# Patient Record
Sex: Female | Born: 1940 | Race: Black or African American | Hispanic: No | Smoking: Former smoker
Health system: Southern US, Community
[De-identification: ages and names within clinical notes are randomized; demographics above are authoritative.]

## PROBLEM LIST (undated history)

## (undated) ENCOUNTER — Ambulatory Visit (HOSPITAL_COMMUNITY): Admission: EM

## (undated) DIAGNOSIS — F419 Anxiety disorder, unspecified: Secondary | ICD-10-CM

## (undated) DIAGNOSIS — M1990 Unspecified osteoarthritis, unspecified site: Secondary | ICD-10-CM

## (undated) DIAGNOSIS — K219 Gastro-esophageal reflux disease without esophagitis: Secondary | ICD-10-CM

## (undated) DIAGNOSIS — T7840XA Allergy, unspecified, initial encounter: Secondary | ICD-10-CM

## (undated) DIAGNOSIS — R12 Heartburn: Secondary | ICD-10-CM

## (undated) DIAGNOSIS — K449 Diaphragmatic hernia without obstruction or gangrene: Secondary | ICD-10-CM

## (undated) DIAGNOSIS — K635 Polyp of colon: Secondary | ICD-10-CM

## (undated) DIAGNOSIS — I10 Essential (primary) hypertension: Secondary | ICD-10-CM

## (undated) DIAGNOSIS — R140 Abdominal distension (gaseous): Secondary | ICD-10-CM

## (undated) DIAGNOSIS — E119 Type 2 diabetes mellitus without complications: Secondary | ICD-10-CM

## (undated) DIAGNOSIS — H269 Unspecified cataract: Secondary | ICD-10-CM

## (undated) DIAGNOSIS — E559 Vitamin D deficiency, unspecified: Secondary | ICD-10-CM

## (undated) DIAGNOSIS — K5730 Diverticulosis of large intestine without perforation or abscess without bleeding: Secondary | ICD-10-CM

## (undated) HISTORY — DX: Diaphragmatic hernia without obstruction or gangrene: K44.9

## (undated) HISTORY — PX: CATARACT EXTRACTION: SUR2

## (undated) HISTORY — DX: Allergy, unspecified, initial encounter: T78.40XA

## (undated) HISTORY — DX: Heartburn: R12

## (undated) HISTORY — DX: Unspecified osteoarthritis, unspecified site: M19.90

## (undated) HISTORY — PX: COLONOSCOPY: SHX174

## (undated) HISTORY — DX: Abdominal distension (gaseous): R14.0

## (undated) HISTORY — DX: Anxiety disorder, unspecified: F41.9

## (undated) HISTORY — PX: DILATION AND CURETTAGE OF UTERUS: SHX78

## (undated) HISTORY — DX: Vitamin D deficiency, unspecified: E55.9

## (undated) HISTORY — DX: Unspecified cataract: H26.9

## (undated) HISTORY — PX: POLYPECTOMY: SHX149

## (undated) HISTORY — DX: Polyp of colon: K63.5

## (undated) HISTORY — DX: Diverticulosis of large intestine without perforation or abscess without bleeding: K57.30

## (undated) HISTORY — DX: Gastro-esophageal reflux disease without esophagitis: K21.9

## (undated) HISTORY — DX: Type 2 diabetes mellitus without complications: E11.9

## (undated) HISTORY — DX: Essential (primary) hypertension: I10

---

## 1963-05-07 HISTORY — PX: OOPHORECTOMY: SHX86

## 1969-05-06 HISTORY — PX: OOPHORECTOMY: SHX86

## 1999-04-25 ENCOUNTER — Encounter: Admission: RE | Admit: 1999-04-25 | Discharge: 1999-04-25 | Payer: Self-pay | Admitting: Family Medicine

## 1999-04-25 ENCOUNTER — Encounter: Payer: Self-pay | Admitting: Family Medicine

## 2000-05-29 ENCOUNTER — Encounter: Admission: RE | Admit: 2000-05-29 | Discharge: 2000-05-29 | Payer: Self-pay | Admitting: Internal Medicine

## 2000-05-29 ENCOUNTER — Encounter: Payer: Self-pay | Admitting: Internal Medicine

## 2000-08-07 ENCOUNTER — Inpatient Hospital Stay: Admission: AD | Admit: 2000-08-07 | Discharge: 2000-08-07 | Payer: Self-pay | Admitting: Obstetrics & Gynecology

## 2001-03-16 ENCOUNTER — Other Ambulatory Visit: Admission: RE | Admit: 2001-03-16 | Discharge: 2001-03-16 | Payer: Self-pay | Admitting: *Deleted

## 2001-08-17 ENCOUNTER — Encounter: Payer: Self-pay | Admitting: Family Medicine

## 2001-08-17 ENCOUNTER — Encounter: Admission: RE | Admit: 2001-08-17 | Discharge: 2001-08-17 | Payer: Self-pay | Admitting: Family Medicine

## 2002-12-08 ENCOUNTER — Ambulatory Visit (HOSPITAL_COMMUNITY): Admission: RE | Admit: 2002-12-08 | Discharge: 2002-12-08 | Payer: Self-pay | Admitting: Family Medicine

## 2002-12-08 ENCOUNTER — Encounter: Payer: Self-pay | Admitting: Family Medicine

## 2003-12-15 ENCOUNTER — Ambulatory Visit (HOSPITAL_COMMUNITY): Admission: RE | Admit: 2003-12-15 | Discharge: 2003-12-15 | Payer: Self-pay | Admitting: Internal Medicine

## 2005-03-06 ENCOUNTER — Ambulatory Visit: Payer: Self-pay | Admitting: Family Medicine

## 2005-03-20 ENCOUNTER — Ambulatory Visit: Payer: Self-pay | Admitting: Family Medicine

## 2005-04-24 ENCOUNTER — Ambulatory Visit (HOSPITAL_COMMUNITY): Admission: RE | Admit: 2005-04-24 | Discharge: 2005-04-24 | Payer: Self-pay | Admitting: Family Medicine

## 2005-05-30 ENCOUNTER — Ambulatory Visit: Payer: Self-pay | Admitting: Family Medicine

## 2005-05-30 ENCOUNTER — Other Ambulatory Visit: Admission: RE | Admit: 2005-05-30 | Discharge: 2005-05-30 | Payer: Self-pay | Admitting: Family Medicine

## 2005-05-30 ENCOUNTER — Encounter: Payer: Self-pay | Admitting: Family Medicine

## 2005-08-08 ENCOUNTER — Ambulatory Visit: Payer: Self-pay | Admitting: Gastroenterology

## 2005-08-21 ENCOUNTER — Ambulatory Visit: Payer: Self-pay | Admitting: Gastroenterology

## 2005-08-21 ENCOUNTER — Encounter (INDEPENDENT_AMBULATORY_CARE_PROVIDER_SITE_OTHER): Payer: Self-pay | Admitting: Specialist

## 2005-11-21 ENCOUNTER — Emergency Department (HOSPITAL_COMMUNITY): Admission: EM | Admit: 2005-11-21 | Discharge: 2005-11-21 | Payer: Self-pay | Admitting: Emergency Medicine

## 2006-04-24 ENCOUNTER — Ambulatory Visit: Payer: Self-pay | Admitting: Family Medicine

## 2006-06-05 ENCOUNTER — Ambulatory Visit: Payer: Self-pay | Admitting: Family Medicine

## 2006-06-05 LAB — CONVERTED CEMR LAB
CO2: 27 meq/L (ref 19–32)
Chloride: 107 meq/L (ref 96–112)
Cholesterol: 190 mg/dL (ref 0–200)
Creatinine, Ser: 0.6 mg/dL (ref 0.4–1.2)
GFR calc Af Amer: 130 mL/min
GFR calc non Af Amer: 107 mL/min
LDL Cholesterol: 131 mg/dL — ABNORMAL HIGH (ref 0–99)
Sodium: 143 meq/L (ref 135–145)
Total CHOL/HDL Ratio: 4.8

## 2006-06-10 ENCOUNTER — Ambulatory Visit: Payer: Self-pay | Admitting: Family Medicine

## 2006-08-07 ENCOUNTER — Encounter: Payer: Self-pay | Admitting: Family Medicine

## 2006-08-07 ENCOUNTER — Ambulatory Visit: Payer: Self-pay | Admitting: Family Medicine

## 2006-08-07 ENCOUNTER — Other Ambulatory Visit: Admission: RE | Admit: 2006-08-07 | Discharge: 2006-08-07 | Payer: Self-pay | Admitting: Family Medicine

## 2006-08-07 LAB — CONVERTED CEMR LAB: Pap Smear: NORMAL

## 2006-08-13 ENCOUNTER — Ambulatory Visit (HOSPITAL_COMMUNITY): Admission: RE | Admit: 2006-08-13 | Discharge: 2006-08-13 | Payer: Self-pay | Admitting: Family Medicine

## 2006-10-03 ENCOUNTER — Telehealth (INDEPENDENT_AMBULATORY_CARE_PROVIDER_SITE_OTHER): Payer: Self-pay | Admitting: *Deleted

## 2006-10-07 ENCOUNTER — Encounter: Payer: Self-pay | Admitting: Family Medicine

## 2006-10-07 DIAGNOSIS — K5730 Diverticulosis of large intestine without perforation or abscess without bleeding: Secondary | ICD-10-CM | POA: Insufficient documentation

## 2006-10-07 DIAGNOSIS — Z8639 Personal history of other endocrine, nutritional and metabolic disease: Secondary | ICD-10-CM | POA: Insufficient documentation

## 2006-10-07 DIAGNOSIS — I10 Essential (primary) hypertension: Secondary | ICD-10-CM

## 2006-10-07 DIAGNOSIS — E669 Obesity, unspecified: Secondary | ICD-10-CM

## 2006-10-07 DIAGNOSIS — K5900 Constipation, unspecified: Secondary | ICD-10-CM | POA: Insufficient documentation

## 2006-10-07 DIAGNOSIS — E7800 Pure hypercholesterolemia, unspecified: Secondary | ICD-10-CM | POA: Insufficient documentation

## 2006-10-08 ENCOUNTER — Ambulatory Visit: Payer: Self-pay | Admitting: Family Medicine

## 2006-11-14 ENCOUNTER — Ambulatory Visit: Payer: Self-pay | Admitting: Family Medicine

## 2006-11-14 LAB — CONVERTED CEMR LAB
GFR calc Af Amer: 129 mL/min
GFR calc non Af Amer: 107 mL/min
Potassium: 4.4 meq/L (ref 3.5–5.1)
Sodium: 138 meq/L (ref 135–145)

## 2006-11-18 ENCOUNTER — Ambulatory Visit: Payer: Self-pay | Admitting: Family Medicine

## 2006-11-18 DIAGNOSIS — R252 Cramp and spasm: Secondary | ICD-10-CM

## 2006-12-02 ENCOUNTER — Encounter (INDEPENDENT_AMBULATORY_CARE_PROVIDER_SITE_OTHER): Payer: Self-pay | Admitting: *Deleted

## 2007-06-17 ENCOUNTER — Emergency Department (HOSPITAL_COMMUNITY): Admission: EM | Admit: 2007-06-17 | Discharge: 2007-06-17 | Payer: Self-pay | Admitting: Emergency Medicine

## 2007-06-17 ENCOUNTER — Encounter: Payer: Self-pay | Admitting: Family Medicine

## 2007-06-29 ENCOUNTER — Ambulatory Visit: Payer: Self-pay | Admitting: Family Medicine

## 2007-07-08 ENCOUNTER — Telehealth: Payer: Self-pay | Admitting: Family Medicine

## 2007-07-21 ENCOUNTER — Encounter: Payer: Self-pay | Admitting: Family Medicine

## 2007-07-21 ENCOUNTER — Telehealth: Payer: Self-pay | Admitting: Family Medicine

## 2007-07-22 ENCOUNTER — Ambulatory Visit: Payer: Self-pay | Admitting: Family Medicine

## 2007-07-24 LAB — CONVERTED CEMR LAB
CO2: 27 meq/L (ref 19–32)
Calcium: 9.5 mg/dL (ref 8.4–10.5)
Chloride: 108 meq/L (ref 96–112)
Creatinine, Ser: 0.7 mg/dL (ref 0.4–1.2)
GFR calc non Af Amer: 90 mL/min
Glucose, Bld: 97 mg/dL (ref 70–99)

## 2007-08-04 ENCOUNTER — Ambulatory Visit: Payer: Self-pay | Admitting: Family Medicine

## 2007-09-10 ENCOUNTER — Ambulatory Visit: Payer: Self-pay | Admitting: Family Medicine

## 2007-09-10 LAB — CONVERTED CEMR LAB
CO2: 26 meq/L (ref 19–32)
Calcium: 9.7 mg/dL (ref 8.4–10.5)
Chloride: 110 meq/L (ref 96–112)
Creatinine, Ser: 0.8 mg/dL (ref 0.4–1.2)
GFR calc non Af Amer: 77 mL/min
Potassium: 4.4 meq/L (ref 3.5–5.1)

## 2007-09-17 ENCOUNTER — Ambulatory Visit: Payer: Self-pay | Admitting: Family Medicine

## 2007-11-16 ENCOUNTER — Ambulatory Visit: Payer: Self-pay | Admitting: Family Medicine

## 2007-11-23 ENCOUNTER — Encounter (INDEPENDENT_AMBULATORY_CARE_PROVIDER_SITE_OTHER): Payer: Self-pay | Admitting: Internal Medicine

## 2007-12-02 ENCOUNTER — Encounter (INDEPENDENT_AMBULATORY_CARE_PROVIDER_SITE_OTHER): Payer: Self-pay | Admitting: *Deleted

## 2007-12-31 ENCOUNTER — Ambulatory Visit: Payer: Self-pay | Admitting: Family Medicine

## 2008-01-04 LAB — CONVERTED CEMR LAB
CO2: 26 meq/L (ref 19–32)
GFR calc non Af Amer: 89 mL/min
Hgb A1c MFr Bld: 6.2 % — ABNORMAL HIGH (ref 4.6–6.0)
Sodium: 143 meq/L (ref 135–145)

## 2008-01-05 HISTORY — PX: DILATION AND CURETTAGE OF UTERUS: SHX78

## 2008-01-07 ENCOUNTER — Ambulatory Visit: Payer: Self-pay | Admitting: Family Medicine

## 2008-01-28 ENCOUNTER — Encounter (INDEPENDENT_AMBULATORY_CARE_PROVIDER_SITE_OTHER): Payer: Self-pay | Admitting: Obstetrics and Gynecology

## 2008-01-28 ENCOUNTER — Ambulatory Visit (HOSPITAL_COMMUNITY): Admission: RE | Admit: 2008-01-28 | Discharge: 2008-01-28 | Payer: Self-pay | Admitting: Obstetrics and Gynecology

## 2008-03-15 ENCOUNTER — Ambulatory Visit: Payer: Self-pay | Admitting: Family Medicine

## 2008-03-15 DIAGNOSIS — R609 Edema, unspecified: Secondary | ICD-10-CM

## 2008-05-19 ENCOUNTER — Telehealth: Payer: Self-pay | Admitting: Family Medicine

## 2008-05-23 ENCOUNTER — Telehealth: Payer: Self-pay | Admitting: Family Medicine

## 2008-06-01 ENCOUNTER — Ambulatory Visit: Payer: Self-pay | Admitting: Family Medicine

## 2008-06-01 DIAGNOSIS — N959 Unspecified menopausal and perimenopausal disorder: Secondary | ICD-10-CM | POA: Insufficient documentation

## 2008-06-01 LAB — CONVERTED CEMR LAB
Alkaline Phosphatase: 67 units/L (ref 39–117)
BUN: 11 mg/dL (ref 6–23)
Basophils Relative: 1.6 % (ref 0.0–3.0)
Bilirubin, Direct: 0.1 mg/dL (ref 0.0–0.3)
CO2: 27 meq/L (ref 19–32)
Chloride: 110 meq/L (ref 96–112)
Cholesterol: 199 mg/dL (ref 0–200)
Eosinophils Relative: 1.6 % (ref 0.0–5.0)
GFR calc Af Amer: 108 mL/min
HDL: 47.2 mg/dL (ref >39.0)
Hgb A1c MFr Bld: 6.4 % — ABNORMAL HIGH (ref 4.6–6.0)
Lymphocytes Relative: 52.6 % — ABNORMAL HIGH (ref 12.0–46.0)
MCHC: 33.8 g/dL (ref 30.0–36.0)
MCV: 85.6 fL (ref 78.0–100.0)
Microalb, Ur: 2.3 mg/dL — ABNORMAL HIGH (ref 0.0–1.9)
Neutrophils Relative %: 33.7 % — ABNORMAL LOW (ref 43.0–77.0)
Platelets: 226 10*3/uL (ref 150–400)
RDW: 12.6 % (ref 11.5–14.6)
Total Bilirubin: 0.6 mg/dL (ref 0.3–1.2)
Total CHOL/HDL Ratio: 4.2
Triglycerides: 93 mg/dL (ref 0–149)
VLDL: 19 mg/dL (ref 0–40)
WBC: 5.9 10*3/uL (ref 4.5–10.5)

## 2008-06-02 LAB — CONVERTED CEMR LAB: Vit D, 1,25-Dihydroxy: 14 — ABNORMAL LOW (ref 30–89)

## 2008-06-08 ENCOUNTER — Ambulatory Visit: Payer: Self-pay | Admitting: Family Medicine

## 2008-06-08 DIAGNOSIS — E039 Hypothyroidism, unspecified: Secondary | ICD-10-CM | POA: Insufficient documentation

## 2008-06-09 ENCOUNTER — Telehealth: Payer: Self-pay | Admitting: Family Medicine

## 2008-06-10 ENCOUNTER — Telehealth: Payer: Self-pay | Admitting: Family Medicine

## 2008-06-13 ENCOUNTER — Encounter: Payer: Self-pay | Admitting: Family Medicine

## 2008-06-15 ENCOUNTER — Ambulatory Visit (HOSPITAL_COMMUNITY): Admission: RE | Admit: 2008-06-15 | Discharge: 2008-06-15 | Payer: Self-pay | Admitting: Family Medicine

## 2008-06-16 ENCOUNTER — Encounter (INDEPENDENT_AMBULATORY_CARE_PROVIDER_SITE_OTHER): Payer: Self-pay | Admitting: *Deleted

## 2008-06-22 ENCOUNTER — Telehealth: Payer: Self-pay | Admitting: Family Medicine

## 2008-07-14 ENCOUNTER — Encounter: Admission: RE | Admit: 2008-07-14 | Discharge: 2008-07-14 | Payer: Self-pay | Admitting: Surgery

## 2008-07-14 ENCOUNTER — Encounter: Payer: Self-pay | Admitting: Family Medicine

## 2008-07-21 ENCOUNTER — Ambulatory Visit: Payer: Self-pay | Admitting: Family Medicine

## 2008-08-09 ENCOUNTER — Emergency Department (HOSPITAL_COMMUNITY): Admission: EM | Admit: 2008-08-09 | Discharge: 2008-08-09 | Payer: Self-pay | Admitting: Physician Assistant

## 2008-10-19 ENCOUNTER — Ambulatory Visit: Payer: Self-pay | Admitting: Family Medicine

## 2008-10-20 LAB — CONVERTED CEMR LAB: Free T4: 0.8 ng/dL (ref 0.6–1.6)

## 2008-11-29 ENCOUNTER — Ambulatory Visit: Payer: Self-pay | Admitting: Family Medicine

## 2009-03-13 ENCOUNTER — Telehealth: Payer: Self-pay | Admitting: Family Medicine

## 2009-03-14 ENCOUNTER — Encounter: Payer: Self-pay | Admitting: Family Medicine

## 2009-03-15 ENCOUNTER — Ambulatory Visit: Payer: Self-pay | Admitting: Family Medicine

## 2009-03-15 DIAGNOSIS — K219 Gastro-esophageal reflux disease without esophagitis: Secondary | ICD-10-CM

## 2009-03-20 ENCOUNTER — Encounter: Payer: Self-pay | Admitting: Family Medicine

## 2009-03-28 ENCOUNTER — Telehealth: Payer: Self-pay | Admitting: Family Medicine

## 2009-03-28 LAB — CONVERTED CEMR LAB: 5-HIAA, 24 Hr Urine: 4.3 mg/(24.h) (ref <=6.0)

## 2009-06-22 ENCOUNTER — Telehealth: Payer: Self-pay | Admitting: Family Medicine

## 2009-12-12 ENCOUNTER — Encounter (INDEPENDENT_AMBULATORY_CARE_PROVIDER_SITE_OTHER): Payer: Self-pay | Admitting: *Deleted

## 2010-04-26 ENCOUNTER — Ambulatory Visit (HOSPITAL_COMMUNITY)
Admission: RE | Admit: 2010-04-26 | Discharge: 2010-04-26 | Payer: Self-pay | Source: Home / Self Care | Attending: Family Medicine | Admitting: Family Medicine

## 2010-04-26 LAB — HM MAMMOGRAPHY: HM Mammogram: NORMAL

## 2010-05-27 ENCOUNTER — Encounter: Payer: Self-pay | Admitting: Family Medicine

## 2010-06-05 NOTE — Letter (Signed)
Summary: Nadara Eaton letter  [facility]  [address] [place], [place] [address]   Phone: [phone]  Fax: [phone]       12/12/2009 MRN: [id number]  [facility] [address] [place], [place]  [address]  Dear Ms. [facility] [place] and [place] announce the retirement of Arta Silence, M.D., from full-time practice at the [facility] office effective November 02, 2009 and his plans of returning part-time.  It is important to Dr. Hetty Ely and to our practice that you understand that [facility] has seven physicians in our office for your health care needs.  We will continue to offer the same exceptional care that you have today.    Dr. Hetty Ely has spoken to many of you about his plans for retirement and returning part-time in the fall.   We will continue to work with you through the transition to schedule appointments for you in the office and meet the high standards that [facility] is committed to.   Again, it is with great pleasure that we share the news that Dr. Hetty Ely will return to [facility] in October of 2011 with a reduced schedule.    If you have any questions, or would like to request an appointment with one of our physicians, please call us at [phone] and press the option for Scheduling an appointment.  We take pleasure in providing you with excellent patient care and look forward to seeing you at your next office visit.  Our [facility] Physicians are:  Tillman Abide, M.D. Laurita Quint, M.D. Roxy Manns, M.D. Kerby Nora, M.D. Hannah Beat, M.D. Ruthe Mannan, M.D. We proudly welcomed Raechel Ache, M.D. and Eustaquio Boyden, M.D. to the practice in July/August 2011.  Sincerely,  [facility]

## 2010-06-05 NOTE — Progress Notes (Signed)
Summary: Order for back brace ([employer])  Phone Note From Other Clinic Call back at [phone]   [name]: [employer] Call For: Dr. Hetty Ely Summary of Call: Received fax form requesting back brace.  Spoke with patient and she says that she is aware of this as well as Dr. Hetty Ely.  Please advise.  Form in your IN box.    Also, there is another form for diabetic supplies.  Form in your IN box. Initial call taken by: Linde Gillis CMA [facility]),  June 22, 2009 9:41 AM  Follow-up for Phone Call        Diabetic supplies form signed. Back brace form requires her being seen. Follow-up by: Shaune Leeks MD,  June 22, 2009 5:27 PM  Additional Follow-up for Phone Call Additional follow up Details #1::        Left message for patient to return call to schedule appt to see Dr. Hetty Ely.  Form for diabetic supplies faxed. Additional Follow-up by: Linde Gillis CMA [facility]),  June 23, 2009 8:14 AM

## 2010-06-07 DIAGNOSIS — E7800 Pure hypercholesterolemia, unspecified: Secondary | ICD-10-CM

## 2010-06-07 DIAGNOSIS — E119 Type 2 diabetes mellitus without complications: Secondary | ICD-10-CM

## 2010-06-07 DIAGNOSIS — D518 Other vitamin B12 deficiency anemias: Secondary | ICD-10-CM

## 2010-06-07 DIAGNOSIS — K5730 Diverticulosis of large intestine without perforation or abscess without bleeding: Secondary | ICD-10-CM

## 2010-06-07 DIAGNOSIS — E039 Hypothyroidism, unspecified: Secondary | ICD-10-CM

## 2010-06-07 DIAGNOSIS — I10 Essential (primary) hypertension: Secondary | ICD-10-CM

## 2010-06-14 ENCOUNTER — Other Ambulatory Visit (INDEPENDENT_AMBULATORY_CARE_PROVIDER_SITE_OTHER): Payer: Medicare Other

## 2010-06-14 ENCOUNTER — Encounter: Payer: Self-pay | Admitting: Family Medicine

## 2010-06-14 ENCOUNTER — Other Ambulatory Visit: Payer: Self-pay | Admitting: Family Medicine

## 2010-06-14 ENCOUNTER — Encounter (INDEPENDENT_AMBULATORY_CARE_PROVIDER_SITE_OTHER): Payer: Self-pay | Admitting: *Deleted

## 2010-06-14 DIAGNOSIS — K5730 Diverticulosis of large intestine without perforation or abscess without bleeding: Secondary | ICD-10-CM

## 2010-06-14 DIAGNOSIS — E785 Hyperlipidemia, unspecified: Secondary | ICD-10-CM

## 2010-06-14 DIAGNOSIS — E559 Vitamin D deficiency, unspecified: Secondary | ICD-10-CM

## 2010-06-14 DIAGNOSIS — E119 Type 2 diabetes mellitus without complications: Secondary | ICD-10-CM

## 2010-06-14 DIAGNOSIS — I10 Essential (primary) hypertension: Secondary | ICD-10-CM

## 2010-06-14 DIAGNOSIS — E039 Hypothyroidism, unspecified: Secondary | ICD-10-CM

## 2010-06-14 DIAGNOSIS — E7800 Pure hypercholesterolemia, unspecified: Secondary | ICD-10-CM

## 2010-06-14 DIAGNOSIS — R609 Edema, unspecified: Secondary | ICD-10-CM

## 2010-06-14 LAB — BASIC METABOLIC PANEL
BUN: 12 mg/dL (ref 6–23)
CO2: 26 mEq/L (ref 19–32)
Calcium: 9.7 mg/dL (ref 8.4–10.5)
Chloride: 102 mEq/L (ref 96–112)
Creatinine, Ser: 0.7 mg/dL (ref 0.4–1.2)
GFR: 116.63 mL/min (ref >60.00)
Glucose, Bld: 95 mg/dL (ref 70–99)
Potassium: 4.6 mEq/L (ref 3.5–5.1)
Sodium: 137 mEq/L (ref 135–145)

## 2010-06-14 LAB — HEMOGLOBIN A1C: Hgb A1c MFr Bld: 6.5 % (ref 4.6–6.5)

## 2010-06-14 LAB — VITAMIN B12: Vitamin B-12: 370 pg/mL (ref 211–911)

## 2010-06-14 LAB — CBC WITH DIFFERENTIAL/PLATELET
Basophils Absolute: 0 10*3/uL (ref 0.0–0.1)
Eosinophils Relative: 1.4 % (ref 0.0–5.0)
HCT: 38.1 % (ref 36.0–46.0)
Hemoglobin: 12.9 g/dL (ref 12.0–15.0)
Lymphocytes Relative: 40.4 % (ref 12.0–46.0)
Monocytes Absolute: 0.7 10*3/uL (ref 0.1–1.0)
Monocytes Relative: 10 % (ref 3.0–12.0)
Neutro Abs: 3.5 10*3/uL (ref 1.4–7.7)
Neutrophils Relative %: 47.9 % (ref 43.0–77.0)
RBC: 4.45 Mil/uL (ref 3.87–5.11)

## 2010-06-14 LAB — MICROALBUMIN / CREATININE URINE RATIO: Microalb, Ur: 8.4 mg/dL — ABNORMAL HIGH (ref 0.0–1.9)

## 2010-06-14 LAB — LIPID PANEL
Triglycerides: 99 mg/dL (ref 0.0–149.0)
VLDL: 19.8 mg/dL (ref 0.0–40.0)

## 2010-06-14 LAB — HEPATIC FUNCTION PANEL
Albumin: 4.1 g/dL (ref 3.5–5.2)
Total Protein: 7.8 g/dL (ref 6.0–8.3)

## 2010-06-20 ENCOUNTER — Encounter (INDEPENDENT_AMBULATORY_CARE_PROVIDER_SITE_OTHER): Payer: Medicare Other | Admitting: Family Medicine

## 2010-06-20 ENCOUNTER — Encounter: Payer: Self-pay | Admitting: Family Medicine

## 2010-06-20 DIAGNOSIS — IMO0002 Reserved for concepts with insufficient information to code with codable children: Secondary | ICD-10-CM | POA: Insufficient documentation

## 2010-06-20 DIAGNOSIS — Z0000 Encounter for general adult medical examination without abnormal findings: Secondary | ICD-10-CM

## 2010-06-27 NOTE — Assessment & Plan Note (Signed)
Summary: CPX/ CLE   Vital Signs:  Patient profile:   70 year old female Weight:      201.50 pounds BMI:     35.82 Temp:     98.6 degrees F oral Pulse rate:   92 / minute Pulse rhythm:   regular BP sitting:   144 / 80  (left arm) Cuff size:   large  Vitals Entered By: Sydell Axon LPN (June 20, 2010 2:55 PM) CC: 30 Minute checkup, sees Dr. Rosemary Holms for her GYN care  Vision Screening:Left eye with correction: 20 / 25 Right eye with correction: 20 / 25 Both eyes with correction: 20 / 25       25db HL: Left  500 hz: 25db 1000 hz: 25db 2000 hz: 25db 4000 hz: 25db Right  500 hz: 25db 1000 hz: 25db 2000 hz: 25db 4000 hz: 25db    History of Present Illness: Pt here for Comp Exam. She feels well with no complaints. She has stomach problems which bother her. She has been on Nexium in the past. She drinks lots of coffee and we discussed the effects of this.  She is often having jittery with figetting and can't sit still. This is all the time.  She continues with lipoma of upper left shoulder...couldn't be done because of other things going on. She would still like to have that done. She had seen Dr Daphine Deutscher.  Preventive Screening-Counseling & Management  Alcohol-Tobacco     Alcohol drinks/day: 0     Smoking Status: quit     Year Quit: 1975     Pack years: 10     Passive Smoke Exposure: no  Caffeine-Diet-Exercise     Caffeine use/day: 3 or more.     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 Take 1 Capsule By Mouth Once A Day 2)  Norvasc 10 Mg Tabs (Amlodipine Besylate) .... Take 1 Tablet By Mouth At Night 3)  Cleansing Blend With Fiber .... Takes 1 Tablet By Mouth Four Times A Day 4)  Metoprolol Succinate 100 Mg Xr24h-Tab (Metoprolol Succinate) .... One Tab By Mouth At Night 5)  Vitamin D 1000 Unit Tabs (Cholecalciferol) .Marland Kitchen.. 1 Tablet Twice A Day By Mouth 6)  Effexor Xr 37.5 Mg Xr24h-Cap (Venlafaxine Hcl) .... One Tab By Mouth Once Daily  Allergies: 1)  Asa  Past History:  Past Medical History: Last updated: 10/07/2006 Diabetes mellitus, type II Diverticulosis, colon Hypertension  Past Surgical History: Last updated: 06/08/2008  NSVD x 5 1 Miscarriage    D&C R Ovary removed due to cysts 1965 Ovarian Cystectomy L ooporectomy  1971 Colonoscopy- polyps, benign, divertics (08/21/2005)      5 yrs D&C for Vaginal Bleeding (Dr Rosemary Holms)  01/2008                                Family History: Last updated: 06/20/2010 Father: dec 61 MI, HTN Mother: dec 80- mult. problems, HTN Siblings: 5 brothers living  1 deceased from GI Bleed due to  PUD , 1 sister  living 2 passed, 1 from pneumonia with ETOH, other ETOH  Social History: Last updated: 06/08/2008 Marital Status: Married lives w/husb Children: foster daughter Occupation: retired  Risk Factors: Alcohol Use: 0 (06/20/2010) Caffeine Use: 3 or more. (06/20/2010) Exercise: no (06/20/2010)  Risk Factors: Smoking Status: quit (06/20/2010) Passive Smoke Exposure: no (06/20/2010)  Family History: Father: dec 61 MI, HTN Mother: dec 6- mult. problems, HTN Siblings: 5 brothers living  1 deceased from GI Bleed due to  PUD , 1  sister  living 2 passed, 1 from pneumonia with ETOH, other ETOH  Social History: Caffeine use/day:  3 or more. Does Patient Exercise:  no  Review of Systems General:  Denies chills, fatigue, fever, sweats, weakness, and weight loss. Eyes:  Denies blurring, discharge, and eye pain. ENT:  Denies decreased hearing, earache, and ringing in ears. CV:  Complains of palpitations and swelling of feet; denies chest pain or discomfort, fainting, fatigue, shortness of breath with exertion, and swelling of hands. Resp:  Denies cough, shortness of breath, and wheezing. GI:  Complains of constipation and indigestion; denies abdominal pain, bloody stools, change in bowel habits, dark tarry stools, diarrhea, loss of appetite, nausea, vomiting, vomiting blood, and yellowish skin color. GU:  Complains of nocturia; denies discharge, dysuria, and urinary frequency. MS:  Complains of joint pain and muscle aches; denies low back pain, cramps, and stiffness; generally . Derm:  Denies dryness, itching, and rash. Neuro:  Denies numbness, poor balance, tingling, and tremors.  Physical Exam  General:  Well-developed,well-nourished,in no acute distress; alert,appropriate and cooperative throughout examination Head:  Normocephalic and atraumatic without obvious abnormalities. No apparent alopecia or balding. Sinuses NT. Eyes:  Conjunctiva clear bilaterally.  Ears:  External ear exam shows no significant lesions or deformities.  Otoscopic examination reveals clear canals, tympanic membranes are intact bilaterally without bulging, retraction, inflammation or discharge. Hearing is grossly normal bilaterally. Nose:  External nasal examination shows no deformity or inflammation. Nasal mucosa are pink and moist without lesions or exudates. Mouth:  Oral mucosa and oropharynx without lesions or exudates.  Teeth in good repair. Neck:  No deformities, masses, or tenderness noted. Chest Wall:  No deformities, masses, or  tenderness noted. Breasts:  Not done, sees Dr Billy Coast. Lungs:  Normal respiratory effort, chest expands symmetrically. Lungs are clear to auscultation, no crackles or wheezes. Heart:  Normal rate and regular rhythm. S1 and S2 normal without gallop, murmur, click, rub or other extra sounds. Abdomen:  Bowel sounds positive,abdomen soft and non-tender without masses, organomegaly or hernias noted. Rectal:  Not done, sees Dr Billy Coast. Genitalia:  Not done, sees Dr Billy Coast. Msk:  No deformity or scoliosis noted of thoracic or lumbar spine.   Pulses:  R and L carotid,radial,femoral,dorsalis pedis and posterior tibial pulses are full and equal bilaterally Extremities:  No clubbing, cyanosis, edema, or deformity noted with normal full range of motion of all joints.   Neurologic:  No cranial nerve deficits noted. Station and gait are normal. Sensory, motor and coordinative functions appear intact. Skin:  Shin lesion previously is resolved. 4cm soft mobile lesion at top of lewft shoulder mildly  increased in size. Cervical Nodes:  No lymphadenopathy noted Inguinal Nodes:  No significant adenopathy Psych:  Cognition and judgment appear intact. Alert and cooperative with normal attention span and concentration. No apparent delusions, illusions, hallucinations   Impression & Recommendations:  Problem # 1:  PREVENTIVE HEALTH CARE (ICD-V70.0) Assessment Comment Only  I have personally reviewed the Medicare Annual Wellness questionnaire and have noted 1.   The patient's medical and social history 2.   Their use of alcohol, tobacco or illicit drugs 3.   Their current medications and supplements 4.   The patient's functional ability including ADL's, fall risks, home safety risks and hearing or visual             impairment. 5.   Diet and physical activities 6.   Evidence for depression or mood disorders.  Orders: Medicare -1st Annual Wellness Visit [phone])  Problem # 2:  UNSPECIFIED VITAMIN D DEFICIENCY  (ICD-268.9) Assessment: Deteriorated Start Vit D 1000Iu two times a day or 2000Iu once daily. Will prob need 2000 two times a day eventually.  Problem # 3:  GERD (ICD-530.81) Assessment: Deteriorated  Will change to Nexium biut pot really needs to lose weight and avoid caffeine and tomato prods. Her updated medication list for this problem includes:    Prilosec 20 Mg Cpdr (Omeprazole) .Marland Kitchen... Take 1 capsule by mouth once a day    Nexium 20 Mg Cpdr (Esomeprazole magnesium) ..... One tab by mouth in am before eating  Diagnostics Reviewed:  Discussed lifestyle modifications, diet, antacids/medications, and preventive measures. Handout provided.   Problem # 4:  HYPOTHYROIDISM, BORDERLINE (ICD-244.9) Assessment: Unchanged  Stable nos.  Labs Reviewed: TSH: 1.90 (06/14/2010)    HgBA1c: 6.5 (06/14/2010) Chol: 211 (06/14/2010)   HDL: 54.10 (06/14/2010)   LDL: 133 (06/01/2008)   TG: 99.0 (06/14/2010)  Problem # 5:  LIPOMA LEFT LAT LOWER NECK (ICD-214.9) Assessment: Unchanged Will have pt seen by Dr Daphine Deutscher again. Orders: Surgical Referral (Surgery)  Problem # 6:  Hx of HYPERCHOLESTEROLEMIA (ICD-272.0) Assessment: Unchanged LDL slightly high so decrease fatty foods and exercise. Wt loss will help. Labs Reviewed: SGOT: 22 (06/14/2010)   SGPT: 21 (06/14/2010)   HDL:54.10 (06/14/2010), 47.2 (06/01/2008)  LDL:133 (06/01/2008), 131 (06/05/2006)  Chol:211 (06/14/2010), 199 (06/01/2008)  Trig:99.0 (06/14/2010), 93 (06/01/2008)  Problem # 7:  Hx of CONSTIPATION (ICD-564.00) Assessment: Unchanged Try Citrucel. May need probiotics.  Problem # 8:  HYPERTENSION (ICD-401.9) Assessment: Unchanged Needs to lose weight . Her updated medication list for this problem includes:    Norvasc 10 Mg Tabs (Amlodipine besylate) .Marland Kitchen... Take 1 tablet by mouth at night    Metoprolol Succinate 100 Mg Xr24h-tab (Metoprolol succinate) ..... One tab by mouth at night  BP today: 144/80 Prior BP: 128/68  (03/15/2009)  Labs Reviewed: K+: 4.6 (06/14/2010) Creat: : 0.7 (06/14/2010)   Chol: 211 (06/14/2010)   HDL: 54.10 (06/14/2010)   LDL: 133 (06/01/2008)   TG: 99.0 (06/14/2010)  Problem # 9:  DIABETES MELLITUS, TYPE II (ICD-250.00) Assessment: Unchanged  Adequate with curr lifestyle.  Labs Reviewed: Creat: 0.7 (06/14/2010)   Microalbumin: 34.0 (06/05/2006) Reviewed HgBA1c results: 6.5 (06/14/2010)  6.4 (06/01/2008)  Problem # 10:  AGITATION (ICD-307.9) Assessment: New Trial of sertraline. See back in 6 weeks. Orders: Prescription Created Electronically [phone])  Complete Medication List: 1)  Prilosec 20 Mg Cpdr (Omeprazole) .... Take 1 capsule by mouth once a day 2)  Norvasc 10 Mg Tabs (Amlodipine besylate) .... Take 1 tablet by mouth at night 3)  Cleansing Blend  With Fiber  .... Takes 1 tablet by mouth four times a day 4)  Metoprolol Succinate 100 Mg Xr24h-tab (Metoprolol succinate) .... One tab by mouth at night 5)  Nexium 20 Mg Cpdr (Esomeprazole magnesium) .... One tab by mouth in am before eating 6)  Sertraline Hcl 50 Mg Tabs (Sertraline hcl) .... Take 1/2 tab by mouth daily for one week then increase to whole tablet daily.  Other Orders: Audiometry [phone]) Vision Screening ([address])  Patient Instructions: 1)  Refer to Dr Daphine Deutscher for Lipoma. 2)  RTC 6 weeks to check anxiety and heartburn. Try to avoid caffeine and tomato prods as much as  3)  Try taking Citrucel one tsp in 8 oz water first thing in the AM daily.  4)  Needs diabetic eye exam and foot exam. 5)  Give Pneumovax next time.  6)  Discuss Zostavax next time. Prescriptions: METOPROLOL SUCCINATE 100 MG XR24H-TAB (METOPROLOL SUCCINATE) one tab by mouth at night  #90 Not Speci x 0   Entered and Authorized by:   Shaune Leeks MD   Signed by:   Shaune Leeks MD on 06/20/2010   Method used:   Faxed to ...       [facility] (mail-order)             , [place]         Ph: [phone]       Fax: [phone]   RxID:    [id number] NORVASC 10 MG TABS (AMLODIPINE BESYLATE) Take 1 tablet by mouth at night  #90 Tablet x 0   Entered and Authorized by:   Shaune Leeks MD   Signed by:   Shaune Leeks MD on 06/20/2010   Method used:   Faxed to ...       [facility] (mail-order)             , [place]         Ph: [phone]       Fax: [phone]   RxID:   [id number] SERTRALINE HCL 50 MG TABS (SERTRALINE HCL) take 1/2 tab by mouth daily for one week then increase to whole tablet daily.  #30 x 12   Entered and Authorized by:   Shaune Leeks MD   Signed by:   Shaune Leeks MD on 06/20/2010   Method used:   Electronically to        [facility]  [address] [id number]       [address]       [place], [place]  [address]       Ph: [phone] or [phone]       Fax: [phone]   RxID:   [id number] NEXIUM 20 MG CPDR (ESOMEPRAZOLE MAGNESIUM) one tab by mouth in AM before eating  #30 x 12   Entered and Authorized by:   Shaune Leeks MD   Signed by:   Shaune Leeks MD on 06/20/2010   Method used:   [facility]ically [facility]        [facility]  [address] [id number]       [address]       [place], [place]  [address]       Ph: [phone] or [phone]       Fax: [phone]   RxID:   [id number]    Orders Added: 1)  Audiometry [92552] 2)  Vision Screening [phone]  3)  Surgical Referral [Surgery] 4)  Est. Patient 65& > [99397] 5)  Medicare -1st Annual Wellness Visit [G0438] 6)  Prescription Created Electronically [G8553] 7)  Est. Patient Level III [14782]    Current Allergies (reviewed today): ASA

## 2010-07-17 ENCOUNTER — Ambulatory Visit (INDEPENDENT_AMBULATORY_CARE_PROVIDER_SITE_OTHER): Payer: Medicare Other

## 2010-07-17 ENCOUNTER — Telehealth: Payer: Self-pay | Admitting: Family Medicine

## 2010-07-17 ENCOUNTER — Encounter: Payer: Self-pay | Admitting: Family Medicine

## 2010-07-17 DIAGNOSIS — Z111 Encounter for screening for respiratory tuberculosis: Secondary | ICD-10-CM

## 2010-07-18 ENCOUNTER — Encounter: Payer: Self-pay | Admitting: Family Medicine

## 2010-07-19 ENCOUNTER — Ambulatory Visit: Payer: Medicare Other

## 2010-07-19 ENCOUNTER — Encounter (INDEPENDENT_AMBULATORY_CARE_PROVIDER_SITE_OTHER): Payer: Self-pay | Admitting: *Deleted

## 2010-07-24 NOTE — Letter (Signed)
Summary: Generic Letter  [facility]  [address] [place], [place] [address]   Phone: [phone]  Fax: [phone]    07/18/2010   To; [place]to Re[employer] Courts  Re; CARMIN ALVIDREZ     [address]     [place], [place]  [address]  [place]  This is a letter of excuse for the above named who is on a medication that tends to make the patient sleepy if she sits prolonged periods of time. She therefore is concerned she will have trouble staying awake during court proceedings. She desires to be excused from jury duty.   Sincerely,     Laurita Quint MD

## 2010-07-24 NOTE — Assessment & Plan Note (Signed)
Summary: tb skin test  Nurse Visit   Allergies: 1)  Asa  Immunizations Administered:  PPD Skin Test:    Vaccine Type: PPD    Site: left forearm    Mfr: Sanofi Pasteur    Dose: 0.1 ml    Route: ID    Given by: Mervin Hack CMA (AAMA)    Exp. Date: 07/18/2012    Lot #: [id number]  Orders Added: 1)  TB Skin Test [86580] 2)  Admin 1st Vaccine [id number]

## 2010-07-24 NOTE — Progress Notes (Signed)
Summary: wants letter for jury duty / nexium is too expensive   Phone Note Call from Patient   [name]: Patient Call For: Shaune Leeks MD Summary of Call: Patient says that nexium is too expensive and is asking if she can switch to something else. [facility] [address]   She also has jury duty on 07-30-10 and is asking for a letter to excuse her from this. She says that the zoloft  makes her sleepy and she will fall asleep.  Initial call taken by: Melody Comas,  July 17, 2010 2:14 PM  Follow-up for Phone Call        Ltr done. Med requested. Follow-up by: Shaune Leeks MD,  July 18, 2010 7:45 AM  Additional Follow-up for Phone Call Additional follow up Details #1::        Patient notified, letter up front for pick up.  Additional Follow-up by: Melody Comas,  July 18, 2010 8:46 AM    New/Updated Medications: PANTOPRAZOLE SODIUM 40 MG TBEC (PANTOPRAZOLE SODIUM) one tab by mouth 45 mins prior to eating Prescriptions: PANTOPRAZOLE SODIUM 40 MG TBEC (PANTOPRAZOLE SODIUM) one tab by mouth 45 mins prior to eating  #30 x 12   Entered and Authorized by:   Shaune Leeks MD   Signed by:   Shaune Leeks MD on 07/18/2010   Method used:   [facility]        [facility]  [address] [id number]       [address]       [place], [place]  [address]       Ph: [phone] or [phone]       Fax: [phone]   RxID:   [id number]

## 2010-08-02 NOTE — Assessment & Plan Note (Signed)
Summary: PPD to be read/ri  Nurse Visit  CC: TB Skini test recheck Comments The patient presented after 48 hours to check the injection site for positive or negative reaction.  Injection site examination: No firm bump forms at the test site. Slightly reddish appearance and diameter was smaller than 5mm.  Assessment & Plan : Negative TB skin test. Patient was counseled to call  if experience any irritation of site.Lewanda Rife LPN  July 19, 2010 11:57 AM    Allergies: 1)  Asa  PPD Results    Date of reading: 07/19/2010    Results: < 5mm    Interpretation: negative

## 2010-08-04 ENCOUNTER — Encounter: Payer: Self-pay | Admitting: Family Medicine

## 2010-08-09 ENCOUNTER — Ambulatory Visit: Payer: Medicare Other | Admitting: Family Medicine

## 2010-08-09 DIAGNOSIS — Z0289 Encounter for other administrative examinations: Secondary | ICD-10-CM

## 2010-08-26 ENCOUNTER — Other Ambulatory Visit: Payer: Self-pay | Admitting: Family Medicine

## 2010-09-18 NOTE — Op Note (Signed)
NAMECARDELIA, Cassandra Cooke             ACCOUNT NO.:  [id number]   MEDICAL RECORD NO.:  [id number]          PATIENT TYPE:  AMB   LOCATION:  SDC                           FACILITY:  [facility]   PHYSICIAN:  Lenoard Aden, M.D.DATE OF BIRTH:  1940/06/11   DATE OF PROCEDURE:  01/28/2008  DATE OF DISCHARGE:                               OPERATIVE REPORT   PREOPERATIVE DIAGNOSES:  Postmenopausal bleeding with large endometrial  polyp.   POSTOPERATIVE DIAGNOSIS:  Large endometrial polyp.   PROCEDURES:  Diagnostic hysteroscopy, dilation and curettage,  resectoscopic polypectomy.   SURGEON:  Lenoard Aden, MD   ANESTHESIA:  General.   BLOOD LOSS:  Less than 50 mL.   FLUID DEFICIT:  150 mL.   COMPLICATIONS:  None.   DRAINS:  None.   COUNTS:  Correct.   The patient to recovery room in good condition.   BRIEF OPERATIVE NOTE:  After being appraised the risks of anesthesia,  infection, bleeding, injury of abdominal organs, need for repair,  delayed versus immediate complications to include bowel and bladder  injury, inability to cure cancer.  The patient was brought to the  operating room, where she was administered a general anesthetic without  complications, prepped and draped in sterile fashion, catheterized till  the bladder was empty.  The feet were placed in the Yellofin stirrups.  Exam under anesthesia revealed a small mid positioned anteflexed uterus  and no adnexal masses.  Cervix was grasped using a single-tooth  tenaculum.  A dilute Pitressin solution placed 16 mL total at 3 and 9  o'clock at the cervicovaginal junction.  The cervix was easily dilated  up to #27 [facility] dilator.  Hysteroscope placed.  Visualization revealed a  large hemorrhagic endometrial polyp, which extended from the fundus all  the way occupying the entire endometrial cavity.  The posterior wall of  this was then reduced using the resectoscope and multiple passes and  then the base was identified and  resected and the entire polyp was  removed.  Good hemostasis was noted.  The polyp base was found to be  hemostatic.  No further bleeding was noted.  An endometrial curettage  was performed in the usual four-quadrant method using sharp curettage.  Specimen was collected.  Revisualization revealed an otherwise normal  endometrial cavity.  All instruments were removed.  Fluid deficit as  noted.  The patient tolerated the procedure well, and was awakened and  transferred to the recovery in good condition.      Lenoard Aden, M.D.  Electronically Signed     RJT/MEDQ  D:  01/28/2008  T:  01/28/2008  Job:  [id number]

## 2011-02-04 LAB — BASIC METABOLIC PANEL
BUN: 14
CO2: 24
Chloride: 104
GFR calc Af Amer: >60
Potassium: 4.5

## 2011-02-04 LAB — CBC
HCT: 42.9
Platelets: 265
RBC: 5.02
WBC: 6.3

## 2011-07-17 ENCOUNTER — Other Ambulatory Visit: Payer: Self-pay | Admitting: Internal Medicine

## 2011-07-17 DIAGNOSIS — K219 Gastro-esophageal reflux disease without esophagitis: Secondary | ICD-10-CM

## 2011-07-24 ENCOUNTER — Ambulatory Visit
Admission: RE | Admit: 2011-07-24 | Discharge: 2011-07-24 | Disposition: A | Discharge status: Home / Self Care | Payer: BC Managed Care – PPO | Source: Ambulatory Visit | Attending: Internal Medicine | Admitting: Internal Medicine

## 2011-07-24 DIAGNOSIS — K219 Gastro-esophageal reflux disease without esophagitis: Secondary | ICD-10-CM

## 2012-01-13 ENCOUNTER — Other Ambulatory Visit (HOSPITAL_COMMUNITY): Payer: Self-pay | Admitting: Internal Medicine

## 2012-01-13 DIAGNOSIS — Z1231 Encounter for screening mammogram for malignant neoplasm of breast: Secondary | ICD-10-CM

## 2012-01-23 ENCOUNTER — Ambulatory Visit (HOSPITAL_COMMUNITY)
Admission: RE | Admit: 2012-01-23 | Discharge: 2012-01-23 | Disposition: A | Discharge status: Home / Self Care | Payer: Medicare Other | Source: Ambulatory Visit | Attending: Internal Medicine | Admitting: Internal Medicine

## 2012-01-23 DIAGNOSIS — Z1231 Encounter for screening mammogram for malignant neoplasm of breast: Secondary | ICD-10-CM | POA: Insufficient documentation

## 2012-04-05 ENCOUNTER — Other Ambulatory Visit: Payer: Self-pay | Admitting: Family Medicine

## 2012-04-06 NOTE — Telephone Encounter (Signed)
Electronic refill request.  Patient has never seen you for an OV, only RNS.  Please advise.

## 2012-04-07 NOTE — Telephone Encounter (Signed)
Sent, needs CPE.   

## 2012-04-07 NOTE — Telephone Encounter (Signed)
Patient advised. CPE scheduled.  

## 2012-04-08 ENCOUNTER — Encounter: Payer: Self-pay | Admitting: Family Medicine

## 2012-04-08 ENCOUNTER — Ambulatory Visit (INDEPENDENT_AMBULATORY_CARE_PROVIDER_SITE_OTHER): Payer: Medicare Other | Admitting: Family Medicine

## 2012-04-08 ENCOUNTER — Ambulatory Visit: Payer: Medicare Other | Admitting: Family Medicine

## 2012-04-08 VITALS — BP 144/78 | HR 72 | Temp 98.6°F | Wt 198.2 lb

## 2012-04-08 DIAGNOSIS — E119 Type 2 diabetes mellitus without complications: Secondary | ICD-10-CM

## 2012-04-08 DIAGNOSIS — E7800 Pure hypercholesterolemia, unspecified: Secondary | ICD-10-CM

## 2012-04-08 DIAGNOSIS — I10 Essential (primary) hypertension: Secondary | ICD-10-CM

## 2012-04-08 DIAGNOSIS — E559 Vitamin D deficiency, unspecified: Secondary | ICD-10-CM

## 2012-04-08 DIAGNOSIS — E039 Hypothyroidism, unspecified: Secondary | ICD-10-CM

## 2012-04-08 DIAGNOSIS — K219 Gastro-esophageal reflux disease without esophagitis: Secondary | ICD-10-CM

## 2012-04-08 MED ORDER — OMEPRAZOLE 20 MG PO CPDR: 20.0000 mg | DELAYED_RELEASE_CAPSULE | Freq: Every day | ORAL | Status: DC

## 2012-04-08 NOTE — Assessment & Plan Note (Signed)
No meds.  Diet controlled, return for labs.  See notes when resulted. She agrees.  Already had a flu shot.

## 2012-04-08 NOTE — Assessment & Plan Note (Signed)
Return for labs.  See notes when resulted.  

## 2012-04-08 NOTE — Assessment & Plan Note (Signed)
Restart PPI, try to limit dose as much as feasible.

## 2012-04-08 NOTE — Assessment & Plan Note (Signed)
Return for labs.  See notes when resulted.

## 2012-04-08 NOTE — Progress Notes (Addendum)
H/o DM2 diet controlled, no meds. Due for labs.  Hasn't been checking sugar frequently.  No sx of hyper/hypoglycemia.   Hypertension:    Using medication without problems or lightheadedness: y Chest pain with exertion:no Edema: occ ankle edema Short of breath:no  GERD.  Had been on episodic PPI prev.  Asking about options.  Has been worse during high stress period at home.   H/o borderline hypothyroidism and due for f/u labs.  No neck mass.  No meds for replacement.   Anxiety.  Related to caring for husband with cancer.  Had been started on SSRI prev by Dr. Hetty Ely with relief of symptoms and would like to continue.  Has Hospice at home to help care for husband.    H/o low vit D and due for recheck.    Meds, vitals, and allergies reviewed.   PMH and SH reviewed  ROS: See HPI.  Otherwise negative.    GEN: nad, alert and oriented HEENT: mucous membranes moist NECK: supple w/o LA CV: rrr. PULM: ctab, no inc wob ABD: soft, +bs EXT: minimal ankle edema B SKIN: no acute rash

## 2012-04-08 NOTE — Assessment & Plan Note (Signed)
Reasonable control given her home situation.  Return for labs.  See notes when resulted.  Continue current meds.

## 2012-04-08 NOTE — Patient Instructions (Signed)
If you need med refills, notify the pharmacy.  Don't change your regular meds.  Take prilosec for the heartburn.  Come back for fasting labs.   If you can't come for the physical in 2/14, then call ahead and we'll reschedule.   Glad to see you.

## 2012-04-08 NOTE — Assessment & Plan Note (Signed)
Return for labs.  See notes when resulted. No TMG on exam.

## 2012-06-09 ENCOUNTER — Encounter: Payer: Medicare Other | Admitting: Family Medicine

## 2012-06-09 DIAGNOSIS — Z0289 Encounter for other administrative examinations: Secondary | ICD-10-CM

## 2012-07-06 ENCOUNTER — Encounter: Payer: Self-pay | Admitting: Gastroenterology

## 2012-07-16 ENCOUNTER — Ambulatory Visit (AMBULATORY_SURGERY_CENTER): Payer: Medicare HMO

## 2012-07-16 ENCOUNTER — Telehealth: Payer: Self-pay

## 2012-07-16 VITALS — Ht 63.0 in | Wt 194.4 lb

## 2012-07-16 DIAGNOSIS — Z860100 Personal history of colon polyps, unspecified: Secondary | ICD-10-CM

## 2012-07-16 DIAGNOSIS — Z800 Family history of malignant neoplasm of digestive organs: Secondary | ICD-10-CM

## 2012-07-16 DIAGNOSIS — Z1211 Encounter for screening for malignant neoplasm of colon: Secondary | ICD-10-CM

## 2012-07-16 DIAGNOSIS — K5900 Constipation, unspecified: Secondary | ICD-10-CM

## 2012-07-16 MED ORDER — NA SULFATE-K SULFATE-MG SULF 17.5-3.13-1.6 GM/177ML PO SOLN: 1.0000 | Freq: Once | ORAL | Status: DC

## 2012-07-16 NOTE — Telephone Encounter (Signed)
Called patient and left message to call our office.Cassandra Cooke

## 2012-07-16 NOTE — Telephone Encounter (Signed)
Bloating is common and does not necessarily mean there is need for EGD.  I am happy to see her to discuss her bloating in the office but would not schedule her directly for EGD.

## 2012-07-16 NOTE — Telephone Encounter (Signed)
Spoke with patient and informed her that Dr Christella Hartigan does not feel she would need a direct endoscopy at this time, but could schedule an office visit to discuss her bloating and other symptoms.Pt states she did not want to schedule an office visit, but will talk to Dr Christella Hartigan at her colonsocpy appt on 07/29/12.

## 2012-07-16 NOTE — Telephone Encounter (Signed)
Dr Christella Hartigan, This pt is in our office today for her pre-visit prior to her colonoscopy on 07/29/12. She states she has a history of a hiatal hernia and has bloating and is requesting to have an endoscopy done at the same time as her colonoscopy.. She has never had an endoscopy done before. Please advise. Thanks

## 2012-07-17 ENCOUNTER — Encounter: Payer: Self-pay | Admitting: Gastroenterology

## 2012-07-29 ENCOUNTER — Encounter: Payer: Self-pay | Admitting: Gastroenterology

## 2012-07-29 ENCOUNTER — Ambulatory Visit (AMBULATORY_SURGERY_CENTER): Payer: Medicare HMO | Admitting: Gastroenterology

## 2012-07-29 VITALS — BP 137/64 | HR 55 | Temp 97.9°F | Resp 16 | Ht 63.0 in | Wt 194.0 lb

## 2012-07-29 DIAGNOSIS — D126 Benign neoplasm of colon, unspecified: Secondary | ICD-10-CM

## 2012-07-29 DIAGNOSIS — Z860100 Personal history of colon polyps, unspecified: Secondary | ICD-10-CM

## 2012-07-29 DIAGNOSIS — Z800 Family history of malignant neoplasm of digestive organs: Secondary | ICD-10-CM

## 2012-07-29 DIAGNOSIS — K5730 Diverticulosis of large intestine without perforation or abscess without bleeding: Secondary | ICD-10-CM

## 2012-07-29 DIAGNOSIS — Z1211 Encounter for screening for malignant neoplasm of colon: Secondary | ICD-10-CM

## 2012-07-29 MED ORDER — SODIUM CHLORIDE 0.9 % IV SOLN: 500.0000 mL | INTRAVENOUS | Status: DC

## 2012-07-29 NOTE — Progress Notes (Signed)
Patient did not experience any of the following events: a burn prior to discharge; a fall within the facility; wrong site/side/patient/procedure/implant event; or a hospital transfer or hospital admission upon discharge from the facility. (G8907) Patient did not have preoperative order for IV antibiotic SSI prophylaxis. (G8918)  

## 2012-07-29 NOTE — Patient Instructions (Addendum)

## 2012-07-29 NOTE — Op Note (Signed)
[facility] [address] [place], [address]   COLONOSCOPY PROCEDURE REPORT  PATIENT: Cassandra Cooke, Cassandra Cooke.  MR#: [id number] BIRTHDATE: 08-Apr-1941 , 71  yrs. old GENDER: Female ENDOSCOPIST: Rachael Fee, MD PROCEDURE DATE:  07/29/2012 PROCEDURE:   Colonoscopy with snare polypectomy ASA CLASS:   Class II INDICATIONS:elevated risk screening, son with CRC in his 30s MEDICATIONS: Fentanyl 50 mcg IV, Versed 5 mg IV, and These medications were titrated to patient response per physician's verbal order  DESCRIPTION OF PROCEDURE:   After the risks benefits and alternatives of the procedure were thoroughly explained, informed consent was obtained.  A digital rectal exam revealed no abnormalities of the rectum.   The LB [id number]-[id number]  endoscope was introduced through the anus and advanced to the cecum, which was identified by both the appendix and ileocecal valve. No adverse events experienced.   The quality of the prep was good.  The instrument was then slowly withdrawn as the colon was fully examined.  COLON FINDINGS: One small polyp was found, removed and sent to pathology.  This was sessile, 3mm across, located in transverse segment, removed with cold snare.  There were multiple diverticulum in left colon.  The examination was otherwise normal.  Retroflexed views revealed no abnormalities. The time to cecum=4 minutes 55 seconds.  Withdrawal time=9 minutes 59 seconds.  The scope was withdrawn and the procedure completed. COMPLICATIONS: There were no complications.  ENDOSCOPIC IMPRESSION: One small polyp was found, removed and sent to pathology. There were multiple diverticulum in left colon. The examination was otherwise normal.  RECOMMENDATIONS: Given your significant family history of colon cancer, you should have a repeat colonoscopy in 5 years even if the polyp removed today is not precancerous.   You will receive a letter within 1-2 weeks with the  results of your biopsy as well as final recommendations.  Please call my office if you have not received a letter after 3 weeks.   eSigned:  Rachael Fee, MD 07/29/2012 11:23 AM

## 2012-07-30 ENCOUNTER — Telehealth: Payer: Self-pay | Admitting: *Deleted

## 2012-07-30 NOTE — Telephone Encounter (Signed)
Name identifier, left message, follow-up 

## 2012-08-04 ENCOUNTER — Encounter: Payer: Self-pay | Admitting: Gastroenterology

## 2013-01-25 ENCOUNTER — Ambulatory Visit: Payer: Medicare PPO

## 2013-01-25 ENCOUNTER — Ambulatory Visit (INDEPENDENT_AMBULATORY_CARE_PROVIDER_SITE_OTHER): Payer: Medicare PPO | Admitting: Emergency Medicine

## 2013-01-25 VITALS — BP 158/92 | HR 72 | Temp 98.0°F | Resp 18 | Ht 64.0 in | Wt 192.0 lb

## 2013-01-25 DIAGNOSIS — L0291 Cutaneous abscess, unspecified: Secondary | ICD-10-CM

## 2013-01-25 DIAGNOSIS — S81009A Unspecified open wound, unspecified knee, initial encounter: Secondary | ICD-10-CM

## 2013-01-25 DIAGNOSIS — Z23 Encounter for immunization: Secondary | ICD-10-CM

## 2013-01-25 DIAGNOSIS — M25551 Pain in right hip: Secondary | ICD-10-CM

## 2013-01-25 DIAGNOSIS — M25559 Pain in unspecified hip: Secondary | ICD-10-CM

## 2013-01-25 DIAGNOSIS — M79671 Pain in right foot: Secondary | ICD-10-CM

## 2013-01-25 DIAGNOSIS — L0390 Cellulitis, unspecified: Secondary | ICD-10-CM

## 2013-01-25 DIAGNOSIS — M79609 Pain in unspecified limb: Secondary | ICD-10-CM

## 2013-01-25 DIAGNOSIS — S91011A Laceration without foreign body, right ankle, initial encounter: Secondary | ICD-10-CM

## 2013-01-25 MED ORDER — SULFAMETHOXAZOLE-TRIMETHOPRIM 800-160 MG PO TABS: 1.0000 | ORAL_TABLET | Freq: Two times a day (BID) | ORAL | Status: DC

## 2013-01-25 NOTE — Patient Instructions (Addendum)

## 2013-01-25 NOTE — Progress Notes (Signed)
[facility] [address], [place] [address] [phone]  Date:  01/25/2013   Name:  Cassandra Cooke   DOB:  1941/01/30   MRN:  [id number]  PCP:  Dorrene German, MD    Chief Complaint: Foot Pain   History of Present Illness:  Cassandra Cooke is a 72 y.o. very pleasant female patient who presents with the following:  Larey Seat over a week ago and injured her right hip.  Has had some pain with ambulation since.  One week ago a glass vessel fell on her foot and ankle and she suffered multiple lacerations.  No treatment other than dressings.  Not current on tetanus.  No improvement with over the counter medications or other home remedies.  Pain with ambulation.  Denies other complaint or health concern today.   Patient Active Problem List   Diagnosis Date Noted  . AGITATION 06/20/2010  . UNSPECIFIED VITAMIN D DEFICIENCY 06/14/2010  . GERD 03/15/2009  . HYPOTHYROIDISM, BORDERLINE 06/08/2008  . UNSPECIFIED MENOPAUSAL&POSTMENOPAUSAL DISORDER 06/01/2008  . ANKLE EDEMA, CHRONIC 03/15/2008  . LEG CRAMPS 11/18/2006  . DIABETES MELLITUS, TYPE II 10/07/2006  . HYPERCHOLESTEROLEMIA 10/07/2006  . OBESITY 10/07/2006  . HYPERTENSION 10/07/2006  . DIVERTICULOSIS, COLON 10/07/2006  . CONSTIPATION 10/07/2006    Past Medical History  Diagnosis Date  . Diabetes mellitus type II     Borderline  . Diverticulosis of colon   . Hypertension   . Anxiety     during a period of social upheaval  . Heartburn   . Allergy   . Hiatal hernia   . Bloating   . Cataract     Past Surgical History  Procedure Laterality Date  . Vaginal delivery      NSVD x 5  . Dilation and curettage of uterus      1 miscarriage  . Oophorectomy  1965    right due to cysts  . Oophorectomy  1971    ovarian cystectomy left  . Dilation and curettage of uterus  01/2008    vaginal bleeding (Dr. Rosemary Holms)  . Colonoscopy    . Polypectomy    . Cataract extraction      Bil    History  Substance  Use Topics  . Smoking status: Former Smoker    Types: Cigarettes    Quit date: 07/16/1973  . Smokeless tobacco: Never Used     Comment: quit over 40 years ago  . Alcohol Use: No    Family History  Problem Relation Age of Onset  . Hypertension Mother   . Heart disease Father     MI  . Hypertension Father   . Ulcers Brother     PUD  . Alcohol abuse Sister   . Alcohol abuse Sister   . Colon cancer Son   . Breast cancer Neg Hx     Allergies  Allergen Reactions  . Aspirin     REACTION: GI upset    Medication list has been reviewed and updated.  Current Outpatient Prescriptions on File Prior to Visit  Medication Sig Dispense Refill  . amLODipine (NORVASC) 10 MG tablet TAKE 1 TABLET AT NIGHT  90 tablet  0  . esomeprazole (NEXIUM) 40 MG capsule Take 40 mg by mouth daily before breakfast.      . ibuprofen (ADVIL,MOTRIN) 400 MG tablet       . metoprolol (TOPROL-XL) 100 MG 24 hr tablet TAKE 1 TABLET AT NIGHT  90 tablet  0  .  omeprazole (PRILOSEC) 20 MG capsule Take 1-2 capsules (20-40 mg total) by mouth daily.  90 capsule  3  . sertraline (ZOLOFT) 50 MG tablet Take 50 mg by mouth as needed.       No current facility-administered medications on file prior to visit.    Review of Systems:  As per HPI, otherwise negative.    Physical Examination: Filed Vitals:   01/25/13 1120  BP: 158/92  Pulse: 72  Temp: 98 F (36.7 C)  Resp: 18   Filed Vitals:   01/25/13 1120  Height: 5\' 4"  (1.626 m)  Weight: 192 lb (87.091 kg)   Body mass index is 32.94 kg/(m^2). Ideal Body Weight: Weight in (lb) to have BMI = 25: 145.3  GEN: WDWN, NAD, Non-toxic, A & O x 3 HEENT: Atraumatic, Normocephalic. Neck supple. No masses, No LAD. Ears and Nose: No external deformity. CV: RRR, No M/G/R. No JVD. No thrill. No extra heart sounds. PULM: CTA B, no wheezes, crackles, rhonchi. No retractions. No resp. distress. No accessory muscle use. ABD: S, NT, ND, +BS. No rebound. No HSM. EXTR: No  c/c/e NEURO Normal gait.  PSYCH: Normally interactive. Conversant. Not depressed or anxious appearing.  Calm demeanor.  RIGHT FOOT:  Some tenderness foot with swelling.  Mild erythema.  Three corner laceration right ankle laterally and a second laceration lateral shin.  NATI.  No fb.    Assessment and Plan: Cellulitis Laceration ankle and lower leg Tetanus Septra Xray foot and hip.  Signed,  Phillips Odor, MD   [facility] reading (PRIMARY) by  Dr. Dareen Piano.  Hip negative.  [facility] reading (PRIMARY) by  Dr. Dareen Piano.  Ankle negative.

## 2013-02-01 ENCOUNTER — Ambulatory Visit (INDEPENDENT_AMBULATORY_CARE_PROVIDER_SITE_OTHER): Payer: Medicare PPO | Admitting: Emergency Medicine

## 2013-02-01 VITALS — BP 158/86 | HR 63 | Temp 98.3°F | Resp 16 | Ht 64.0 in | Wt 192.0 lb

## 2013-02-01 DIAGNOSIS — IMO0002 Reserved for concepts with insufficient information to code with codable children: Secondary | ICD-10-CM

## 2013-02-01 DIAGNOSIS — S91011S Laceration without foreign body, right ankle, sequela: Secondary | ICD-10-CM

## 2013-02-01 NOTE — Patient Instructions (Addendum)

## 2013-02-01 NOTE — Progress Notes (Signed)
[facility] [address], [place] [address] [phone]  Date:  02/01/2013   Name:  Cassandra Cooke   DOB:  04-13-1941   MRN:  [id number]  PCP:  Dorrene German, MD    Chief Complaint: Follow-up   History of Present Illness:  Cassandra Cooke is a 72 y.o. very pleasant female patient who presents with the following:  Follow up on lacerations ankle.  No drainage.  Less swelling and tenderness.  No fever or chills.  No improvement with over the counter medications or other home remedies. Denies other complaint or health concern today.   Patient Active Problem List   Diagnosis Date Noted  . AGITATION 06/20/2010  . UNSPECIFIED VITAMIN D DEFICIENCY 06/14/2010  . GERD 03/15/2009  . HYPOTHYROIDISM, BORDERLINE 06/08/2008  . UNSPECIFIED MENOPAUSAL&POSTMENOPAUSAL DISORDER 06/01/2008  . ANKLE EDEMA, CHRONIC 03/15/2008  . LEG CRAMPS 11/18/2006  . DIABETES MELLITUS, TYPE II 10/07/2006  . HYPERCHOLESTEROLEMIA 10/07/2006  . OBESITY 10/07/2006  . HYPERTENSION 10/07/2006  . DIVERTICULOSIS, COLON 10/07/2006  . CONSTIPATION 10/07/2006    Past Medical History  Diagnosis Date  . Diabetes mellitus type II     Borderline  . Diverticulosis of colon   . Hypertension   . Anxiety     during a period of social upheaval  . Heartburn   . Allergy   . Hiatal hernia   . Bloating   . Cataract     Past Surgical History  Procedure Laterality Date  . Vaginal delivery      NSVD x 5  . Dilation and curettage of uterus      1 miscarriage  . Oophorectomy  1965    right due to cysts  . Oophorectomy  1971    ovarian cystectomy left  . Dilation and curettage of uterus  01/2008    vaginal bleeding (Dr. Rosemary Holms)  . Colonoscopy    . Polypectomy    . Cataract extraction      Bil    History  Substance Use Topics  . Smoking status: Former Smoker    Types: Cigarettes    Quit date: 07/16/1973  . Smokeless tobacco: Never Used     Comment: quit over 40 years ago  .  Alcohol Use: No    Family History  Problem Relation Age of Onset  . Hypertension Mother   . Heart disease Father     MI  . Hypertension Father   . Ulcers Brother     PUD  . Alcohol abuse Sister   . Alcohol abuse Sister   . Colon cancer Son   . Breast cancer Neg Hx     Allergies  Allergen Reactions  . Aspirin     REACTION: GI upset    Medication list has been reviewed and updated.  Current Outpatient Prescriptions on File Prior to Visit  Medication Sig Dispense Refill  . amLODipine (NORVASC) 10 MG tablet TAKE 1 TABLET AT NIGHT  90 tablet  0  . esomeprazole (NEXIUM) 40 MG capsule Take 40 mg by mouth daily before breakfast.      . ibuprofen (ADVIL,MOTRIN) 400 MG tablet       . metoprolol (TOPROL-XL) 100 MG 24 hr tablet TAKE 1 TABLET AT NIGHT  90 tablet  0  . sulfamethoxazole-trimethoprim (BACTRIM DS,SEPTRA DS) 800-160 MG per tablet Take 1 tablet by mouth 2 (two) times daily.  20 tablet  0  . omeprazole (PRILOSEC) 20 MG capsule Take 1-2 capsules (20-40 mg total)  by mouth daily.  90 capsule  3  . sertraline (ZOLOFT) 50 MG tablet Take 50 mg by mouth as needed.       No current facility-administered medications on file prior to visit.    Review of Systems:  As per HPI, otherwise negative.    Physical Examination: Filed Vitals:   02/01/13 1133  BP: 158/86  Pulse: 63  Temp: 98.3 F (36.8 C)  Resp: 16   Filed Vitals:   02/01/13 1133  Height: 5\' 4"  (1.626 m)  Weight: 192 lb (87.091 kg)   Body mass index is 32.94 kg/(m^2). Ideal Body Weight: Weight in (lb) to have BMI = 25: 145.3   GEN: WDWN, NAD, Non-toxic, Alert & Oriented x 3 HEENT: Atraumatic, Normocephalic.  Ears and Nose: No external deformity. EXTR: No clubbing/cyanosis/edema NEURO: Normal gait.  PSYCH: Normally interactive. Conversant. Not depressed or anxious appearing.  Calm demeanor.  RIGHT ankle: multiple lacerations.  No drainage or erytema.  Mild tenderness.    Assessment and Plan: Laceration  ankle Continue local wound care   Signed,  Phillips Odor, MD

## 2013-08-03 ENCOUNTER — Ambulatory Visit (INDEPENDENT_AMBULATORY_CARE_PROVIDER_SITE_OTHER): Payer: Medicare PPO | Admitting: Emergency Medicine

## 2013-08-03 VITALS — BP 180/100 | HR 56 | Temp 98.6°F | Resp 16 | Ht 63.0 in | Wt 190.0 lb

## 2013-08-03 DIAGNOSIS — I10 Essential (primary) hypertension: Secondary | ICD-10-CM

## 2013-08-03 DIAGNOSIS — M79671 Pain in right foot: Secondary | ICD-10-CM

## 2013-08-03 DIAGNOSIS — M79609 Pain in unspecified limb: Secondary | ICD-10-CM

## 2013-08-03 MED ORDER — CELECOXIB 200 MG PO CAPS: 200.0000 mg | ORAL_CAPSULE | Freq: Every day | ORAL | Status: DC

## 2013-08-03 MED ORDER — HYDROCHLOROTHIAZIDE 25 MG PO TABS: 25.0000 mg | ORAL_TABLET | Freq: Every day | ORAL | Status: DC

## 2013-08-03 NOTE — Patient Instructions (Signed)

## 2013-08-03 NOTE — Progress Notes (Signed)
[facility] [address] [place] [address] [phone]  Date:  08/03/2013   Name:  Cassandra Cooke   DOB:  1940-10-17   MRN:  [id number]  PCP:  Philis Fendt, MD    Chief Complaint: Ankle Pain   History of Present Illness:  Cassandra Cooke is a 73 y.o. very pleasant female patient who presents with the following:  Dropped a glass candle holder on her right foot last September and it broke, she was treated for lacerations at that time.  Comes now with complaint of intermittent but increasing pain in the lateral ankle and distal lower leg.  No swelling, redness or ecchymosis.  Says no predictable activities are related to pain nor relief from pain.  Under treatment for hypertension and has been compliant with medication.  No chest pain, neuro or visual symptoms.  Some global headache.  No improvement with over the counter medications or other home remedies. Denies other complaint or health concern today.   Patient Active Problem List   Diagnosis Date Noted  . AGITATION 06/20/2010  . UNSPECIFIED VITAMIN D DEFICIENCY 06/14/2010  . GERD 03/15/2009  . HYPOTHYROIDISM, BORDERLINE 06/08/2008  . UNSPECIFIED MENOPAUSAL&POSTMENOPAUSAL DISORDER 06/01/2008  . ANKLE EDEMA, CHRONIC 03/15/2008  . LEG CRAMPS 11/18/2006  . DIABETES MELLITUS, TYPE II 10/07/2006  . HYPERCHOLESTEROLEMIA 10/07/2006  . OBESITY 10/07/2006  . HYPERTENSION 10/07/2006  . DIVERTICULOSIS, COLON 10/07/2006  . CONSTIPATION 10/07/2006    Past Medical History  Diagnosis Date  . Diabetes mellitus type II     Borderline  . Diverticulosis of colon   . Hypertension   . Anxiety     during a period of social upheaval  . Heartburn   . Allergy   . Hiatal hernia   . Bloating   . Cataract     Past Surgical History  Procedure Laterality Date  . Vaginal delivery      NSVD x 5  . Dilation and curettage of uterus      1 miscarriage  . Oophorectomy  1965    right due to cysts  . Oophorectomy   1971    ovarian cystectomy left  . Dilation and curettage of uterus  01/2008    vaginal bleeding (Dr. Edwyna Ready)  . Colonoscopy    . Polypectomy    . Cataract extraction      Bil    History  Substance Use Topics  . Smoking status: Former Smoker    Types: Cigarettes    Quit date: 07/16/1973  . Smokeless tobacco: Never Used     Comment: quit over 40 years ago  . Alcohol Use: No    Family History  Problem Relation Age of Onset  . Hypertension Mother   . Heart disease Father     MI  . Hypertension Father   . Ulcers Brother     PUD  . Alcohol abuse Sister   . Alcohol abuse Sister   . Colon cancer Son   . Breast cancer Neg Hx     Allergies  Allergen Reactions  . Aspirin     REACTION: GI upset    Medication list has been reviewed and updated.  Current Outpatient Prescriptions on File Prior to Visit  Medication Sig Dispense Refill  . amLODipine (NORVASC) 10 MG tablet TAKE 1 TABLET AT NIGHT  90 tablet  0  . esomeprazole (NEXIUM) 40 MG capsule Take 40 mg by mouth daily before breakfast.      . metoprolol (  TOPROL-XL) 100 MG 24 hr tablet TAKE 1 TABLET AT NIGHT  90 tablet  0   No current facility-administered medications on file prior to visit.    Review of Systems:  As per HPI, otherwise negative.    Physical Examination: Filed Vitals:   08/03/13 1349  BP: 180/100  Pulse: 56  Temp: 98.6 F (37 C)  Resp: 16   Filed Vitals:   08/03/13 1349  Height: 5\' 3"  (1.6 m)  Weight: 190 lb (86.183 kg)   Body mass index is 33.67 kg/(m^2). Ideal Body Weight: Weight in (lb) to have BMI = 25: 140.8  GEN: WDWN, NAD, Non-toxic, A & O x 3 HEENT: Atraumatic, Normocephalic. Neck supple. No masses, No LAD. Ears and Nose: No external deformity. CV: RRR, No M/G/R. No JVD. No thrill. No extra heart sounds. PULM: CTA B, no wheezes, crackles, rhonchi. No retractions. No resp. distress. No accessory muscle use. ABD: S, NT, ND, +BS. No rebound. No HSM. EXTR: No c/c/e NEURO Normal  gait.  PSYCH: Normally interactive. Conversant. Not depressed or anxious appearing.  Calm demeanor.    Assessment and Plan: Hypertension Ankle pain Anaprox  HCTZ Follow up with regular MD this week   Signed,  Ellison Carwin, MD

## 2013-08-19 ENCOUNTER — Other Ambulatory Visit (HOSPITAL_COMMUNITY): Payer: Self-pay | Admitting: Internal Medicine

## 2013-08-19 DIAGNOSIS — Z1231 Encounter for screening mammogram for malignant neoplasm of breast: Secondary | ICD-10-CM

## 2013-08-27 ENCOUNTER — Ambulatory Visit (HOSPITAL_COMMUNITY)
Admission: RE | Admit: 2013-08-27 | Discharge: 2013-08-27 | Disposition: A | Discharge status: Home / Self Care | Payer: Medicare HMO | Source: Ambulatory Visit | Attending: Internal Medicine | Admitting: Internal Medicine

## 2013-08-27 DIAGNOSIS — Z1231 Encounter for screening mammogram for malignant neoplasm of breast: Secondary | ICD-10-CM | POA: Insufficient documentation

## 2013-09-15 ENCOUNTER — Emergency Department (HOSPITAL_COMMUNITY)
Admission: EM | Admit: 2013-09-15 | Discharge: 2013-09-15 | Disposition: A | Discharge status: Home / Self Care | Payer: Medicare HMO | Source: Home / Self Care | Attending: Emergency Medicine | Admitting: Emergency Medicine

## 2013-09-15 ENCOUNTER — Encounter (HOSPITAL_COMMUNITY): Payer: Self-pay | Admitting: Emergency Medicine

## 2013-09-15 ENCOUNTER — Emergency Department (INDEPENDENT_AMBULATORY_CARE_PROVIDER_SITE_OTHER): Payer: Medicare HMO

## 2013-09-15 DIAGNOSIS — M76899 Other specified enthesopathies of unspecified lower limb, excluding foot: Secondary | ICD-10-CM

## 2013-09-15 DIAGNOSIS — M7052 Other bursitis of knee, left knee: Secondary | ICD-10-CM

## 2013-09-15 MED ORDER — MELOXICAM 7.5 MG PO TABS: 7.5000 mg | ORAL_TABLET | Freq: Every day | ORAL | Status: DC

## 2013-09-15 NOTE — Discharge Instructions (Signed)
Bursitis Bursitis is a swelling and soreness (inflammation) of a fluid-filled sac (bursa) that overlies and protects a joint. It can be caused by injury, overuse of the joint, arthritis or infection. The joints most likely to be affected are the elbows, shoulders, hips and knees. HOME CARE INSTRUCTIONS   Apply ice to the affected area for 15-20 minutes each hour while awake for 2 days. Put the ice in a plastic bag and place a towel between the bag of ice and your skin.  Rest the injured joint as much as possible, but continue to put the joint through a full range of motion, 4 times per day. (The shoulder joint especially becomes rapidly "frozen" if not used.) When the pain lessens, begin normal slow movements and usual activities.  Only take over-the-counter or prescription medicines for pain, discomfort or fever as directed by your caregiver.  Your caregiver may recommend draining the bursa and injecting medicine into the bursa. This may help the healing process.  Follow all instructions for follow-up with your caregiver. This includes any orthopedic referrals, physical therapy and rehabilitation. Any delay in obtaining necessary care could result in a delay or failure of the bursitis to heal and chronic pain. SEEK IMMEDIATE MEDICAL CARE IF:   Your pain increases even during treatment.  You develop an oral temperature above 102 F (38.9 C) and have heat and inflammation over the involved bursa. MAKE SURE YOU:   Understand these instructions.  Will watch your condition.  Will get help right away if you are not doing well or get worse. Document Released: 04/19/2000 Document Revised: 07/15/2011 Document Reviewed: 03/24/2009 [employer] Patient Information 2014 [employer].  

## 2013-09-15 NOTE — ED Notes (Signed)
Given lg. Ice pack to go with instructions.

## 2013-09-15 NOTE — ED Provider Notes (Signed)
CSN: [id number]     Arrival date & time 09/15/13  0102 History   First MD Initiated Contact with Patient 09/15/13 1020     Chief Complaint  Patient presents with  . Knee Injury   (Consider location/radiation/quality/duration/timing/severity/associated sxs/prior Treatment) Patient is a 73 y.o. female presenting with knee pain. The history is provided by the patient.  Knee Pain Location:  Knee Time since incident:  3 weeks Injury: no   Knee location:  L knee Pain details:    Quality:  Aching and burning   Radiates to:  Does not radiate   Severity:  Moderate   Onset quality:  Gradual   Timing:  Constant   Progression:  Worsening Chronicity:  New Dislocation: no   Foreign body present:  No foreign bodies Prior injury to area:  No Relieved by:  Nothing Worsened by:  Bearing weight and activity Ineffective treatments:  None tried Associated symptoms: decreased ROM and swelling    Cassandra Cooke is a 73 y.o. female who presents to the ED with left knee pain. The pain started a few weeks ago. She has noted swelling and pain with ambulation. The knee is swollen and warm. She has not had this problem in the past. She denies fever or chills, red streaking or other problems.   Past Medical History  Diagnosis Date  . Diabetes mellitus type II     Borderline  . Diverticulosis of colon   . Hypertension   . Anxiety     during a period of social upheaval  . Heartburn   . Allergy   . Hiatal hernia   . Bloating   . Cataract    Past Surgical History  Procedure Laterality Date  . Vaginal delivery      NSVD x 5  . Dilation and curettage of uterus      1 miscarriage  . Oophorectomy  1965    right due to cysts  . Oophorectomy  1971    ovarian cystectomy left  . Dilation and curettage of uterus  01/2008    vaginal bleeding (Dr. Edwyna Ready)  . Colonoscopy    . Polypectomy    . Cataract extraction      Bil   Family History  Problem Relation Age of Onset  . Hypertension Mother   .  Heart disease Father     MI  . Hypertension Father   . Ulcers Brother     PUD  . Alcohol abuse Sister   . Alcohol abuse Sister   . Colon cancer Son   . Breast cancer Neg Hx    History  Substance Use Topics  . Smoking status: Former Smoker    Types: Cigarettes    Quit date: 07/16/1973  . Smokeless tobacco: Never Used     Comment: quit over 40 years ago  . Alcohol Use: No   OB History   Grav Para Term Preterm Abortions TAB SAB Ect Mult Living                 Review of Systems Normal except as stated in HPI  Allergies  Aspirin  Home Medications   Prior to Admission medications   Medication Sig Start Date End Date Taking? Authorizing Provider  amLODipine (NORVASC) 10 MG tablet TAKE 1 TABLET AT NIGHT 08/26/10   [place] Charon, MD  celecoxib (CELEBREX) 200 MG capsule Take 1 capsule (200 mg total) by mouth daily. 08/03/13   Ellison Carwin, MD  esomeprazole (NEXIUM) 40 MG capsule  Take 40 mg by mouth daily before breakfast.    Historical Provider, MD  hydrochlorothiazide (HYDRODIURIL) 25 MG tablet Take 1 tablet (25 mg total) by mouth daily. 08/03/13   Ellison Carwin, MD  metoprolol (TOPROL-XL) 100 MG 24 hr tablet TAKE 1 TABLET AT NIGHT 08/26/10   [place] Charon, MD   BP 158/86  Pulse 78  Temp(Src) 99.2 F (37.3 C) (Oral)  Resp 18  SpO2 16% Physical Exam  Nursing note and vitals reviewed. Constitutional: She is oriented to person, place, and time. She appears well-developed and well-nourished.  HENT:  Head: Normocephalic.  Eyes: Conjunctivae and EOM are normal.  Neck: Neck supple.  Cardiovascular: Normal rate.   Pulmonary/Chest: Effort normal.  Musculoskeletal:       Left knee: She exhibits swelling. She exhibits normal range of motion, no ecchymosis, no laceration, no erythema and normal alignment. Tenderness found.       Legs: Tender with palpation of the left anterior knee. Increased warmth, pain with flexion and extension. Pedal pulse strong, adequate  circulation, good touch sensation. No calf pain. No posterior tibial pain.   Neurological: She is alert and oriented to person, place, and time. No cranial nerve deficit.  Skin: Skin is warm and dry.  Psychiatric: She has a normal mood and affect. Her behavior is normal.    ED Course  Procedures  Dg Knee Complete 4 Views Left  09/15/2013   CLINICAL DATA:  Left knee pain without injury.  EXAM: LEFT KNEE - COMPLETE 4+ VIEW  COMPARISON:  None.  FINDINGS: There is no evidence of fracture, dislocation, or joint effusion. Severe narrowing of patellofemoral space is noted with spurring noted superiorly. Mild narrowing of medial joint space is noted with osteophyte formation. Spurring of tibial spines is noted. Soft tissues are unremarkable.  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 No acute abnormality seen in the left knee.   Electronically Signed   By: Sabino Dick M.D.   On: 09/15/2013 12:32     MDM  73 y.o. female with left knee pain x 3 weeks. No injury. Will treat for bursitis and she will follow up with her PCP. Stable for discharge without neurovascular deficits. Knee brace applied and patient states it gives a lot of support and feels better. She will rest the area, apply ice and elevate. She will return as needed for any problems.    Medication List    TAKE these medications       meloxicam 7.5 MG tablet  Commonly known as:  MOBIC  Take 1 tablet (7.5 mg total) by mouth daily.      ASK your doctor about these medications       amLODipine 10 MG tablet  Commonly known as:  NORVASC  TAKE 1 TABLET AT NIGHT     celecoxib 200 MG capsule  Commonly known as:  CELEBREX  Take 1 capsule (200 mg total) by mouth daily.     esomeprazole 40 MG capsule  Commonly known as:  NEXIUM  Take 40 mg by mouth daily before breakfast.     hydrochlorothiazide 25 MG tablet  Commonly known as:  HYDRODIURIL  Take 1 tablet (25 mg  total) by mouth daily.     metoprolol succinate 100 MG 24 hr tablet  Commonly known as:  TOPROL-XL  TAKE 1 TABLET AT [place], NP 09/15/13 1324

## 2013-09-15 NOTE — ED Notes (Signed)
Pt  Reports  Symptoms  Of  An  Injury    To  l  Knee       X  2-3    Weeks        Ago       denys  Any  specefic     Injury  Has  Had similar problems    In past          She  Is  Able  To ambulate  On the  Affected  Knee

## 2013-09-18 NOTE — ED Provider Notes (Signed)
Medical screening examination/treatment/procedure(s) were performed by non-physician practitioner and as supervising physician I was immediately available for consultation/collaboration.  Philipp Deputy, M.D.  Harden Mo, MD 09/18/13 [phone]

## 2014-01-07 ENCOUNTER — Ambulatory Visit (INDEPENDENT_AMBULATORY_CARE_PROVIDER_SITE_OTHER): Payer: Medicare PPO | Admitting: Family Medicine

## 2014-01-07 ENCOUNTER — Encounter: Payer: Self-pay | Admitting: Family Medicine

## 2014-01-07 VITALS — BP 158/98 | HR 76 | Temp 98.5°F | Ht 62.25 in | Wt 190.0 lb

## 2014-01-07 DIAGNOSIS — I10 Essential (primary) hypertension: Secondary | ICD-10-CM

## 2014-01-07 DIAGNOSIS — Z0000 Encounter for general adult medical examination without abnormal findings: Secondary | ICD-10-CM

## 2014-01-07 DIAGNOSIS — E7800 Pure hypercholesterolemia, unspecified: Secondary | ICD-10-CM

## 2014-01-07 DIAGNOSIS — Z23 Encounter for immunization: Secondary | ICD-10-CM

## 2014-01-07 DIAGNOSIS — R739 Hyperglycemia, unspecified: Secondary | ICD-10-CM

## 2014-01-07 DIAGNOSIS — Z7189 Other specified counseling: Secondary | ICD-10-CM

## 2014-01-07 DIAGNOSIS — E039 Hypothyroidism, unspecified: Secondary | ICD-10-CM

## 2014-01-07 DIAGNOSIS — R609 Edema, unspecified: Secondary | ICD-10-CM

## 2014-01-07 DIAGNOSIS — E119 Type 2 diabetes mellitus without complications: Secondary | ICD-10-CM

## 2014-01-07 DIAGNOSIS — E669 Obesity, unspecified: Secondary | ICD-10-CM

## 2014-01-07 DIAGNOSIS — E559 Vitamin D deficiency, unspecified: Secondary | ICD-10-CM

## 2014-01-07 DIAGNOSIS — R7309 Other abnormal glucose: Secondary | ICD-10-CM

## 2014-01-07 DIAGNOSIS — Z780 Asymptomatic menopausal state: Secondary | ICD-10-CM

## 2014-01-07 LAB — LIPID PANEL
CHOL/HDL RATIO: 4
Cholesterol: 186 mg/dL (ref 0–200)
HDL: 46.1 mg/dL (ref >39.00)
LDL Cholesterol: 123 mg/dL — ABNORMAL HIGH (ref 0–99)
NONHDL: 139.9
Triglycerides: 84 mg/dL (ref 0.0–149.0)
VLDL: 16.8 mg/dL (ref 0.0–40.0)

## 2014-01-07 LAB — HEMOGLOBIN A1C: HEMOGLOBIN A1C: 6.3 % (ref 4.6–6.5)

## 2014-01-07 LAB — COMPREHENSIVE METABOLIC PANEL
ALT: 17 U/L (ref 0–35)
AST: 24 U/L (ref 0–37)
Albumin: 4 g/dL (ref 3.5–5.2)
Alkaline Phosphatase: 73 U/L (ref 39–117)
BILIRUBIN TOTAL: 0.6 mg/dL (ref 0.2–1.2)
BUN: 10 mg/dL (ref 6–23)
CO2: 23 meq/L (ref 19–32)
Calcium: 9.8 mg/dL (ref 8.4–10.5)
Chloride: 107 mEq/L (ref 96–112)
Creatinine, Ser: 0.7 mg/dL (ref 0.4–1.2)
GFR: 105.97 mL/min (ref >60.00)
Glucose, Bld: 79 mg/dL (ref 70–99)
Potassium: 3.8 mEq/L (ref 3.5–5.1)
SODIUM: 139 meq/L (ref 135–145)
TOTAL PROTEIN: 8 g/dL (ref 6.0–8.3)

## 2014-01-07 LAB — TSH: TSH: 0.43 u[IU]/mL (ref 0.35–4.50)

## 2014-01-07 LAB — MICROALBUMIN / CREATININE URINE RATIO
Creatinine,U: 77.8 mg/dL
Microalb Creat Ratio: 4.6 mg/g (ref 0.0–30.0)
Microalb, Ur: 3.6 mg/dL — ABNORMAL HIGH (ref 0.0–1.9)

## 2014-01-07 MED ORDER — HYDROCHLOROTHIAZIDE 25 MG PO TABS: 25.0000 mg | ORAL_TABLET | Freq: Every day | ORAL | Status: DC

## 2014-01-07 MED ORDER — AMLODIPINE BESYLATE 10 MG PO TABS: 10.0000 mg | ORAL_TABLET | Freq: Every day | ORAL | Status: DC

## 2014-01-07 MED ORDER — METOPROLOL SUCCINATE ER 100 MG PO TB24: 100.0000 mg | ORAL_TABLET | Freq: Every day | ORAL | Status: DC

## 2014-01-07 NOTE — Progress Notes (Signed)
Pre visit review using our clinic review tool, if applicable. No additional management support is needed unless otherwise documented below in the visit note.  I have personally reviewed the Medicare Annual Wellness questionnaire and have noted 1. The patient's medical and social history 2. Their use of alcohol, tobacco or illicit drugs 3. Their current medications and supplements 4. The patient's functional ability including ADL's, fall risks, home safety risks and hearing or visual             impairment. 5. Diet and physical activities 6. Evidence for depression or mood disorders  The patients weight, height, BMI have been recorded in the chart and visual acuity is per eye clinic.  I have made referrals, counseling and provided education to the patient based review of the above and I have provided the pt with a written personalized care plan for preventive services.  Provider list updated- see scanned forms.  Routine anticipatory guidance given to patient.  See health maintenance.  Flu 2015 Shingles d/w pt.  PNA dw pt.  Tetanus 2014 at urgent care.  Colonoscopy 2014 Breast cancer screening 2014 DXA pending.  Advance directive d/w pt.  Daughter Lenor Coffin and son Cristela Felt equally designated if patient were incapacitated.  Cognitive function addressed- see scanned forms- and if abnormal then additional documentation follows.   H/o borderline DM2, due for labs.  Not checking sugar at home. See notes on labs.   Hypertension:    Using medication without problems or lightheadedness: yes Chest pain with exertion:no Edema:occ, when off HCTZ (off it now, but on CCB and BB) Short of breath:no Due for labs.  D/w pt.   PMH and SH reviewed  Meds, vitals, and allergies reviewed.   ROS: See HPI.  Otherwise negative.    GEN: nad, alert and oriented HEENT: mucous membranes moist NECK: supple w/o LA CV: rrr. PULM: ctab, no inc wob ABD: soft, +bs EXT: no edema SKIN: no acute  rash  Diabetic foot exam: Normal inspection No skin breakdown No calluses  Normal DP pulses Normal sensation to light touch and monofilament Nails normal

## 2014-01-07 NOTE — Patient Instructions (Addendum)
Go to the lab on the way out.  We'll contact you with your lab report. Cassandra Cooke will call about your referral for the bone density test.   Check with your insurance to see if they will cover the shingles shot. We'll get you pneumonia shot in the future.   Call about getting an eye exam.   Take care.  If you BP isn't <140/<90 when you start back on all of the BP meds, then let me know.  Glad to see you.

## 2014-01-10 ENCOUNTER — Other Ambulatory Visit: Payer: Self-pay | Admitting: Family Medicine

## 2014-01-10 DIAGNOSIS — R739 Hyperglycemia, unspecified: Secondary | ICD-10-CM

## 2014-01-10 DIAGNOSIS — Z7189 Other specified counseling: Secondary | ICD-10-CM | POA: Insufficient documentation

## 2014-01-10 DIAGNOSIS — Z0000 Encounter for general adult medical examination without abnormal findings: Secondary | ICD-10-CM | POA: Insufficient documentation

## 2014-01-10 NOTE — Assessment & Plan Note (Signed)
She'll add back on the HCTZ, see notes on labs. Continue as is for now o/w.

## 2014-01-10 NOTE — Assessment & Plan Note (Signed)
Hyperglycemia- not DM2 by A1c.  See notes on labs.

## 2014-01-10 NOTE — Assessment & Plan Note (Signed)
Flu 2015  Shingles d/w pt.  PNA dw pt.  Tetanus 2014 at urgent care.  Colonoscopy 2014  Breast cancer screening 2014  DXA pending.  Advance directive d/w pt. Daughter Lenor Coffin and son Cristela Felt equally designated if patient were incapacitated.  Cognitive function addressed- see scanned forms- and if abnormal then additional documentation follows.

## 2014-01-11 ENCOUNTER — Ambulatory Visit (INDEPENDENT_AMBULATORY_CARE_PROVIDER_SITE_OTHER)
Admission: RE | Admit: 2014-01-11 | Discharge: 2014-01-11 | Disposition: A | Discharge status: Home / Self Care | Payer: Medicare PPO | Source: Ambulatory Visit | Attending: Family Medicine | Admitting: Family Medicine

## 2014-01-11 DIAGNOSIS — Z780 Asymptomatic menopausal state: Secondary | ICD-10-CM

## 2014-01-13 ENCOUNTER — Encounter: Payer: Self-pay | Admitting: *Deleted

## 2014-01-14 ENCOUNTER — Other Ambulatory Visit: Payer: Self-pay | Admitting: Family Medicine

## 2014-01-14 DIAGNOSIS — E559 Vitamin D deficiency, unspecified: Secondary | ICD-10-CM

## 2014-01-16 ENCOUNTER — Encounter: Payer: Self-pay | Admitting: Family Medicine

## 2014-01-16 DIAGNOSIS — M8580 Other specified disorders of bone density and structure, unspecified site: Secondary | ICD-10-CM | POA: Insufficient documentation

## 2014-04-27 ENCOUNTER — Telehealth: Payer: Self-pay

## 2014-04-27 NOTE — Telephone Encounter (Signed)
Pt request bone density results from 01/11/14. Pt notified as instructed and pt voiced understanding. (could not get into result note).

## 2014-04-27 NOTE — Telephone Encounter (Signed)
Patient notified as instructed by telephone. Patient stated that she has decided to go to an Urgent Care today.

## 2014-04-27 NOTE — Telephone Encounter (Signed)
Needs OV, either here or UC.  Schedule if there is an open slot today or tomorrow.   Thanks.

## 2014-04-27 NOTE — Telephone Encounter (Signed)
PLEASE NOTE: All timestamps contained within this report are represented as [place] Standard Time. CONFIDENTIALTY NOTICE: This fax transmission is intended only for the addressee. It contains information that is legally privileged, confidential or otherwise protected from use or disclosure. If you are not the intended recipient, you are strictly prohibited from reviewing, disclosing, copying using or disseminating any of this information or taking any action in reliance on or regarding this information. If you have received this fax in error, please notify us immediately by telephone so that we can arrange for its return to [place]. Phone: [phone], Toll-Free: [phone], Fax: [phone] Page: 1 of 2 Call Id: [id number] [place] Patient Name: Cassandra Cooke Gender: Female DOB: 04-12-1941 Age: 73 Y 46 M 10 D Return Phone Number: [phone] (Primary) Address: [place]/State/Zip: [place] [address] Client [facility] - Client Client Site [facility] Contact Type Call Call Type Triage / Clinical Relationship To Patient Self Return Phone Number [phone] (Primary) Chief Complaint Cold Symptom Initial Comment [name] states has cold sx. Cough, when she does, chest hurts, usually gets z-pack. PreDisposition Did not know what to do Nurse Assessment Nurse: Loletta Specter, RN, Wells Guiles Date/Time Eilene Ghazi Time): 04/27/2014 10:06:42 AM Confirm and document reason for call. If symptomatic, describe symptoms. ---[name] states has cold sx. Cough, when she does, chest hurts when she coughs., usually gets z-pack. Denies SOb, no fever. Cough started last week. Has the patient traveled out of the country within the last 30 days? ---Not Applicable Does the patient require triage? ---Yes Related visit to physician within the last 2 weeks?  ---No Does the PT have any chronic conditions? (i.e. diabetes, asthma, etc.) ---No Guidelines Guideline Title Affirmed Question Affirmed Notes Nurse Date/Time Eilene Ghazi Time) Common Patsy Baltimore, RN, Wells Guiles 04/27/2014 10:07:32 AM Disp. Time Eilene Ghazi Time) Disposition Final User 04/27/2014 10:10:08 AM See Physician within 24 Hours Yes Loletta Specter, RN, Romualdo Bolk Understands: Yes Disagree/Comply: Comply Care Advice Given Per Guideline SEE PHYSICIAN WITHIN 24 HOURS: * IF OFFICE WILL BE OPEN: You need to be examined within the next 24 hours. Call your doctor when the office opens, and make an appointment. CALL BACK IF: * You become worse. CARE ADVICE given per [facility] (Adult) guideline. Warm transferred to appt line. PLEASE NOTE: All timestamps contained within this report are represented as [place] Standard Time. CONFIDENTIALTY NOTICE: This fax transmission is intended only for the addressee. It contains information that is legally privileged, confidential or otherwise protected from use or disclosure. If you are not the intended recipient, you are strictly prohibited from reviewing, disclosing, copying using or disseminating any of this information or taking any action in reliance on or regarding this information. If you have received this fax in error, please notify us immediately by telephone so that we can arrange for its return to [place]. Phone: [phone], Toll-Free: [phone], Fax: [phone] Page: 2 of 2 Call Id: [id number] After Care Instructions Given Call Event Type User Date / Time Description Referrals REFERRED TO PCP OFFICE

## 2014-07-08 ENCOUNTER — Other Ambulatory Visit: Payer: Medicare PPO

## 2014-07-12 ENCOUNTER — Encounter: Payer: Self-pay | Admitting: Family Medicine

## 2014-07-12 ENCOUNTER — Ambulatory Visit (INDEPENDENT_AMBULATORY_CARE_PROVIDER_SITE_OTHER): Payer: Medicare PPO | Admitting: Family Medicine

## 2014-07-12 VITALS — BP 118/80 | HR 59 | Temp 98.8°F | Wt 190.5 lb

## 2014-07-12 DIAGNOSIS — E119 Type 2 diabetes mellitus without complications: Secondary | ICD-10-CM

## 2014-07-12 DIAGNOSIS — I10 Essential (primary) hypertension: Secondary | ICD-10-CM

## 2014-07-12 DIAGNOSIS — R739 Hyperglycemia, unspecified: Secondary | ICD-10-CM

## 2014-07-12 DIAGNOSIS — E559 Vitamin D deficiency, unspecified: Secondary | ICD-10-CM

## 2014-07-12 LAB — HEMOGLOBIN A1C: HEMOGLOBIN A1C: 6.5 % (ref 4.6–6.5)

## 2014-07-12 LAB — MICROALBUMIN / CREATININE URINE RATIO
CREATININE, U: 136.4 mg/dL
MICROALB UR: 2 mg/dL — AB (ref 0.0–1.9)
Microalb Creat Ratio: 1.5 mg/g (ref 0.0–30.0)

## 2014-07-12 LAB — VITAMIN D 25 HYDROXY (VIT D DEFICIENCY, FRACTURES): VITD: 19.12 ng/mL — ABNORMAL LOW (ref 30.00–100.00)

## 2014-07-12 NOTE — Progress Notes (Signed)
Pre visit review using our clinic review tool, if applicable. No additional management support is needed unless otherwise documented below in the visit note.  Hypertension:    Using medication without problems or lightheadedness: yes Chest pain with exertion:no Edema:occ BLE edema, self resolves Short of breath:no Compliant with meds.    Hyperglycemia.  Due for f/u labs.  H/o elevated MALB, h/o DM2 in the past.   Occ with sx of hypoglycemia, resolved with eating.  Hasn't checked sugar at home recently.   She had prev DM2 education re: DM2 and foods.  Handout given.    Meds, vitals, and allergies reviewed.   PMH and SH reviewed  ROS: See HPI.  Otherwise negative.    GEN: nad, alert and oriented HEENT: mucous membranes moist NECK: supple w/o LA CV: rrr. PULM: ctab, no inc wob ABD: soft, +bs EXT: no edema SKIN: no acute rash

## 2014-07-12 NOTE — Patient Instructions (Addendum)
Go to the lab on the way out.  We'll contact you with your lab report. Take care.  Glad to see you.  Recheck in about 6 months at a physical (we may need to get together sooner, depending on your labs). Use the eat right diet.  Try to walk a little more in the meantime.

## 2014-07-13 NOTE — Assessment & Plan Note (Signed)
She has MALB pos, but minimally so.  Will recheck labs in about 3 months, will have her work on diet and weight in meantime and then we'll discuss at [facility].  See notes on labs.

## 2014-07-13 NOTE — Assessment & Plan Note (Signed)
Controlled, continue as is.  No change in meds.  D/w pt.  See notes on labs.  >25 minutes spent in face to face time with patient, >50% spent in counselling or coordination of care.

## 2014-07-14 ENCOUNTER — Other Ambulatory Visit: Payer: Self-pay | Admitting: Family Medicine

## 2014-07-14 ENCOUNTER — Telehealth: Payer: Self-pay

## 2014-07-14 DIAGNOSIS — E119 Type 2 diabetes mellitus without complications: Secondary | ICD-10-CM

## 2014-07-14 NOTE — Telephone Encounter (Signed)
-----   Message from Tonia Ghent, MD sent at 07/14/2014 12:01 AM EST ----- Call pt.  Still with DM2, doesn't need new meds.  Still with protein in urine, but improved from prev.  The main goal is working on [employer].  Plan on recheck labs in about 3 months, before OV, then we'll discuss at [place].  Thanks.  Orders are in.

## 2014-07-14 NOTE — Telephone Encounter (Signed)
Patient informed of lab results, recommendations and appointments.

## 2014-08-03 ENCOUNTER — Telehealth: Payer: Self-pay | Admitting: Family Medicine

## 2014-08-03 NOTE — Telephone Encounter (Signed)
Pt dropped off Medical Eval for [employer] to be filled out by PCP.  Put inbox. Pt would like a call back when ready for pickup.

## 2014-08-03 NOTE — Telephone Encounter (Signed)
Form done, please scan and have her pick up.  Thanks.

## 2014-08-03 NOTE — Telephone Encounter (Signed)
Form is on your desk to be completed.

## 2014-08-03 NOTE — Telephone Encounter (Signed)
Tried to call patient back at number list above, unable to do so because mail box was full. Will try again later to let patient know that form is ready for pickup.

## 2014-08-04 NOTE — Telephone Encounter (Signed)
Left detailed message on voicemail that form is ready for pickup but greeting did not match patient's name, ? Daughter's VM. Form left at front desk for pick up.

## 2014-09-08 DIAGNOSIS — Z961 Presence of intraocular lens: Secondary | ICD-10-CM | POA: Diagnosis not present

## 2014-09-08 DIAGNOSIS — E119 Type 2 diabetes mellitus without complications: Secondary | ICD-10-CM | POA: Diagnosis not present

## 2014-09-08 LAB — HM DIABETES EYE EXAM

## 2014-09-12 ENCOUNTER — Encounter: Payer: Self-pay | Admitting: Family Medicine

## 2015-01-06 ENCOUNTER — Other Ambulatory Visit: Payer: Medicare PPO

## 2015-01-12 ENCOUNTER — Ambulatory Visit (INDEPENDENT_AMBULATORY_CARE_PROVIDER_SITE_OTHER): Payer: Medicare PPO | Admitting: Family Medicine

## 2015-01-12 ENCOUNTER — Encounter: Payer: Self-pay | Admitting: Family Medicine

## 2015-01-12 VITALS — BP 120/68 | HR 55 | Temp 98.1°F | Wt 192.0 lb

## 2015-01-12 DIAGNOSIS — E119 Type 2 diabetes mellitus without complications: Secondary | ICD-10-CM | POA: Diagnosis not present

## 2015-01-12 DIAGNOSIS — R05 Cough: Secondary | ICD-10-CM

## 2015-01-12 DIAGNOSIS — E559 Vitamin D deficiency, unspecified: Secondary | ICD-10-CM | POA: Diagnosis not present

## 2015-01-12 DIAGNOSIS — R399 Unspecified symptoms and signs involving the genitourinary system: Secondary | ICD-10-CM | POA: Diagnosis not present

## 2015-01-12 DIAGNOSIS — K219 Gastro-esophageal reflux disease without esophagitis: Secondary | ICD-10-CM

## 2015-01-12 DIAGNOSIS — M25571 Pain in right ankle and joints of right foot: Secondary | ICD-10-CM | POA: Diagnosis not present

## 2015-01-12 DIAGNOSIS — Z23 Encounter for immunization: Secondary | ICD-10-CM | POA: Diagnosis not present

## 2015-01-12 DIAGNOSIS — I10 Essential (primary) hypertension: Secondary | ICD-10-CM

## 2015-01-12 DIAGNOSIS — R059 Cough, unspecified: Secondary | ICD-10-CM

## 2015-01-12 LAB — POCT URINALYSIS DIPSTICK
BILIRUBIN UA: NEGATIVE
Blood, UA: NEGATIVE
Glucose, UA: NEGATIVE
KETONES UA: NEGATIVE
LEUKOCYTES UA: NEGATIVE
NITRITE UA: NEGATIVE
PH UA: 6.5
PROTEIN UA: NEGATIVE
Spec Grav, UA: 1.025
Urobilinogen, UA: 0.2

## 2015-01-12 LAB — COMPREHENSIVE METABOLIC PANEL
ALT: 15 U/L (ref 0–35)
AST: 20 U/L (ref 0–37)
Albumin: 4.3 g/dL (ref 3.5–5.2)
Alkaline Phosphatase: 76 U/L (ref 39–117)
BUN: 11 mg/dL (ref 6–23)
CALCIUM: 10.1 mg/dL (ref 8.4–10.5)
CHLORIDE: 105 meq/L (ref 96–112)
CO2: 27 meq/L (ref 19–32)
Creatinine, Ser: 0.69 mg/dL (ref 0.40–1.20)
GFR: 107.43 mL/min (ref >60.00)
Glucose, Bld: 86 mg/dL (ref 70–99)
Potassium: 4.5 mEq/L (ref 3.5–5.1)
SODIUM: 139 meq/L (ref 135–145)
Total Bilirubin: 0.4 mg/dL (ref 0.2–1.2)
Total Protein: 8 g/dL (ref 6.0–8.3)

## 2015-01-12 LAB — LIPID PANEL
Cholesterol: 187 mg/dL (ref 0–200)
HDL: 50.2 mg/dL (ref >39.00)
LDL CALC: 116 mg/dL — AB (ref 0–99)
NONHDL: 136.78
TRIGLYCERIDES: 102 mg/dL (ref 0.0–149.0)
Total CHOL/HDL Ratio: 4
VLDL: 20.4 mg/dL (ref 0.0–40.0)

## 2015-01-12 LAB — MICROALBUMIN / CREATININE URINE RATIO
Creatinine,U: 60.5 mg/dL
MICROALB/CREAT RATIO: 2.3 mg/g (ref 0.0–30.0)
Microalb, Ur: 1.4 mg/dL (ref 0.0–1.9)

## 2015-01-12 LAB — HEMOGLOBIN A1C: Hgb A1c MFr Bld: 6.2 % (ref 4.6–6.5)

## 2015-01-12 LAB — VITAMIN D 25 HYDROXY (VIT D DEFICIENCY, FRACTURES): VITD: 20.29 ng/mL — ABNORMAL LOW (ref 30.00–100.00)

## 2015-01-12 MED ORDER — OMEPRAZOLE 40 MG PO CPDR: 40.0000 mg | DELAYED_RELEASE_CAPSULE | Freq: Every day | ORAL | Status: DC

## 2015-01-12 MED ORDER — HYDROCHLOROTHIAZIDE 25 MG PO TABS: 25.0000 mg | ORAL_TABLET | Freq: Every day | ORAL | Status: DC

## 2015-01-12 MED ORDER — AMLODIPINE BESYLATE 10 MG PO TABS: 10.0000 mg | ORAL_TABLET | Freq: Every day | ORAL | Status: DC

## 2015-01-12 MED ORDER — CELECOXIB 200 MG PO CAPS: 200.0000 mg | ORAL_CAPSULE | Freq: Every day | ORAL | Status: DC

## 2015-01-12 MED ORDER — METOPROLOL SUCCINATE ER 100 MG PO TB24: 100.0000 mg | ORAL_TABLET | Freq: Every day | ORAL | Status: DC

## 2015-01-12 NOTE — Progress Notes (Signed)
Pre visit review using our clinic review tool, if applicable. No additional management support is needed unless otherwise documented below in the visit note.  AMW tabled due to mult concerns.   Diabetes:  No meds Hypoglycemic episodes: no sx Hyperglycemic episodes: no sx Feet problems:no Blood Sugars averaging:not checked.  rx given to patient for meter and strips/lancets.  eye exam within last year:  Yes. Needs work on diet, d/w pt about low carb diet and suggestions for meals.  She is cooking for herself at home and that complicates her situation.   GERD.  "Lifelong", improved with prn PPI but nexium is too costly.  No fevers, no blood in stool.  Worse sx with fried foods.  occ bad taste in her mouth, with the worst sx.   Hypertension:    Using medication without problems or lightheadedness: yes Chest pain with exertion:no Edema:rare BLE, limited Short of breath:no  New R ankle pain.  H/o injury years ago.  More pain near the R lateral mal w/o trauma.  No medial or midfoot pain.    Cough.  New sx.  Recently noted.  Unclear if from GERD vs allergies.  Rare sputum.  Not SOB.  No fevers.    Urinary changes.  Odor occ, w/o burning or blood in urine.  No fevesr.    H/o vit D deficiency.  Due for recheck, labs pending.  Not on replacement currently.   PMH and SH reviewed  ROS: See HPI, otherwise noncontributory.  Meds, vitals, and allergies reviewed.   GEN: nad, alert and oriented HEENT: mucous membranes moist NECK: supple w/o LA CV: rrr. PULM: ctab, no inc wob ABD: soft, +bs EXT: no edema SKIN: no acute rash R lateral ankle tendon sheath ttp inferior to R lateral mal but not ttp at bony prominences.  Normal ankle ROM, mortise stable. Midfoot and medial ankle not ttp Normal DP pulse   Diabetic foot exam: Normal inspection No skin breakdown No calluses  Normal DP pulses Normal sensation to light touch and monofilament Nails normal

## 2015-01-12 NOTE — Patient Instructions (Signed)
Go to the lab on the way out.  We'll contact you with your lab report. Call about your mammogram.  They can get you scheduled.  Try prilosec for heartburn.  See if that helps.  If the cough continues, then you can try claritin 10mg  a day.  You can get it over the counter.   Check your sugar occasionally and try to work in some low sugar meals as we discussed.  We'll do your physical in about 6 months since we didn't get to it all today.  Wear tennis shoes and use an ice pack/bag your right ankle.  Take care.  Glad to see you.

## 2015-01-13 ENCOUNTER — Encounter: Payer: Self-pay | Admitting: Family Medicine

## 2015-01-13 DIAGNOSIS — M25569 Pain in unspecified knee: Secondary | ICD-10-CM | POA: Insufficient documentation

## 2015-01-13 DIAGNOSIS — R05 Cough: Secondary | ICD-10-CM | POA: Insufficient documentation

## 2015-01-13 DIAGNOSIS — R059 Cough, unspecified: Secondary | ICD-10-CM | POA: Insufficient documentation

## 2015-01-13 DIAGNOSIS — R399 Unspecified symptoms and signs involving the genitourinary system: Secondary | ICD-10-CM | POA: Insufficient documentation

## 2015-01-13 NOTE — Assessment & Plan Note (Signed)
See notes on labs. 

## 2015-01-13 NOTE — Assessment & Plan Note (Signed)
Controlled, continue meds as is, see notes on labs.  D/w pt about diet and weight.

## 2015-01-13 NOTE — Addendum Note (Signed)
Addended byCloyd Stagers B on: 01/13/2015 10:29 AM   Modules accepted: Orders

## 2015-01-13 NOTE — Assessment & Plan Note (Signed)
More likely gerd related, see above re: PPI and diet, okay for ouptatient f/u, update me if not better.  She agrees.

## 2015-01-13 NOTE — Assessment & Plan Note (Signed)
Likely just from concentration, d/w pt.  See notes on labs.

## 2015-01-13 NOTE — Assessment & Plan Note (Signed)
Change to prilosec 40mg  a day, daily dosing.  That should help, should be cheaper for patient.  Benign abx exam, can update me as needed.

## 2015-01-13 NOTE — Assessment & Plan Note (Signed)
Likely tendonitis, may improved with icing and more supportive footwear, see AVS.  D/w pt.  She agrees.  No need to image today.

## 2015-01-13 NOTE — Assessment & Plan Note (Signed)
D/w pt about DM2 diet, meal planning. See notes on labs.  rx given for meter and strips/lancets.

## 2015-02-09 ENCOUNTER — Other Ambulatory Visit: Payer: Self-pay | Admitting: Family Medicine

## 2015-05-20 ENCOUNTER — Ambulatory Visit (INDEPENDENT_AMBULATORY_CARE_PROVIDER_SITE_OTHER): Payer: Self-pay | Admitting: Emergency Medicine

## 2015-05-20 VITALS — BP 130/84 | HR 81 | Temp 98.2°F | Resp 16 | Ht 62.0 in | Wt 194.0 lb

## 2015-05-20 DIAGNOSIS — H8110 Benign paroxysmal vertigo, unspecified ear: Secondary | ICD-10-CM | POA: Diagnosis not present

## 2015-05-20 MED ORDER — MECLIZINE HCL 25 MG PO TABS: 25.0000 mg | ORAL_TABLET | Freq: Three times a day (TID) | ORAL | Status: DC | PRN

## 2015-05-20 NOTE — Progress Notes (Signed)
Subjective:  Patient ID: Cassandra Cooke, female    DOB: [birth year]  Age: 75 y.o. MRN: [id number]  CC: Abdominal Pain and Nausea   HPI ALYSON LYKKEN presents   Patient has a history of vertigo over the last week. She said it's worse when she changes position work or earns her head. No antecedent illness. She has no nasal congestion postnasal drainage. No sore throat. No fever chills.  She is nauseated times when she has the dizziness worsen. She's not vomited. She's had no diarrhea. She has no history of antecedent illness or head injury. No other neurologic or visual symptoms. She has no headache she's had similar symptoms previously.  History Mayeli has a past medical history of Diabetes mellitus type II; Diverticulosis of colon; Hypertension; Anxiety; Heartburn; Allergy; Hiatal hernia; Bloating; and Cataract.   She has past surgical history that includes Vaginal delivery; Dilation and curettage of uterus; Oophorectomy (1965); Oophorectomy (1971); Dilation and curettage of uterus (01/2008); Colonoscopy; Polypectomy; and Cataract extraction.   Her  family history includes Alcohol abuse in her sister and sister; Colon cancer in her son; Heart disease in her father; Hypertension in her father and mother; Ulcers in her brother. There is no history of Breast cancer.  She   reports that she quit smoking about 41 years ago. Her smoking use included Cigarettes. She has never used smokeless tobacco. She reports that she does not drink alcohol or use illicit drugs.  Outpatient Prescriptions Prior to Visit  Medication Sig Dispense Refill  . amLODipine (NORVASC) 10 MG tablet Take 1 tablet (10 mg total) by mouth daily. 90 tablet 3  . celecoxib (CELEBREX) 200 MG capsule Take 1 capsule (200 mg total) by mouth daily. 90 capsule 3  . hydrochlorothiazide (HYDRODIURIL) 25 MG tablet Take 1 tablet (25 mg total) by mouth daily. 90 tablet 3  . metoprolol succinate (TOPROL-XL) 100 MG 24 hr tablet Take  1 tablet (100 mg total) by mouth daily. 90 tablet 3  . omeprazole (PRILOSEC) 40 MG capsule Take 1 capsule (40 mg total) by mouth daily. 90 capsule 3  . amLODipine (NORVASC) 10 MG tablet TAKE 1 TABLET BY MOUTH EVERY DAY 90 tablet 3  . metoprolol succinate (TOPROL-XL) 100 MG 24 hr tablet TAKE 1 TABLET (100 MG TOTAL) BY MOUTH DAILY. 90 tablet 3   No facility-administered medications prior to visit.    Social History   Social History  . Marital Status: Widowed    Spouse Name: N/A  . Number of Children: N/A  . Years of Education: N/A   Occupational History  . Retired    Social History Main Topics  . Smoking status: Former Smoker    Types: Cigarettes    Quit date: 07/16/1973  . Smokeless tobacco: Never Used     Comment: quit over 40 years ago  . Alcohol Use: No  . Drug Use: No  . Sexual Activity: Not Asked   Other Topics Concern  . None   Social History Narrative   1st husband died of cancer.    Widowed 08/25/11, he had prostate cancer.    H/o mult foster children      1 daughter died of renal disease   1 son with h/o colon cancer   2 other sons and 1 daughter healthy     Review of Systems  Constitutional: Negative for fever, chills and appetite change.  HENT: Negative for congestion, ear pain, postnasal drip, sinus pressure and sore throat.   Eyes: Negative  for pain and redness.  Respiratory: Negative for cough, shortness of breath and wheezing.   Cardiovascular: Negative for leg swelling.  Gastrointestinal: Positive for nausea. Negative for vomiting, abdominal pain, diarrhea, constipation and blood in stool.  Endocrine: Negative for polyuria.  Genitourinary: Negative for dysuria, urgency, frequency and flank pain.  Musculoskeletal: Negative for gait problem.  Skin: Negative for rash.  Neurological: Positive for dizziness. Negative for weakness and headaches.  Psychiatric/Behavioral: Negative for confusion and decreased concentration. The patient is not nervous/anxious.      Objective:  BP 130/84 mmHg  Pulse 81  Temp(Src) 98.2 F (36.8 C) (Oral)  Resp 16  Ht 5\' 2"  (1.575 m)  Wt 194 lb (87.998 kg)  BMI 35.47 kg/m2  SpO2 98%  Physical Exam  Constitutional: She is oriented to person, place, and time. She appears well-developed and well-nourished. No distress.  HENT:  Head: Normocephalic and atraumatic.  Right Ear: External ear normal.  Left Ear: External ear normal.  Nose: Nose normal.  Eyes: Conjunctivae and EOM are normal. Pupils are equal, round, and reactive to light. No scleral icterus.  Neck: Normal range of motion. Neck supple. No tracheal deviation present.  Cardiovascular: Normal rate, regular rhythm and normal heart sounds.   Pulmonary/Chest: Effort normal. No respiratory distress. She has no wheezes. She has no rales.  Abdominal: She exhibits no mass. There is no tenderness. There is no rebound and no guarding.  Musculoskeletal: She exhibits no edema.  Lymphadenopathy:    She has no cervical adenopathy.  Neurological: She is alert and oriented to person, place, and time. Coordination normal.  Skin: Skin is warm and dry. No rash noted.  Psychiatric: She has a normal mood and affect. Her behavior is normal.      Assessment & Plan:   Gineen was seen today for abdominal pain and nausea.  Diagnoses and all orders for this visit:  Benign paroxysmal positional vertigo, unspecified laterality -     Ambulatory referral to ENT  Other orders -     meclizine (ANTIVERT) 25 MG tablet; Take 1 tablet (25 mg total) by mouth 3 (three) times daily as needed for dizziness.   I am having Ms. Polendo start on meclizine. I am also having her maintain her metoprolol succinate, hydrochlorothiazide, celecoxib, amLODipine, and omeprazole.  Meds ordered this encounter  Medications  . meclizine (ANTIVERT) 25 MG tablet    Sig: Take 1 tablet (25 mg total) by mouth 3 (three) times daily as needed for dizziness.    Dispense:  40 tablet    Refill:  0     Appropriate red flag conditions were discussed with the patient as well as actions that should be taken.  Patient expressed his understanding.  Follow-up: Return if symptoms worsen or fail to improve.  Roselee Culver, MD

## 2015-05-20 NOTE — Patient Instructions (Signed)
Benign Positional Vertigo Vertigo is the feeling that you or your surroundings are moving when they are not. Benign positional vertigo is the most common form of vertigo. The cause of this condition is not serious (is benign). This condition is triggered by certain movements and positions (is positional). This condition can be dangerous if it occurs while you are doing something that could endanger you or others, such as driving.  CAUSES In many cases, the cause of this condition is not known. It may be caused by a disturbance in an area of the inner ear that helps your brain to sense movement and balance. This disturbance can be caused by a viral infection (labyrinthitis), head injury, or repetitive motion. RISK FACTORS This condition is more likely to develop in:  Women.  People who are 50 years of age or older. SYMPTOMS Symptoms of this condition usually happen when you move your head or your eyes in different directions. Symptoms may start suddenly, and they usually last for less than a minute. Symptoms may include:  Loss of balance and falling.  Feeling like you are spinning or moving.  Feeling like your surroundings are spinning or moving.  Nausea and vomiting.  Blurred vision.  Dizziness.  Involuntary eye movement (nystagmus). Symptoms can be mild and cause only slight annoyance, or they can be severe and interfere with daily life. Episodes of benign positional vertigo may return (recur) over time, and they may be triggered by certain movements. Symptoms may improve over time. DIAGNOSIS This condition is usually diagnosed by medical history and a physical exam of the head, neck, and ears. You may be referred to a health care provider who specializes in ear, nose, and throat (ENT) problems (otolaryngologist) or a provider who specializes in disorders of the nervous system (neurologist). You may have additional testing, including:  MRI.  A CT scan.  Eye movement tests. Your  health care provider may ask you to change positions quickly while he or she watches you for symptoms of benign positional vertigo, such as nystagmus. Eye movement may be tested with an electronystagmogram (ENG), caloric stimulation, the Dix-Hallpike test, or the roll test.  An electroencephalogram (EEG). This records electrical activity in your brain.  Hearing tests. TREATMENT Usually, your health care provider will treat this by moving your head in specific positions to adjust your inner ear back to normal. Surgery may be needed in severe cases, but this is rare. In some cases, benign positional vertigo may resolve on its own in 2-4 weeks. HOME CARE INSTRUCTIONS Safety  Move slowly.Avoid sudden body or head movements.  Avoid driving.  Avoid operating heavy machinery.  Avoid doing any tasks that would be dangerous to you or others if a vertigo episode would occur.  If you have trouble walking or keeping your balance, try using a cane for stability. If you feel dizzy or unstable, sit down right away.  Return to your normal activities as told by your health care provider. Ask your health care provider what activities are safe for you. General Instructions  Take over-the-counter and prescription medicines only as told by your health care provider.  Avoid certain positions or movements as told by your health care provider.  Drink enough fluid to keep your urine clear or pale yellow.  Keep all follow-up visits as told by your health care provider. This is important. SEEK MEDICAL CARE IF:  You have a fever.  Your condition gets worse or you develop new symptoms.  Your family or friends   notice any behavioral changes.  Your nausea or vomiting gets worse.  You have numbness or a "pins and needles" sensation. SEEK IMMEDIATE MEDICAL CARE IF:  You have difficulty speaking or moving.  You are always dizzy.  You faint.  You develop severe headaches.  You have weakness in your  legs or arms.  You have changes in your hearing or vision.  You develop a stiff neck.  You develop sensitivity to light.   This information is not intended to replace advice given to you by your health care provider. Make sure you discuss any questions you have with your health care provider.   Document Released: 01/28/2006 Document Revised: 01/11/2015 Document Reviewed: 08/15/2014 Elsevier Interactive Patient Education 2016 [employer] Inc[employer]  

## 2015-05-22 ENCOUNTER — Telehealth: Payer: Self-pay

## 2015-05-22 NOTE — Telephone Encounter (Signed)
PLEASE NOTE: All timestamps contained within this report are represented as [place] Standard Time. CONFIDENTIALTY NOTICE: This fax transmission is intended only for the addressee. It contains information that is legally privileged, confidential or otherwise protected from use or disclosure. If you are not the intended recipient, you are strictly prohibited from reviewing, disclosing, copying using or disseminating any of this information or taking any action in reliance on or regarding this information. If you have received this fax in error, please notify us immediately by telephone so that we can arrange for its return to [place]. Phone: [phone], Toll-Free: [phone], Fax: [phone] Page: 1 of 2 Call Id: [id number] [place] Patient Name: Cassandra Cooke Gender: Female DOB: 1940-10-27 Age: 75 Y 35 M 1 D Return Phone Number: [phone] (Primary) Address: City/State/Zip: [place] Client [place] Night - Client Client Site [place] Physician Renford Dills Contact Type Call Call Type Triage / Clinical [name] Name Caylee Relationship To Patient Self Return Phone Number [phone] (Primary) Chief Complaint Dizziness Initial Comment [name] states she had a virus a couple weeks ago and now she has been dizzy and weak PreDisposition Home Care Nurse Assessment Nurse: Andreas Blower, RN, Lorriane Shire Date/Time (Eastern Time): 05/20/2015 12:17:51 PM Confirm and document reason for call. If symptomatic, describe symptoms. You must click the next button to save text entered. ---[name] states she had a virus a couple weeks ago and now she has been dizzy and weak. Has the patient traveled out of the country within the last 30 days? ---No Does the patient have any new or worsening symptoms? ---Yes Will a triage be completed? ---Yes Related visit to  physician within the last 2 weeks? ---Yes Does the PT have any chronic conditions? (i.e. diabetes, asthma, etc.) ---Yes List chronic conditions. ---high BP Is this a behavioral health or substance abuse call? ---No Guidelines Guideline Title Affirmed Question Affirmed Notes Nurse Date/Time (Eastern Time) Dizziness - Lightheadedness [1] Dizziness caused by heat exposure, sudden standing, or poor fluid intake AND [2] no improvement after 2 hours of rest and fluids Delamain, RN, Lorriane Shire 05/20/2015 12:19:54 PM Disp. Time Eilene Ghazi Time) Disposition Final User 05/20/2015 12:05:34 PM Attempt made - message left Delamain, RN, Lorriane Shire 05/20/2015 12:25:10 PM See Physician within 4 Hours (or PCP triage) Yes Delamain, RN, Lorriane Shire PLEASE NOTE: All timestamps contained within this report are represented as [place] Standard Time. CONFIDENTIALTY NOTICE: This fax transmission is intended only for the addressee. It contains information that is legally privileged, confidential or otherwise protected from use or disclosure. If you are not the intended recipient, you are strictly prohibited from reviewing, disclosing, copying using or disseminating any of this information or taking any action in reliance on or regarding this information. If you have received this fax in error, please notify us immediately by telephone so that we can arrange for its return to [place]. Phone: [phone], Toll-Free: [phone], Fax: [phone] Page: 2 of 2 Call Id: [id number] [place] Understands: Yes Disagree/Comply: Comply Care Advice Given Per Guideline SEE PHYSICIAN WITHIN 4 HOURS (or PCP triage): * IF OFFICE WILL BE CLOSED AND NO PCP TRIAGE: You need to be seen within the next 3 or 4 hours. A nearby Urgent Care Center is often a good source of care. Another choice is to go to the ER. Go sooner if you become worse. FLUIDS: Drink several glasses of fruit juice, other clear fluids or water. This will  improve hydration and  blood glucose. If the weather is hot, make sure the fluids are cold. REST: Lie down with feet elevated for 1 hour. This will improve circulation and increase blood flow to the brain. CALL BACK IF: * Passes out (faints) * You become worse. CARE ADVICE given per Dizziness (Adult) guideline. After Care Instructions Given Call Event Type User Date / Time Description Referrals GO TO FACILITY UNDECIDED GO TO FACILITY UNDECIDED

## 2015-05-22 NOTE — Telephone Encounter (Signed)
Pt was seen [facility] on 05/20/15.

## 2015-05-22 NOTE — Telephone Encounter (Signed)
Patient was diagnosed with vertigo at [facility].  Patient is not doing a lot better, has dizziness upon moving around or looking up.  She is okay when sitting still.  Along with the dizziness, her legs also hurt.

## 2015-05-22 NOTE — Telephone Encounter (Signed)
Please get update on patient.  Thanks. 

## 2015-05-22 NOTE — Telephone Encounter (Signed)
Patient advised.   Patient now states that she just picked up her medication because she didn't feel like getting it until now.  She will call for FU if not better in a few days.

## 2015-05-22 NOTE — Telephone Encounter (Signed)
Can she come in for a f/u if not better soon?  Thanks.

## 2015-07-18 ENCOUNTER — Encounter: Payer: Self-pay | Admitting: Family Medicine

## 2015-07-18 ENCOUNTER — Ambulatory Visit (INDEPENDENT_AMBULATORY_CARE_PROVIDER_SITE_OTHER): Payer: Medicare Other | Admitting: Family Medicine

## 2015-07-18 VITALS — BP 142/76 | HR 62 | Temp 98.4°F | Ht 62.0 in | Wt 196.5 lb

## 2015-07-18 DIAGNOSIS — I10 Essential (primary) hypertension: Secondary | ICD-10-CM | POA: Diagnosis not present

## 2015-07-18 DIAGNOSIS — R739 Hyperglycemia, unspecified: Secondary | ICD-10-CM

## 2015-07-18 DIAGNOSIS — Z0000 Encounter for general adult medical examination without abnormal findings: Secondary | ICD-10-CM

## 2015-07-18 DIAGNOSIS — M25571 Pain in right ankle and joints of right foot: Secondary | ICD-10-CM

## 2015-07-18 DIAGNOSIS — R7309 Other abnormal glucose: Secondary | ICD-10-CM

## 2015-07-18 DIAGNOSIS — E559 Vitamin D deficiency, unspecified: Secondary | ICD-10-CM

## 2015-07-18 DIAGNOSIS — R7989 Other specified abnormal findings of blood chemistry: Secondary | ICD-10-CM | POA: Diagnosis not present

## 2015-07-18 DIAGNOSIS — Z7189 Other specified counseling: Secondary | ICD-10-CM

## 2015-07-18 DIAGNOSIS — Z8639 Personal history of other endocrine, nutritional and metabolic disease: Secondary | ICD-10-CM | POA: Diagnosis not present

## 2015-07-18 LAB — LIPID PANEL
CHOLESTEROL: 185 mg/dL (ref 0–200)
HDL: 50.6 mg/dL (ref >39.00)
LDL CALC: 115 mg/dL — AB (ref 0–99)
NonHDL: 134.21
Total CHOL/HDL Ratio: 4
Triglycerides: 96 mg/dL (ref 0.0–149.0)
VLDL: 19.2 mg/dL (ref 0.0–40.0)

## 2015-07-18 LAB — COMPREHENSIVE METABOLIC PANEL
ALT: 19 U/L (ref 0–35)
AST: 22 U/L (ref 0–37)
Albumin: 4.3 g/dL (ref 3.5–5.2)
Alkaline Phosphatase: 79 U/L (ref 39–117)
BILIRUBIN TOTAL: 0.4 mg/dL (ref 0.2–1.2)
BUN: 13 mg/dL (ref 6–23)
CHLORIDE: 107 meq/L (ref 96–112)
CO2: 26 meq/L (ref 19–32)
CREATININE: 0.68 mg/dL (ref 0.40–1.20)
Calcium: 10 mg/dL (ref 8.4–10.5)
GFR: 109.1 mL/min (ref >60.00)
GLUCOSE: 95 mg/dL (ref 70–99)
Potassium: 4.5 mEq/L (ref 3.5–5.1)
Sodium: 141 mEq/L (ref 135–145)
Total Protein: 7.9 g/dL (ref 6.0–8.3)

## 2015-07-18 LAB — VITAMIN D 25 HYDROXY (VIT D DEFICIENCY, FRACTURES): VITD: 14.78 ng/mL — ABNORMAL LOW (ref 30.00–100.00)

## 2015-07-18 LAB — HEMOGLOBIN A1C: HEMOGLOBIN A1C: 6.4 % (ref 4.6–6.5)

## 2015-07-18 NOTE — Patient Instructions (Addendum)
Check with your insurance to see if they will cover the shingles shot. I would think about getting the pneumonia shot.  Go to the lab on the way out.  We'll contact you with your lab report. Take care.  Glad to see you.  Please plan on a follow up in about 6 months.  We can check your sugar at that visit, if needed.

## 2015-07-18 NOTE — Assessment & Plan Note (Signed)
Reasonable control, continue work on diet and exercise.  No change in meds.

## 2015-07-18 NOTE — Progress Notes (Signed)
Pre visit review using our clinic review tool, if applicable. No additional management support is needed unless otherwise documented below in the visit note.  I have personally reviewed the Medicare Annual Wellness questionnaire and have noted 1. The patient's medical and social history 2. Their use of alcohol, tobacco or illicit drugs 3. Their current medications and supplements 4. The patient's functional ability including ADL's, fall risks, home safety risks and hearing or visual             impairment. 5. Diet and physical activities 6. Evidence for depression or mood disorders  The patients weight, height, BMI have been recorded in the chart and visual acuity is per eye clinic.  I have made referrals, counseling and provided education to the patient based review of the above and I have provided the pt with a written personalized care plan for preventive services.  Provider list updated- see scanned forms.  Routine anticipatory guidance given to patient.  See health maintenance.  Flu 2016 Shingles d/w pt PNA d/w pt Tetanus 2014 Colonoscopy 2014 Breast cancer screening encouraged.  DXA prev done.   Advance directive- son [name] or daughter [name] equally designated if patient were incapacitated.   Cognitive function addressed- see scanned forms- and if abnormal then additional documentation follows.   Hypertension:    Using medication without problems or lightheadedness: yes Chest pain with exertion:no Edema:occ ankle edema Short of breath:no  Vit D def, due for recheck.  Not currently on replacement.  H/o osteopenia, d/w pt.   B ankle pain, likely OA, with movement.  Pain with walking.  Worse with weather changes.  Taking celebrex, but not often.   D/w pt about use.    H/o borderline sugar, due for A1c.  D/w pt about diet.  Encouraged healthy foods.  "I have a good appetite."   D/w pt about healthy options.   PMH and SH reviewed  Meds, vitals, and allergies reviewed.    ROS: See HPI.  Otherwise negative.    GEN: nad, alert and oriented HEENT: mucous membranes moist NECK: supple w/o LA CV: rrr. PULM: ctab, no inc wob ABD: soft, +bs EXT: no edema SKIN: no acute rash

## 2015-07-18 NOTE — Assessment & Plan Note (Signed)
D/w pt about diet and exercise, see notes on A1c pending.

## 2015-07-18 NOTE — Assessment & Plan Note (Signed)
Flu 2016 Shingles d/w pt PNA d/w pt Tetanus 2014 Colonoscopy 2014 Breast cancer screening encouraged.  DXA prev done.   Advance directive- son [name] or daughter [name] equally designated if patient were incapacitated.   Cognitive function addressed- see scanned forms- and if abnormal then additional documentation follows.

## 2015-07-18 NOTE — Assessment & Plan Note (Signed)
Encouraged to take celebrex more regularly, with tylenol if needed, avoid other nsaids.   She may need med more before weather changes

## 2015-07-18 NOTE — Assessment & Plan Note (Signed)
Level pending

## 2015-07-18 NOTE — Addendum Note (Signed)
Addended by: Daralene Milch C on: 07/18/2015 04:10 PM   Modules accepted: Miquel Dunn

## 2016-01-19 ENCOUNTER — Ambulatory Visit: Payer: Medicare Other | Admitting: Family Medicine

## 2016-01-19 DIAGNOSIS — Z0289 Encounter for other administrative examinations: Secondary | ICD-10-CM

## 2016-02-20 ENCOUNTER — Other Ambulatory Visit: Payer: Self-pay | Admitting: Family Medicine

## 2016-02-20 DIAGNOSIS — Z1231 Encounter for screening mammogram for malignant neoplasm of breast: Secondary | ICD-10-CM

## 2016-03-02 ENCOUNTER — Telehealth: Payer: Self-pay

## 2016-03-02 DIAGNOSIS — R739 Hyperglycemia, unspecified: Secondary | ICD-10-CM

## 2016-03-02 DIAGNOSIS — E559 Vitamin D deficiency, unspecified: Secondary | ICD-10-CM

## 2016-03-02 NOTE — Telephone Encounter (Signed)
Pt no showed. Should she reschedule?

## 2016-03-03 NOTE — Telephone Encounter (Signed)
Please but not for AMW visit.  Only needs routine 6 month f/u. Thanks.  Labs ahead of time if possible, nonfasting.  A1c and vit D ordered.

## 2016-03-05 ENCOUNTER — Ambulatory Visit
Admission: RE | Admit: 2016-03-05 | Discharge: 2016-03-05 | Disposition: A | Discharge status: Home / Self Care | Payer: Medicare Other | Source: Ambulatory Visit | Attending: Family Medicine | Admitting: Family Medicine

## 2016-03-05 DIAGNOSIS — Z1231 Encounter for screening mammogram for malignant neoplasm of breast: Secondary | ICD-10-CM

## 2016-03-07 ENCOUNTER — Encounter: Payer: Self-pay | Admitting: *Deleted

## 2016-05-09 ENCOUNTER — Other Ambulatory Visit: Payer: Self-pay | Admitting: Family Medicine

## 2016-05-23 ENCOUNTER — Other Ambulatory Visit: Payer: Self-pay | Admitting: Family Medicine

## 2016-07-29 ENCOUNTER — Ambulatory Visit (INDEPENDENT_AMBULATORY_CARE_PROVIDER_SITE_OTHER): Payer: Medicare Other

## 2016-07-29 ENCOUNTER — Other Ambulatory Visit: Payer: Self-pay | Admitting: Family Medicine

## 2016-07-29 VITALS — BP 132/74 | HR 57 | Temp 98.5°F | Ht 63.0 in | Wt 197.2 lb

## 2016-07-29 DIAGNOSIS — Z8639 Personal history of other endocrine, nutritional and metabolic disease: Secondary | ICD-10-CM | POA: Diagnosis not present

## 2016-07-29 DIAGNOSIS — E559 Vitamin D deficiency, unspecified: Secondary | ICD-10-CM | POA: Diagnosis not present

## 2016-07-29 DIAGNOSIS — R8290 Unspecified abnormal findings in urine: Secondary | ICD-10-CM | POA: Diagnosis not present

## 2016-07-29 DIAGNOSIS — Z0000 Encounter for general adult medical examination without abnormal findings: Secondary | ICD-10-CM | POA: Diagnosis not present

## 2016-07-29 DIAGNOSIS — R350 Frequency of micturition: Secondary | ICD-10-CM | POA: Diagnosis not present

## 2016-07-29 LAB — POC URINALSYSI DIPSTICK (AUTOMATED)
Bilirubin, UA: NEGATIVE
Blood, UA: NEGATIVE
Glucose, UA: NEGATIVE
Ketones, UA: NEGATIVE
LEUKOCYTES UA: NEGATIVE
Nitrite, UA: NEGATIVE
PH UA: 6 (ref 5.0–8.0)
PROTEIN UA: NEGATIVE
SPEC GRAV UA: 1.03 (ref 1.030–1.035)
UROBILINOGEN UA: 0.2 (ref ?–2.0)

## 2016-07-29 LAB — LIPID PANEL
Cholesterol: 189 mg/dL (ref 0–200)
HDL: 49.3 mg/dL (ref >39.00)
LDL Cholesterol: 122 mg/dL — ABNORMAL HIGH (ref 0–99)
NONHDL: 140.12
TRIGLYCERIDES: 93 mg/dL (ref 0.0–149.0)
Total CHOL/HDL Ratio: 4
VLDL: 18.6 mg/dL (ref 0.0–40.0)

## 2016-07-29 LAB — COMPREHENSIVE METABOLIC PANEL
ALT: 17 U/L (ref 0–35)
AST: 21 U/L (ref 0–37)
Albumin: 4.3 g/dL (ref 3.5–5.2)
Alkaline Phosphatase: 94 U/L (ref 39–117)
BUN: 12 mg/dL (ref 6–23)
CHLORIDE: 106 meq/L (ref 96–112)
CO2: 27 meq/L (ref 19–32)
Calcium: 9.9 mg/dL (ref 8.4–10.5)
Creatinine, Ser: 0.72 mg/dL (ref 0.40–1.20)
GFR: 101.85 mL/min (ref >60.00)
GLUCOSE: 90 mg/dL (ref 70–99)
POTASSIUM: 4.5 meq/L (ref 3.5–5.1)
Sodium: 140 mEq/L (ref 135–145)
Total Bilirubin: 0.3 mg/dL (ref 0.2–1.2)
Total Protein: 7.9 g/dL (ref 6.0–8.3)

## 2016-07-29 LAB — HEMOGLOBIN A1C: Hgb A1c MFr Bld: 6.3 % (ref 4.6–6.5)

## 2016-07-29 LAB — VITAMIN D 25 HYDROXY (VIT D DEFICIENCY, FRACTURES): VITD: 17.12 ng/mL — AB (ref 30.00–100.00)

## 2016-07-29 NOTE — Patient Instructions (Addendum)
Cassandra Cooke , Thank you for taking time to come for your Medicare Wellness Visit. I appreciate your ongoing commitment to your health goals. Please review the following plan we discussed and let me know if I can assist you in the future.   These are the goals we discussed: Goals    . Increase physical activity          Starting 07/29/2016, I will continue to exercise for at least 30 min 3-4 days per week.        This is a list of the screening recommended for you and due dates:  Health Maintenance  Topic Date Due  . Pneumonia vaccines (1 of 2 - PCV13) 09/03/2016*  . Complete foot exam   01/30/2017*  . Eye exam for diabetics  09/03/2016  . Hemoglobin A1C  01/29/2017  . Colon Cancer Screening  07/29/2017  . Mammogram  03/05/2018  . Tetanus Vaccine  01/26/2023  . Flu Shot  Addressed  . DEXA scan (bone density measurement)  Completed  *Topic was postponed. The date shown is not the original due date.      Preventive Care for Adults  A healthy lifestyle and preventive care can promote health and wellness. Preventive health guidelines for adults include the following key practices.  . A routine yearly physical is a good way to check with your health care provider about your health and preventive screening. It is a chance to share any concerns and updates on your health and to receive a thorough exam.  . Visit your dentist for a routine exam and preventive care every 6 months. Brush your teeth twice a day and floss once a day. Good oral hygiene prevents tooth decay and gum disease.  . The frequency of eye exams is based on your age, health, family medical history, use  of contact lenses, and other factors. Follow your health care provider's ecommendations for frequency of eye exams.  . Eat a healthy diet. Foods like vegetables, fruits, whole grains, low-fat dairy products, and lean protein foods contain the nutrients you need without too many calories. Decrease your intake of foods  high in solid fats, added sugars, and salt. Eat the right amount of calories for you. Get information about a proper diet from your health care provider, if necessary.  . Regular physical exercise is one of the most important things you can do for your health. Most adults should get at least 150 minutes of moderate-intensity exercise (any activity that increases your heart rate and causes you to sweat) each week. In addition, most adults need muscle-strengthening exercises on 2 or more days a week.  Silver Sneakers may be a benefit available to you. To determine eligibility, you may visit the website: www.silversneakers.com or contact program at 770-464-1668 Mon-Fri between 8AM-8PM.   . Maintain a healthy weight. The body mass index (BMI) is a screening tool to identify possible weight problems. It provides an estimate of body fat based on height and weight. Your health care provider can find your BMI and can help you achieve or maintain a healthy weight.   For adults 20 years and older: ? A BMI below 18.5 is considered underweight. ? A BMI of 18.5 to 24.9 is normal. ? A BMI of 25 to 29.9 is considered overweight. ? A BMI of 30 and above is considered obese.   . Maintain normal blood lipids and cholesterol levels by exercising and minimizing your intake of saturated fat. Eat a balanced diet with plenty  of fruit and vegetables. Blood tests for lipids and cholesterol should begin at age 12 and be repeated every 5 years. If your lipid or cholesterol levels are high, you are over 50, or you are at high risk for heart disease, you may need your cholesterol levels checked more frequently. Ongoing high lipid and cholesterol levels should be treated with medicines if diet and exercise are not working.  . If you smoke, find out from your health care provider how to quit. If you do not use tobacco, please do not start.  . If you choose to drink alcohol, please do not consume more than 2 drinks per day.  One drink is considered to be 12 ounces (355 mL) of beer, 5 ounces (148 mL) of wine, or 1.5 ounces (44 mL) of liquor.  . If you are 46-51 years old, ask your health care provider if you should take aspirin to prevent strokes.  . Use sunscreen. Apply sunscreen liberally and repeatedly throughout the day. You should seek shade when your shadow is shorter than you. Protect yourself by wearing long sleeves, pants, a wide-brimmed hat, and sunglasses year round, whenever you are outdoors.  . Once a month, do a whole body skin exam, using a mirror to look at the skin on your back. Tell your health care provider of new moles, moles that have irregular borders, moles that are larger than a pencil eraser, or moles that have changed in shape or color.

## 2016-07-29 NOTE — Progress Notes (Signed)
PCP notes:   Health maintenance:  Flu vaccine - per pt, administered in Jan 2018 A1C - completed Eye exam - per pt, completed in May 2017 PNA vaccine - postponed until May 2018   Abnormal screenings:   None  Patient concerns:   Pt has complaint of urinary frequency, urinary incontinence, and abnormal odor in urine. PCP notified. UA and C&S ordered. UA completed and results forwarded to PCP. Urine specimen sent to lab for culture.  Nurse concerns:  None  Next PCP appt:   08/08/16 @ 1215  I reviewed health advisor's note, was available for consultation on the day of service listed in this note, and agree with documentation and plan. Elsie Stain, MD.

## 2016-07-29 NOTE — Progress Notes (Signed)
Subjective:   Cassandra Cooke is a 76 y.o. female who presents for Medicare Annual (Subsequent) preventive examination.  Review of Systems:  N/A Cardiac Risk Factors include: advanced age (>68men, >41 women);obesity (BMI >30kg/m2);hypertension;dyslipidemia;diabetes mellitus     Objective:     Vitals: BP 132/74 (BP Location: Right Arm, Patient Position: Sitting, Cuff Size: Normal)   Pulse (!) 57   Temp 98.5 F (36.9 C) (Oral)   Ht 5\' 3"  (1.6 m) Comment: no shoes  Wt 197 lb 4 oz (89.5 kg)   SpO2 97%   BMI 34.94 kg/m   Body mass index is 34.94 kg/m.   Tobacco History  Smoking Status  . Former Smoker  . Types: Cigarettes  . Quit date: 07/16/1973  Smokeless Tobacco  . Never Used    Comment: quit over 40 years ago     Counseling given: No   Past Medical History:  Diagnosis Date  . Allergy   . Anxiety    during a period of social upheaval  . Bloating   . Cataract   . Diabetes mellitus type II    Borderline  . Diverticulosis of colon   . Heartburn   . Hiatal hernia   . Hypertension    Past Surgical History:  Procedure Laterality Date  . CATARACT EXTRACTION     Bil  . COLONOSCOPY    . DILATION AND CURETTAGE OF UTERUS     1 miscarriage  . DILATION AND CURETTAGE OF UTERUS  01/2008   vaginal bleeding (Dr. Ronita Hipps)  . OOPHORECTOMY  1965   right due to cysts  . OOPHORECTOMY  1971   ovarian cystectomy left  . POLYPECTOMY    . VAGINAL DELIVERY     NSVD x 5   Family History  Problem Relation Age of Onset  . Hypertension Mother   . Heart disease Father     MI  . Hypertension Father   . Ulcers Brother     PUD  . Alcohol abuse Sister   . Alcohol abuse Sister   . Colon cancer Son   . Breast cancer Neg Hx    History  Sexual Activity  . Sexual activity: No    Outpatient Encounter Prescriptions as of 07/29/2016  Medication Sig  . amLODipine (NORVASC) 10 MG tablet TAKE 1 TABLET (10 MG TOTAL) BY MOUTH DAILY.  . celecoxib (CELEBREX) 200 MG capsule Take  1 capsule (200 mg total) by mouth daily.  . meclizine (ANTIVERT) 25 MG tablet Take 1 tablet (25 mg total) by mouth 3 (three) times daily as needed for dizziness.  . metoprolol succinate (TOPROL-XL) 100 MG 24 hr tablet TAKE 1 TABLET (100 MG TOTAL) BY MOUTH DAILY.  Marland Kitchen omeprazole (PRILOSEC) 40 MG capsule TAKE ONE CAPSULE BY MOUTH EVERY DAY  . hydrochlorothiazide (HYDRODIURIL) 25 MG tablet Take 1 tablet (25 mg total) by mouth daily. (Patient not taking: Reported on 07/29/2016)   No facility-administered encounter medications on file as of 07/29/2016.     Activities of Daily Living In your present state of health, do you have any difficulty performing the following activities: 07/29/2016  Hearing? Y  Vision? N  Difficulty concentrating or making decisions? N  Walking or climbing stairs? Y  Dressing or bathing? N  Doing errands, shopping? N  Preparing Food and eating ? N  Using the Toilet? N  In the past six months, have you accidently leaked urine? Y  Do you have problems with loss of bowel control? N  Managing your  Medications? N  Managing your Finances? N  Housekeeping or managing your Housekeeping? N  Some recent data might be hidden    Patient Care Team: Tonia Ghent, MD as PCP - General (Family Medicine)    Assessment:     Hearing Screening   125Hz  250Hz  500Hz  1000Hz  2000Hz  3000Hz  4000Hz  6000Hz  8000Hz   Right ear:   40 40 40  40    Left ear:   40 40 40  40    Vision Screening Comments: Last vision exam in May 2017  Exercise Activities and Dietary recommendations Current Exercise Habits: Home exercise routine;Structured exercise class, Type of exercise: stretching;treadmill;Other - see comments (silver sneakers), Time (Minutes): 30, Frequency (Times/Week): 4, Weekly Exercise (Minutes/Week): 120, Intensity: Moderate, Exercise limited by: None identified  Goals    . Increase physical activity          Starting 07/29/2016, I will continue to exercise for at least 30 min 3-4 days  per week.       Fall Risk Fall Risk  07/29/2016 07/18/2015 01/12/2015 01/07/2014  Falls in the past year? No No No Yes  Number falls in past yr: - - - 1  Injury with Fall? - - - Yes   Depression Screen PHQ 2/9 Scores 07/29/2016 07/18/2015 01/12/2015 01/07/2014  PHQ - 2 Score 0 0 0 1     Cognitive Function MMSE - Mini Mental State Exam 07/29/2016  Orientation to time 5  Orientation to Place 5  Registration 3  Attention/ Calculation 0  Recall 3  Language- name 2 objects 0  Language- repeat 1  Language- follow 3 step command 3  Language- read & follow direction 0  Write a sentence 0  Copy design 0  Total score 20     PLEASE NOTE: A Mini-Cog screen was completed. Maximum score is 20. A value of 0 denotes this part of Folstein MMSE was not completed or the patient failed this part of the Mini-Cog screening.   Mini-Cog Screening Orientation to Time - Max 5 pts Orientation to Place - Max 5 pts Registration - Max 3 pts Recall - Max 3 pts Language Repeat - Max 1 pts Language Follow 3 Step Command - Max 3 pts'    Immunization History  Administered Date(s) Administered  . Influenza Whole 03/06/2005  . Influenza,inj,Quad PF,36+ Mos 01/07/2014, 01/12/2015  . Influenza-Unspecified 05/20/2016  . Td 03/06/2005, 01/25/2013   Screening Tests Health Maintenance  Topic Date Due  . PNA vac Low Risk Adult (1 of 2 - PCV13) 09/03/2016 (Originally 08/17/2007)  . FOOT EXAM  01/30/2017 (Originally 01/12/2016)  . OPHTHALMOLOGY EXAM  09/03/2016  . HEMOGLOBIN A1C  01/29/2017  . COLONOSCOPY  07/29/2017  . MAMMOGRAM  03/05/2018  . TETANUS/TDAP  01/26/2023  . INFLUENZA VACCINE  Addressed  . DEXA SCAN  Completed      Plan:     I have personally reviewed and addressed the Medicare Annual Wellness questionnaire and have noted the following in the patient's chart:  A. Medical and social history B. Use of alcohol, tobacco or illicit drugs  C. Current medications and supplements D. Functional ability  and status E.  Nutritional status F.  Physical activity G. Advance directives H. List of other physicians I.  Hospitalizations, surgeries, and ER visits in previous 12 months J.  [place]ona to include hearing, vision, cognitive, depression L. Referrals and appointments - none  In addition, I have reviewed and discussed with patient certain preventive protocols, quality metrics, and best  practice recommendations. A written personalized care plan for preventive services as well as general preventive health recommendations were provided to patient.  See attached scanned questionnaire for additional information.   Signed,   Lindell Noe, MHA, BS, LPN Health Coach

## 2016-07-29 NOTE — Progress Notes (Signed)
Pre visit review using our clinic review tool, if applicable. No additional management support is needed unless otherwise documented below in the visit note. 

## 2016-07-31 ENCOUNTER — Other Ambulatory Visit: Payer: Self-pay | Admitting: Family Medicine

## 2016-07-31 LAB — URINE CULTURE

## 2016-07-31 MED ORDER — SULFAMETHOXAZOLE-TRIMETHOPRIM 800-160 MG PO TABS: 1.0000 | ORAL_TABLET | Freq: Two times a day (BID) | ORAL | 0 refills | Status: DC

## 2016-08-05 ENCOUNTER — Telehealth: Payer: Self-pay | Admitting: Family Medicine

## 2016-08-05 NOTE — Telephone Encounter (Signed)
Called patient back and was advised that she did not understand what the person told her when they called her with her lab results. Advised patient of results and she verbalized understanding.

## 2016-08-05 NOTE — Telephone Encounter (Signed)
Patient is asking for Rollene Fare to call her back about her lab results.  She has a question about her urine culture and sugar results.

## 2016-08-06 ENCOUNTER — Other Ambulatory Visit: Payer: Self-pay | Admitting: Family Medicine

## 2016-08-08 ENCOUNTER — Encounter: Payer: Self-pay | Admitting: Family Medicine

## 2016-08-08 ENCOUNTER — Ambulatory Visit (INDEPENDENT_AMBULATORY_CARE_PROVIDER_SITE_OTHER): Payer: Medicare Other | Admitting: Family Medicine

## 2016-08-08 VITALS — BP 130/80 | HR 76 | Temp 98.3°F | Ht 63.0 in | Wt 196.0 lb

## 2016-08-08 DIAGNOSIS — K219 Gastro-esophageal reflux disease without esophagitis: Secondary | ICD-10-CM | POA: Diagnosis not present

## 2016-08-08 DIAGNOSIS — I10 Essential (primary) hypertension: Secondary | ICD-10-CM

## 2016-08-08 DIAGNOSIS — E559 Vitamin D deficiency, unspecified: Secondary | ICD-10-CM | POA: Diagnosis not present

## 2016-08-08 DIAGNOSIS — M8580 Other specified disorders of bone density and structure, unspecified site: Secondary | ICD-10-CM | POA: Diagnosis not present

## 2016-08-08 DIAGNOSIS — Z7189 Other specified counseling: Secondary | ICD-10-CM

## 2016-08-08 DIAGNOSIS — Z23 Encounter for immunization: Secondary | ICD-10-CM | POA: Diagnosis not present

## 2016-08-08 DIAGNOSIS — M25569 Pain in unspecified knee: Secondary | ICD-10-CM

## 2016-08-08 DIAGNOSIS — N390 Urinary tract infection, site not specified: Secondary | ICD-10-CM

## 2016-08-08 DIAGNOSIS — Z0000 Encounter for general adult medical examination without abnormal findings: Secondary | ICD-10-CM | POA: Diagnosis not present

## 2016-08-08 DIAGNOSIS — Z8639 Personal history of other endocrine, nutritional and metabolic disease: Secondary | ICD-10-CM

## 2016-08-08 MED ORDER — AMLODIPINE BESYLATE 10 MG PO TABS: 10.0000 mg | ORAL_TABLET | Freq: Every day | ORAL | 3 refills | Status: DC

## 2016-08-08 MED ORDER — METOPROLOL SUCCINATE ER 100 MG PO TB24: 100.0000 mg | ORAL_TABLET | Freq: Every day | ORAL | 3 refills | Status: DC

## 2016-08-08 MED ORDER — CELECOXIB 200 MG PO CAPS: 200.0000 mg | ORAL_CAPSULE | Freq: Every day | ORAL | 3 refills | Status: DC

## 2016-08-08 MED ORDER — VITAMIN D (ERGOCALCIFEROL) 1.25 MG (50000 UNIT) PO CAPS: 50000.0000 [IU] | ORAL_CAPSULE | ORAL | 0 refills | Status: DC

## 2016-08-08 MED ORDER — OMEPRAZOLE 40 MG PO CPDR: DELAYED_RELEASE_CAPSULE | ORAL | 3 refills | Status: DC

## 2016-08-08 NOTE — Progress Notes (Signed)
Pre visit review using our clinic review tool, if applicable. No additional management support is needed unless otherwise documented below in the visit note. 

## 2016-08-08 NOTE — Patient Instructions (Addendum)
Check with your insurance to see if they will cover the shingles shot. Recheck vitamin D level at a lab visit after you finish the extra weekly vitamin D pills.   Send me the foster form and I'll work on it.  Take care.  Glad to see you.  Update me as needed.

## 2016-08-08 NOTE — Progress Notes (Addendum)
Urine sx resolved.  Done with abx.  ucx d/w pt.    Flu vaccine - per pt, administered in Jan 2018 A1C - completed Eye exam - per pt, completed in May 2017 PNA vaccine d/w pt.  Colonoscopy not due until 2019.   Advance directive d/w pt. Daughter [name] and son [name] equally designated if patient were incapacitated.   Hypertension:    Using medication without problems or lightheadedness: yes Chest pain with exertion:no Edema:no Short of breath:no Has been going to the gym and doing better overall.    GERD controlled with PPI, no ADE on med.  She is avoiding trigger foods.    Not diabetic.  D/w pt.   Prediabetic by A1c. 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   Vit D low, d/w pt. h/o osteopenia.  D/w pt.  See plan.   Joint pains with weather changes, esp knees.  Better with prn celebrex.    Meds, vitals, and allergies reviewed.   PMH and SH reviewed  ROS: Per HPI unless specifically indicated in ROS section   GEN: nad, alert and oriented HEENT: mucous membranes moist NECK: supple w/o LA CV: rrr. PULM: ctab, no inc wob ABD: soft, +bs EXT: no edema SKIN: no acute rash but lipoma noted near the L shoulder, superior to the clavicle, not ttp (unchanged per patient, reassured her)

## 2016-08-09 DIAGNOSIS — Z0000 Encounter for general adult medical examination without abnormal findings: Secondary | ICD-10-CM | POA: Insufficient documentation

## 2016-08-09 DIAGNOSIS — N390 Urinary tract infection, site not specified: Secondary | ICD-10-CM | POA: Insufficient documentation

## 2016-08-09 NOTE — Assessment & Plan Note (Signed)
Defer repeat DXA at this point.  D/w pt.  Needs to get Vit D level up first.  See vit D def orders.

## 2016-08-09 NOTE — Assessment & Plan Note (Signed)
Restart vit D 50000 units weekly and recheck in about 3 months.  She agrees.

## 2016-08-09 NOTE — Assessment & Plan Note (Signed)
Discussed with patient.   Symptoms resolved.

## 2016-08-09 NOTE — Assessment & Plan Note (Signed)
Advance directive d/w pt. Daughter [name] and son [name] equally designated if patient were incapacitated.

## 2016-08-09 NOTE — Assessment & Plan Note (Signed)
Recheck controlled, continue current meds.  She agrees.

## 2016-08-09 NOTE — Assessment & Plan Note (Signed)
Flu vaccine - per pt, administered in Jan 2018 A1C - completed Eye exam - per pt, completed in May 2017 PNA vaccine d/w pt.  Colonoscopy not due until 2019.   Advance directive d/w pt. Daughter [name] and son [name] equally designated if patient were incapacitated.

## 2016-08-09 NOTE — Assessment & Plan Note (Signed)
Not currently diabetic, but prediabetic, d/w pt about labs, diet and exercise.

## 2016-08-09 NOTE — Assessment & Plan Note (Signed)
GERD controlled with PPI, no ADE on med.  She is avoiding trigger foods.

## 2016-08-09 NOTE — Assessment & Plan Note (Signed)
Improved with prn celebrex, okay to continue, d/w pt.  She agrees.  >25 minutes spent in face to face time with patient, >50% spent in counselling or coordination of care.

## 2016-09-03 ENCOUNTER — Ambulatory Visit: Payer: Medicare Other

## 2016-09-26 ENCOUNTER — Encounter: Payer: Self-pay | Admitting: *Deleted

## 2016-10-12 ENCOUNTER — Other Ambulatory Visit: Payer: Self-pay | Admitting: Family Medicine

## 2016-11-22 ENCOUNTER — Other Ambulatory Visit: Payer: Self-pay | Admitting: Family Medicine

## 2016-11-22 NOTE — Telephone Encounter (Signed)
Electronic refill request. Vitamin D2 50,000 units Last office visit:   August 08, 2016 Last Filled:    12 capsule 0 08/08/2016  Please advise.

## 2016-11-24 NOTE — Telephone Encounter (Signed)
Needs repeat vitamin D level done before refill. Order is in. Needs lab visit. Thanks.

## 2016-11-25 NOTE — Telephone Encounter (Signed)
Left detailed message on voicemail.  

## 2017-01-02 ENCOUNTER — Telehealth: Payer: Self-pay | Admitting: Family Medicine

## 2017-01-02 NOTE — Telephone Encounter (Signed)
Unable to reach pt by phone.

## 2017-01-02 NOTE — Telephone Encounter (Signed)
Spoke to patient who says she has tried to stay off of her feet and elevate them and they seem to be a little better.  Patient says she thinks she will be alright to wait until Tuesday for the appt because she really doesn't have transportation until then.  Appt scheduled but patient was advised that if she gets worse in the meantime, she is to go to ER or UC and she understood and agreed.

## 2017-01-02 NOTE — Telephone Encounter (Signed)
Please see what details you can get.  Thanks.

## 2017-01-02 NOTE — Telephone Encounter (Signed)
Patient Name: Cassandra Cooke  DOB: [birth year]    Initial Comment [name] states that she is having leg swelling and pain   Nurse Assessment  Nurse: Raphael Gibney, RN, Vanita Ingles Date/Time (Eastern Time): 01/02/2017 12:26:31 PM  Confirm and document reason for call. If symptomatic, describe symptoms. ---[name] states she has swelling in both legs but the left leg is more swollen. Both legs are painful. Has swelling in both feet and ankles. Has had swelling for 3 days.  Does the patient have any new or worsening symptoms? ---Yes  Will a triage be completed? ---Yes  Related visit to physician within the last 2 weeks? ---No  Does the PT have any chronic conditions? (i.e. diabetes, asthma, etc.) ---No  Is this a behavioral health or substance abuse call? ---No     Guidelines    Guideline Title Affirmed Question Affirmed Notes  Ankle Swelling [1] MODERATE pain (e.g., interferes with normal activities, limping) AND [2] present > 3 days    Final Disposition User   See PCP When Office is Open (within 3 days) Raphael Gibney, RN, Vanita Ingles    Comments  pt would also like her Vitamin D level checked.  pt states she is not available for an appt until Tuesday. Please call pt back regarding appt.   Disagree/Comply: Disagree  Disagree/Comply Reason: Disagree with instructions

## 2017-01-07 ENCOUNTER — Ambulatory Visit: Payer: Medicare Other | Admitting: Family Medicine

## 2017-01-07 DIAGNOSIS — Z0289 Encounter for other administrative examinations: Secondary | ICD-10-CM

## 2017-01-15 ENCOUNTER — Other Ambulatory Visit: Payer: Self-pay | Admitting: Family Medicine

## 2017-05-01 ENCOUNTER — Ambulatory Visit: Payer: Self-pay | Admitting: *Deleted

## 2017-05-01 ENCOUNTER — Other Ambulatory Visit: Payer: Self-pay | Admitting: Family Medicine

## 2017-05-01 DIAGNOSIS — Z139 Encounter for screening, unspecified: Secondary | ICD-10-CM

## 2017-05-01 NOTE — Telephone Encounter (Signed)
Pt stated that she has been having some chest tightness and along her right and left side. It has been off and on for a while. She states the cold makes it worst. No shortness of breathe. She states she has nausea because she has gastritis.  B/p this morning is 172/82 and five minutes later is  154/78 HR 62.  Home care advice given to patient with verbal understanding.  Appointment has been made for in the morning.  Reason for Disposition . [1] Chest pain lasts > 5 minutes AND [2] occurred > 3 days ago (72 hours) AND [3] NO chest pain or cardiac symptoms now  Protocols used: CHEST PAIN-A-AH

## 2017-05-01 NOTE — Telephone Encounter (Signed)
   Answer Assessment - Initial Assessment Questions 1. LOCATION: "Where does it hurt?"       Mid upper chest on the left side. 2. RADIATION: "Does the pain go anywhere else?" (e.g., into neck, jaw, arms, back)     Goes to the left shoulder, left arm and back 3. ONSET: "When did the chest pain begin?" (Minutes, hours or days)      Off and on for a while, the last week has been the worst, had a bad cold 4. PATTERN "Does the pain come and go, or has it been constant since it started?"  "Does it get worse with exertion?"      Comes and goes. The cold makes it worst 5. DURATION: "How long does it last" (e.g., seconds, minutes, hours)     Maybe for about 15 mins 6. SEVERITY: "How bad is the pain?"  (e.g., Scale 1-10; mild, moderate, or severe)    - MILD (1-3): doesn't interfere with normal activities     - MODERATE (4-7): interferes with normal activities or awakens from sleep    - SEVERE (8-10): excruciating pain, unable to do any normal activities        #5 7. CARDIAC RISK FACTORS: "Do you have any history of heart problems or risk factors for heart disease?" (e.g., prior heart attack, angina; high blood pressure, diabetes, being overweight, high cholesterol, smoking, or strong family history of heart disease)     Chest pain in the past, high blood pressure, borderline diabetic, overweight, family history of heart disease 8. PULMONARY RISK FACTORS: "Do you have any history of lung disease?"  (e.g., blood clots in lung, asthma, emphysema, birth control pills)     no 9. CAUSE: "What do you think is causing the chest pain?"     Not sure, maybe bronchitis, cold 10. OTHER SYMPTOMS: "Do you have any other symptoms?" (e.g., dizziness, nausea, vomiting, sweating, fever, difficulty breathing, cough)       Nauseated at times , has gastritis, cough 11. PREGNANCY: "Is there any chance you are pregnant?" "When was your last menstrual period?"       n/a  Protocols used: CHEST PAIN-A-AH

## 2017-05-01 NOTE — Telephone Encounter (Signed)
Noted,will see tomorrow. Thanks.

## 2017-05-02 ENCOUNTER — Ambulatory Visit: Payer: Medicare Other | Admitting: Family Medicine

## 2017-05-02 ENCOUNTER — Encounter: Payer: Self-pay | Admitting: Family Medicine

## 2017-05-02 VITALS — BP 158/80 | HR 80 | Temp 98.5°F | Wt 197.0 lb

## 2017-05-02 DIAGNOSIS — J069 Acute upper respiratory infection, unspecified: Secondary | ICD-10-CM | POA: Diagnosis not present

## 2017-05-02 DIAGNOSIS — E559 Vitamin D deficiency, unspecified: Secondary | ICD-10-CM | POA: Diagnosis not present

## 2017-05-02 LAB — VITAMIN D 25 HYDROXY (VIT D DEFICIENCY, FRACTURES): VITD: 14.89 ng/mL — AB (ref 30.00–100.00)

## 2017-05-02 MED ORDER — AMOXICILLIN-POT CLAVULANATE 875-125 MG PO TABS: 1.0000 | ORAL_TABLET | Freq: Two times a day (BID) | ORAL | 0 refills | Status: DC

## 2017-05-02 NOTE — Patient Instructions (Addendum)
Go to the lab on the way out.  We'll contact you with your lab report (vitamin D). Rest and fluids.  Likely will not need antibiotics but if more discolored sputum or facial pain in the next few days then start augmentin.  Take care.  Glad to see you.

## 2017-05-02 NOTE — Progress Notes (Signed)
Done with prev vit D replacement.  Due for recheck.    Sx started about 2 weeks ago.  She was worse more recently.  Initially with a cold, hoarse voice.  She felt weak in general, but not focally.  Tired.  Some R lower back pain.  Cough.  Scant sputum.  Taking coricidin.  Chest feels congested.  No fevers.  No vomiting, no diarrhea.  Some nausea.  Some facial pain, some HA prev.  Prev ear pain, some better now.  She feels about the same as a few days ago.    She had prev foot injection but not recently.  D/w pt about plantar fascia stretch in the AM prior to getting out of bed.   Meds, vitals, and allergies reviewed.   ROS: Per HPI unless specifically indicated in ROS section   GEN: nad, alert and oriented HEENT: mucous membranes moist, tm w/o erythema, nasal exam w/ mild erythema, clear discharge noted,  OP with minimal  Cobblestoning, sinuses not ttp x4.  NECK: supple w/o LA CV: rrr.   PULM: ctab, no inc wob EXT: no edema SKIN: no acute rash Normal DP pulses B

## 2017-05-04 DIAGNOSIS — J069 Acute upper respiratory infection, unspecified: Secondary | ICD-10-CM | POA: Insufficient documentation

## 2017-05-04 MED ORDER — VITAMIN D (ERGOCALCIFEROL) 1.25 MG (50000 UNIT) PO CAPS: 50000.0000 [IU] | ORAL_CAPSULE | ORAL | 0 refills | Status: DC

## 2017-05-04 NOTE — Assessment & Plan Note (Signed)
Nontoxic.  Okay for outpatient follow-up.  Discussed with patient about differential. Rest and fluids.  Likely will not need antibiotics but if more discolored sputum or facial pain in the next few days then start augmentin.  She agrees.

## 2017-05-04 NOTE — Assessment & Plan Note (Signed)
See notes on labs. 

## 2017-05-27 ENCOUNTER — Ambulatory Visit
Admission: RE | Admit: 2017-05-27 | Discharge: 2017-05-27 | Disposition: A | Discharge status: Home / Self Care | Payer: Medicare Other | Source: Ambulatory Visit | Attending: Family Medicine | Admitting: Family Medicine

## 2017-05-27 DIAGNOSIS — Z139 Encounter for screening, unspecified: Secondary | ICD-10-CM

## 2017-05-29 ENCOUNTER — Encounter: Payer: Self-pay | Admitting: *Deleted

## 2017-05-30 ENCOUNTER — Encounter (HOSPITAL_COMMUNITY): Payer: Self-pay | Admitting: Emergency Medicine

## 2017-05-30 ENCOUNTER — Ambulatory Visit (INDEPENDENT_AMBULATORY_CARE_PROVIDER_SITE_OTHER): Payer: Medicare Other

## 2017-05-30 ENCOUNTER — Other Ambulatory Visit: Payer: Self-pay

## 2017-05-30 ENCOUNTER — Ambulatory Visit (HOSPITAL_COMMUNITY)
Admission: EM | Admit: 2017-05-30 | Discharge: 2017-05-30 | Disposition: A | Discharge status: Home / Self Care | Payer: Medicare Other | Attending: Family Medicine | Admitting: Family Medicine

## 2017-05-30 DIAGNOSIS — R0789 Other chest pain: Secondary | ICD-10-CM

## 2017-05-30 DIAGNOSIS — R05 Cough: Secondary | ICD-10-CM | POA: Diagnosis not present

## 2017-05-30 DIAGNOSIS — J40 Bronchitis, not specified as acute or chronic: Secondary | ICD-10-CM

## 2017-05-30 MED ORDER — PREDNISONE 20 MG PO TABS: 40.0000 mg | ORAL_TABLET | Freq: Every day | ORAL | 0 refills | Status: AC

## 2017-05-30 MED ORDER — BENZONATATE 100 MG PO CAPS: 100.0000 mg | ORAL_CAPSULE | Freq: Three times a day (TID) | ORAL | 0 refills | Status: DC

## 2017-05-30 NOTE — Discharge Instructions (Signed)
Push fluids to ensure adequate hydration and keep secretions thin.  Tylenol as needed for pain or fevers. Complete 5 days of steroid. Tessalon as needed for cough. May continue with mucinex as needed. Please follow up with your primary care provider next week for a recheck.  Return to be seen or go to ER if develop increased shortness of breath, chest pain, fevers or weakness.

## 2017-05-30 NOTE — ED Triage Notes (Signed)
Pt c/o cough and cold symptoms x3 weeks, states she felt better but then started feeling it again. Coughing with chest congestion and HA.

## 2017-05-30 NOTE — ED Provider Notes (Signed)
[place]    CSN: [id number] Arrival date & time: 05/30/17  1237     History   Chief Complaint Chief Complaint  Patient presents with  . Cough    HPI Cassandra Cooke is a 77 y.o. female.   [name] presents with complaints of worsening cough since 1/20. States she had developed cough and congestion 3 weeks ago, was prescribed antibiotics and took a few, did not complete course, felt better for approximately 1 week, and then symptoms returned 1/20. States she feels short of breath when coughing starts and chest tightness. No history of asthma, does not smoke. Has been taking mucinex and clorocetin for cough which has minimally helped. Without fevers. No known ill contacts. Denies sore throat ear pain or runny nose. Originally had sore throat which has improved. History of allergies, anxiety, diabetes without medications, hypertension.   ROS per HPI.       Past Medical History:  Diagnosis Date  . Allergy   . Anxiety    during a period of social upheaval  . Bloating   . Cataract   . Diabetes mellitus type II    Borderline  . Diverticulosis of colon   . Heartburn   . Hiatal hernia   . Hypertension     Patient Active Problem List   Diagnosis Date Noted  . URI (upper respiratory infection) 05/04/2017  . Healthcare maintenance 08/09/2016  . UTI (urinary tract infection) 08/09/2016  . Pain in knee joint 01/13/2015  . Osteopenia 01/16/2014  . Medicare annual wellness visit, subsequent 01/10/2014  . Advance care planning 01/10/2014  . Vitamin D deficiency 06/14/2010  . GERD 03/15/2009  . UNSPECIFIED MENOPAUSAL&POSTMENOPAUSAL DISORDER 06/01/2008  . ANKLE EDEMA, CHRONIC 03/15/2008  . LEG CRAMPS 11/18/2006  . History of diabetes mellitus, type II 10/07/2006  . HYPERCHOLESTEROLEMIA 10/07/2006  . OBESITY 10/07/2006  . Essential hypertension 10/07/2006  . DIVERTICULOSIS, COLON 10/07/2006  . CONSTIPATION 10/07/2006    Past Surgical History:  Procedure  Laterality Date  . CATARACT EXTRACTION     Bil  . COLONOSCOPY    . DILATION AND CURETTAGE OF UTERUS     1 miscarriage  . DILATION AND CURETTAGE OF UTERUS  01/2008   vaginal bleeding (Dr. Ronita Hipps)  . OOPHORECTOMY  1965   right due to cysts  . OOPHORECTOMY  1971   ovarian cystectomy left  . POLYPECTOMY    . VAGINAL DELIVERY     NSVD x 5    OB History    No data available       Home Medications    Prior to Admission medications   Medication Sig Start Date End Date Taking? Authorizing Provider  amLODipine (NORVASC) 10 MG tablet Take 1 tablet (10 mg total) by mouth daily. 08/08/16   Tonia Ghent, MD  benzonatate (TESSALON) 100 MG capsule Take 1 capsule (100 mg total) by mouth every 8 (eight) hours. 05/30/17   Zigmund Gottron, NP  celecoxib (CELEBREX) 200 MG capsule Take 1 capsule (200 mg total) by mouth daily. 08/08/16   Tonia Ghent, MD  metoprolol succinate (TOPROL-XL) 100 MG 24 hr tablet Take 1 tablet (100 mg total) by mouth daily. 08/08/16   Tonia Ghent, MD  omeprazole (PRILOSEC) 40 MG capsule TAKE 1 CAPSULE BY MOUTH EVERY DAY 08/08/16   Tonia Ghent, MD  predniSONE (DELTASONE) 20 MG tablet Take 2 tablets (40 mg total) by mouth daily with breakfast for 5 days. 05/30/17 06/04/17  Zigmund Gottron, NP  Vitamin D, Ergocalciferol, (DRISDOL) 50000 units CAPS capsule Take 1 capsule (50,000 Units total) by mouth every 7 (seven) days. 05/04/17   Tonia Ghent, MD    Family History Family History  Problem Relation Age of Onset  . Hypertension Mother   . Heart disease Father        MI  . Hypertension Father   . Ulcers Brother        PUD  . Alcohol abuse Sister   . Alcohol abuse Sister   . Colon cancer Son   . Breast cancer Neg Hx     Social History Social History   Tobacco Use  . Smoking status: Former Smoker    Types: Cigarettes    Last attempt to quit: 07/16/1973    Years since quitting: 43.9  . Smokeless tobacco: Never Used  . Tobacco comment: quit over 40  years ago  Substance Use Topics  . Alcohol use: No  . Drug use: No     Allergies   Aspirin; Hctz [hydrochlorothiazide]; and Lisinopril   Review of Systems Review of Systems   Physical Exam Triage Vital Signs ED Triage Vitals [05/30/17 1322]  Enc Vitals Group     BP (!) 165/104     Pulse Rate 80     Resp 16     Temp 98.5 F (36.9 C)     Temp Source Oral     SpO2 100 %     Weight      Height      Head Circumference      Peak Flow      Pain Score      Pain Loc      Pain Edu?      Excl. in [place]?    No data found.  Updated Vital Signs BP (!) 165/104 (BP Location: Right Arm)   Pulse 80   Temp 98.5 F (36.9 C) (Oral)   Resp 16   SpO2 100%   Visual Acuity Right Eye Distance:   Left Eye Distance:   Bilateral Distance:    Right Eye Near:   Left Eye Near:    Bilateral Near:     Physical Exam  Constitutional: She is oriented to person, place, and time. She appears well-developed and well-nourished. No distress.  HENT:  Head: Normocephalic and atraumatic.  Right Ear: Tympanic membrane, external ear and ear canal normal.  Left Ear: Tympanic membrane, external ear and ear canal normal.  Nose: Nose normal.  Mouth/Throat: Uvula is midline, oropharynx is clear and moist and mucous membranes are normal. No tonsillar exudate.  Eyes: Conjunctivae and EOM are normal. Pupils are equal, round, and reactive to light.  Cardiovascular: Normal rate, regular rhythm and normal heart sounds.  Pulmonary/Chest: Effort normal and breath sounds normal.  Neurological: She is alert and oriented to person, place, and time.  Skin: Skin is warm and dry.     UC Treatments / Results  Labs (all labs ordered are listed, but only abnormal results are displayed) Labs Reviewed - No data to display  EKG  EKG Interpretation None       Radiology Dg Chest 2 View  Result Date: 05/30/2017 CLINICAL DATA:  Cough and chest pain for 3 weeks. EXAM: CHEST  2 VIEW COMPARISON:  08/09/2008  FINDINGS: Upper limits normal heart size noted. There is no evidence of focal airspace disease, pulmonary edema, suspicious pulmonary nodule/mass, pleural effusion, or pneumothorax. No acute bony abnormalities are identified. IMPRESSION: No active cardiopulmonary disease. Electronically Signed  By: Margarette Canada M.D.   On: 05/30/2017 14:18    Procedures Procedures (including critical care time)  Medications Ordered in UC Medications - No data to display   Initial Impression / Assessment and Plan / UC Course  I have reviewed the triage vital signs and the nursing notes.  Pertinent labs & imaging results that were available during my care of the patient were reviewed by me and considered in my medical decision making (see chart for details).     Lungs clear, non toxic in appearance. Without tachypnea, tachycardia, hypoxia. Afebrile. CXR without acute findings. 5 days of 40mg  prednisone, tessalon as needed for cough. Follow up with PCP in 1 week for recheck. Return precautions provided. Patient verbalized understanding and agreeable to plan.    Final Clinical Impressions(s) / UC Diagnoses   Final diagnoses:  Bronchitis    ED Discharge Orders        Ordered    predniSONE (DELTASONE) 20 MG tablet  Daily with breakfast     05/30/17 1423    benzonatate (TESSALON) 100 MG capsule  Every 8 hours     05/30/17 1423       Controlled Substance Prescriptions [place] Controlled Substance Registry consulted? Not Applicable   Zigmund Gottron, NP 05/30/17 1425

## 2017-07-21 ENCOUNTER — Other Ambulatory Visit: Payer: Self-pay | Admitting: *Deleted

## 2017-07-21 NOTE — Telephone Encounter (Signed)
Faxed refill request.  Vitamin D 50,000 iu Last office visit:   05/02/17 Last Filled:   12 capsule 0 05/04/2017  Please advise.

## 2017-07-22 ENCOUNTER — Telehealth: Payer: Self-pay

## 2017-07-22 NOTE — Telephone Encounter (Signed)
Patient advised.  Lab appt scheduled. Patient asks if she could get her blood sugar tested.  She says she asked at last OV and was told to wait until another time.  Does she need to be fasting or is she speaking of an A1c?

## 2017-07-22 NOTE — Telephone Encounter (Signed)
Needs lab visit first before filling again.  Thanks.

## 2017-07-22 NOTE — Telephone Encounter (Signed)
Attempted to reach patient to scheduled AWV and CPE appts. Attempt unsuccessful. Left voicemail on mobile phone.  If patient returns call, please send call to [phone].   Writer has been given approval to schedule CPE with PCP as necessary.   Lab appt on 07/23/17 will be cancelled. Vit D will be completed with CPE labs.

## 2017-07-22 NOTE — Telephone Encounter (Signed)
Please get her set up for AMW and then route back to me and I'll put in the labs. Thanks.

## 2017-07-23 ENCOUNTER — Other Ambulatory Visit: Payer: Medicare Other

## 2017-07-28 ENCOUNTER — Encounter: Payer: Self-pay | Admitting: Gastroenterology

## 2017-07-29 ENCOUNTER — Other Ambulatory Visit: Payer: Self-pay | Admitting: *Deleted

## 2017-08-05 ENCOUNTER — Encounter: Payer: Self-pay | Admitting: Gastroenterology

## 2017-08-07 ENCOUNTER — Telehealth: Payer: Self-pay | Admitting: *Deleted

## 2017-08-07 NOTE — Telephone Encounter (Signed)
Fax received from [facility] requesting order for diabetic supplies.  Patient was contacted and signifies that she is wanting to use this company.  I do not see where the patient has even had a glucometer and supplies.  PPW is in Dr. Josefine Class In [place].

## 2017-08-08 NOTE — Telephone Encounter (Signed)
Denied and faxed back to company.  Patient advised.

## 2017-08-08 NOTE — Telephone Encounter (Signed)
Deny this.  She isn't current diabetic.  Thanks.

## 2017-08-24 ENCOUNTER — Other Ambulatory Visit: Payer: Self-pay | Admitting: Family Medicine

## 2017-08-24 DIAGNOSIS — E559 Vitamin D deficiency, unspecified: Secondary | ICD-10-CM

## 2017-08-24 DIAGNOSIS — Z8639 Personal history of other endocrine, nutritional and metabolic disease: Secondary | ICD-10-CM

## 2017-08-25 ENCOUNTER — Other Ambulatory Visit (INDEPENDENT_AMBULATORY_CARE_PROVIDER_SITE_OTHER): Payer: Medicare Other

## 2017-08-25 DIAGNOSIS — E119 Type 2 diabetes mellitus without complications: Secondary | ICD-10-CM | POA: Diagnosis not present

## 2017-08-25 DIAGNOSIS — E559 Vitamin D deficiency, unspecified: Secondary | ICD-10-CM

## 2017-08-25 DIAGNOSIS — Z8639 Personal history of other endocrine, nutritional and metabolic disease: Secondary | ICD-10-CM | POA: Diagnosis not present

## 2017-08-25 LAB — LIPID PANEL
CHOLESTEROL: 190 mg/dL (ref 0–200)
HDL: 57.4 mg/dL (ref >39.00)
LDL Cholesterol: 114 mg/dL — ABNORMAL HIGH (ref 0–99)
NonHDL: 132.33
TRIGLYCERIDES: 93 mg/dL (ref 0.0–149.0)
Total CHOL/HDL Ratio: 3
VLDL: 18.6 mg/dL (ref 0.0–40.0)

## 2017-08-25 LAB — COMPREHENSIVE METABOLIC PANEL
ALBUMIN: 4.3 g/dL (ref 3.5–5.2)
ALT: 20 U/L (ref 0–35)
AST: 22 U/L (ref 0–37)
Alkaline Phosphatase: 86 U/L (ref 39–117)
BILIRUBIN TOTAL: 0.3 mg/dL (ref 0.2–1.2)
BUN: 11 mg/dL (ref 6–23)
CALCIUM: 10 mg/dL (ref 8.4–10.5)
CO2: 24 mEq/L (ref 19–32)
CREATININE: 0.64 mg/dL (ref 0.40–1.20)
Chloride: 103 mEq/L (ref 96–112)
GFR: 116.33 mL/min (ref >60.00)
Glucose, Bld: 91 mg/dL (ref 70–99)
Potassium: 4.1 mEq/L (ref 3.5–5.1)
Sodium: 139 mEq/L (ref 135–145)
TOTAL PROTEIN: 7.8 g/dL (ref 6.0–8.3)

## 2017-08-25 LAB — VITAMIN D 25 HYDROXY (VIT D DEFICIENCY, FRACTURES): VITD: 24.67 ng/mL — AB (ref 30.00–100.00)

## 2017-08-25 LAB — HEMOGLOBIN A1C: Hgb A1c MFr Bld: 6.3 % (ref 4.6–6.5)

## 2017-08-29 ENCOUNTER — Encounter: Payer: Self-pay | Admitting: Family Medicine

## 2017-08-29 ENCOUNTER — Ambulatory Visit (INDEPENDENT_AMBULATORY_CARE_PROVIDER_SITE_OTHER): Payer: Medicare Other | Admitting: Family Medicine

## 2017-08-29 VITALS — BP 146/90 | HR 77 | Temp 98.3°F | Ht 62.5 in | Wt 192.5 lb

## 2017-08-29 DIAGNOSIS — Z8639 Personal history of other endocrine, nutritional and metabolic disease: Secondary | ICD-10-CM

## 2017-08-29 DIAGNOSIS — Z0000 Encounter for general adult medical examination without abnormal findings: Secondary | ICD-10-CM | POA: Diagnosis not present

## 2017-08-29 DIAGNOSIS — Z23 Encounter for immunization: Secondary | ICD-10-CM | POA: Diagnosis not present

## 2017-08-29 DIAGNOSIS — K219 Gastro-esophageal reflux disease without esophagitis: Secondary | ICD-10-CM | POA: Diagnosis not present

## 2017-08-29 DIAGNOSIS — M79673 Pain in unspecified foot: Secondary | ICD-10-CM

## 2017-08-29 DIAGNOSIS — Z780 Asymptomatic menopausal state: Secondary | ICD-10-CM

## 2017-08-29 DIAGNOSIS — I10 Essential (primary) hypertension: Secondary | ICD-10-CM

## 2017-08-29 DIAGNOSIS — E559 Vitamin D deficiency, unspecified: Secondary | ICD-10-CM

## 2017-08-29 DIAGNOSIS — Z7189 Other specified counseling: Secondary | ICD-10-CM

## 2017-08-29 MED ORDER — VITAMIN D (ERGOCALCIFEROL) 1.25 MG (50000 UNIT) PO CAPS: 50000.0000 [IU] | ORAL_CAPSULE | ORAL | 0 refills | Status: DC

## 2017-08-29 MED ORDER — AMLODIPINE BESYLATE 10 MG PO TABS: 10.0000 mg | ORAL_TABLET | Freq: Every day | ORAL | 3 refills | Status: DC

## 2017-08-29 MED ORDER — METOPROLOL SUCCINATE ER 50 MG PO TB24: 150.0000 mg | ORAL_TABLET | Freq: Every day | ORAL | 3 refills | Status: DC

## 2017-08-29 NOTE — Patient Instructions (Addendum)
We will call about your referral.  Rosaria Ferries or Azalee Course will call you if you don't see one of them on the way out.  Restart vitamin D weekly and recheck a blood level in about 3 months.  Ask up front about seeing Dr. Lorelei Pont about your foot pain.  Cut the amlodipine back to 10mg .  Increase the metoprolol to 150mg  a day.  Update me if your BP isn't better.   Take care.  Glad to see you.

## 2017-08-29 NOTE — Progress Notes (Signed)
I have personally reviewed the Medicare Annual Wellness questionnaire and have noted 1. The patient's medical and social history 2. Their use of alcohol, tobacco or illicit drugs 3. Their current medications and supplements 4. The patient's functional ability including ADL's, fall risks, home safety risks and hearing or visual             impairment. 5. Diet and physical activities 6. Evidence for depression or mood disorders  The patients weight, height, BMI have been recorded in the chart and visual acuity is per eye clinic.  I have made referrals, counseling and provided education to the patient based review of the above and I have provided the pt with a written personalized care plan for preventive services.  Provider list updated- see scanned forms.  Routine anticipatory guidance given to patient.  See health maintenance. The possibility exists that previously documented standard health maintenance information may have been brought forward from a previous encounter into this note.  If needed, that same information has been updated to reflect the current situation based on today's encounter.    Flu 2018 Shingles d/w pt PNA 2019 Tetanus 2014 Colonoscopy pending Breast cancer screening 2019 DXA pending.   Advance directive- son Cassandra Cooke or daughter Cassandra Cooke equally designated if patient were incapacitated.   Cognitive function addressed- see scanned forms- and if abnormal then additional documentation follows.    Fatigue.  She does not seem to be depressed.  She is tired more than anything.  Her brother is having surgery the day of her visit at the office.  She says "I have a lot on me.".  Multiple family members have been ill.  There are a lot of people in the family depending on her.  It is likely that some of her fatigue is related to that.  She does not endorse depressive symptoms.  She has no suicidal or homicidal intent.  She has support at church.  She will update me if this does not get  better in the near future.  Hypertension:    Using medication without problems or lightheadedness: yes Chest pain with exertion: no Edema:occ ankle edema, but not today.  Short of breath:no She had increased her amlodipine to 15mg  a day.  D/w pt. Labs d/w pt.   GERD controlled with PPI.  Burning sensation off med prev.  No ADE on med.   Joint pain improved some with prn celebrex but she still has h/o B ankle and foot pain, episodically flaring.  Able to bear weight.  No recent trauma.  She has B plantar/arch pain.  Stretching prior to getting up helps some.  She feels better in a shoe with a heel.    PMH and SH reviewed  Meds, vitals, and allergies reviewed.   ROS: Per HPI.  Unless specifically indicated otherwise in HPI, the patient denies:  General: fever. Eyes: acute vision changes ENT: sore throat Cardiovascular: chest pain Respiratory: SOB GI: vomiting GU: dysuria Musculoskeletal: acute back pain Derm: acute rash Neuro: acute motor dysfunction Psych: worsening mood Endocrine: polydipsia Heme: bleeding Allergy: hayfever  GEN: nad, alert and oriented HEENT: mucous membranes moist NECK: supple w/o LA CV: rrr. PULM: ctab, no inc wob ABD: soft, +bs EXT: no edema SKIN: no acute rash

## 2017-08-31 NOTE — Progress Notes (Deleted)
Dr. Frederico Hamman T. Nhyla Nappi, MD, [facility] [place], [address] Phone: [phone] Fax: [phone]  09/01/2017  Patient: Cassandra Cooke, MRN: [id number], DOB: 11/02/1940, 77 y.o.  Primary Physician:  Tonia Ghent, MD   No chief complaint on file.  Subjective:   Cassandra Cooke is a 77 y.o. very pleasant female patient who presents with the following:  Pleasant 77 year old female who is here today at the request of my partner Dr. Damita Dunnings for evaluation of foot pain.  Past Medical History, Surgical History, Social History, Family History, Problem List, Medications, and Allergies have been reviewed and updated if relevant.  Patient Active Problem List   Diagnosis Date Noted  . URI (upper respiratory infection) 05/04/2017  . Healthcare maintenance 08/09/2016  . UTI (urinary tract infection) 08/09/2016  . Pain in knee joint 01/13/2015  . Osteopenia 01/16/2014  . Medicare annual wellness visit, subsequent 01/10/2014  . Advance care planning 01/10/2014  . Vitamin D deficiency 06/14/2010  . GERD 03/15/2009  . UNSPECIFIED MENOPAUSAL&POSTMENOPAUSAL DISORDER 06/01/2008  . ANKLE EDEMA, CHRONIC 03/15/2008  . LEG CRAMPS 11/18/2006  . History of diabetes mellitus, type II 10/07/2006  . HYPERCHOLESTEROLEMIA 10/07/2006  . OBESITY 10/07/2006  . Essential hypertension 10/07/2006  . DIVERTICULOSIS, COLON 10/07/2006  . CONSTIPATION 10/07/2006    Past Medical History:  Diagnosis Date  . Allergy   . Anxiety    during a period of social upheaval  . Bloating   . Cataract   . Diabetes mellitus type II    Borderline  . Diverticulosis of colon   . Heartburn   . Hiatal hernia   . Hypertension     Past Surgical History:  Procedure Laterality Date  . CATARACT EXTRACTION     Bil  . COLONOSCOPY    . DILATION AND CURETTAGE OF UTERUS     1 miscarriage  . DILATION AND CURETTAGE OF UTERUS  01/2008   vaginal bleeding (Dr.  Ronita Hipps)  . OOPHORECTOMY  1965   right due to cysts  . OOPHORECTOMY  1971   ovarian cystectomy left  . POLYPECTOMY    . VAGINAL DELIVERY     NSVD x 5    Social History   Socioeconomic History  . Marital status: Widowed    Spouse name: Not on file  . Number of children: Not on file  . Years of education: Not on file  . Highest education level: Not on file  Occupational History  . Occupation: Retired    Fish farm manager: RETIRED  Social Needs  . Financial resource strain: Not on file  . Food insecurity:    Worry: Not on file    Inability: Not on file  . Transportation needs:    Medical: Not on file    Non-medical: Not on file  Tobacco Use  . Smoking status: Former Smoker    Types: Cigarettes    Last attempt to quit: 07/16/1973    Years since quitting: 44.1  . Smokeless tobacco: Never Used  . Tobacco comment: quit over 40 years ago  Substance and Sexual Activity  . Alcohol use: No  . Drug use: No  . Sexual activity: Never  Lifestyle  . Physical activity:    Days per week: Not on file    Minutes per session: Not on file  . Stress: Not on file  Relationships  . Social connections:    Talks on phone: Not on file    Gets together:  Not on file    Attends religious service: Not on file    Active member of club or organization: Not on file    Attends meetings of clubs or organizations: Not on file    Relationship status: Not on file  . Intimate partner violence:    Fear of current or ex partner: Not on file    Emotionally abused: Not on file    Physically abused: Not on file    Forced sexual activity: Not on file  Other Topics Concern  . Not on file  Social History Narrative   1st husband died of cancer.    Widowed 09-01-2011, he had prostate cancer.    H/o mult foster children      1 daughter died of renal disease   1 son with h/o colon cancer   2 other sons and 1 daughter healthy    Family History  Problem Relation Age of Onset  . Hypertension Mother   . Heart disease  Father        MI  . Hypertension Father   . Ulcers Brother        PUD  . Alcohol abuse Sister   . Alcohol abuse Sister   . Colon cancer Son   . Breast cancer Neg Hx     Allergies  Allergen Reactions  . Aspirin     REACTION: GI upset with high dose  . Hctz [Hydrochlorothiazide] Other (See Comments)    cramping  . Lisinopril     Causes cough    Medication list reviewed and updated in full in [facility].  GEN: No fevers, chills. Nontoxic. Primarily MSK c/o today. MSK: Detailed in the HPI GI: tolerating PO intake without difficulty Neuro: No numbness, parasthesias, or tingling associated. Otherwise the pertinent positives of the ROS are noted above.   Objective:   There were no vitals taken for this visit.  ***  Radiology: No results found.  Assessment and Plan:   ***

## 2017-09-01 ENCOUNTER — Ambulatory Visit: Payer: Medicare Other | Admitting: Family Medicine

## 2017-09-01 DIAGNOSIS — Z0289 Encounter for other administrative examinations: Secondary | ICD-10-CM

## 2017-09-01 DIAGNOSIS — M79673 Pain in unspecified foot: Secondary | ICD-10-CM | POA: Insufficient documentation

## 2017-09-01 NOTE — Assessment & Plan Note (Signed)
Advance directive- son [name] or daughter [name] equally designated if patient were incapacitated.

## 2017-09-01 NOTE — Assessment & Plan Note (Signed)
She has recurrent plantar/arch pain and I want her to follow-up with the sports medicine clinic to get their input. D/w pt.  She agrees.

## 2017-09-01 NOTE — Assessment & Plan Note (Signed)
Not diabetic by A1c.  Discussed with patient.

## 2017-09-01 NOTE — Assessment & Plan Note (Signed)
GERD controlled with PPI.  Burning sensation off med prev.  No ADE on med.

## 2017-09-01 NOTE — Assessment & Plan Note (Signed)
Flu 2018 Shingles d/w pt PNA 2019 Tetanus 2014 Colonoscopy pending Breast cancer screening 2019 DXA pending.   Advance directive- son Cassandra Cooke or daughter Cassandra Cooke equally designated if patient were incapacitated.   Cognitive function addressed- see scanned forms- and if abnormal then additional documentation follows.

## 2017-09-01 NOTE — Assessment & Plan Note (Signed)
Cut the amlodipine back to 10mg .  Increase the metoprolol to 150mg  a day.  She can update me as needed about her blood pressure.  She agrees.

## 2017-09-09 NOTE — Progress Notes (Signed)
Dr. Frederico Hamman T. Victorio Creeden, MD, [facility] and Sports Medicine [place], [address] Phone: [phone] Fax: [phone]  09/10/2017  Patient: Cassandra Cooke, MRN: [id number], DOB: May 09, 1940, 77 y.o.  Primary Physician:  Tonia Ghent, MD   Chief Complaint  Patient presents with  . Foot Pain    Bilateral   Subjective:   This 77 y.o. female patient presents with a 3-4 week long history of heel pain. This is notable for worsening pain first thing in the morning when arising and standing after sitting.   Prior foot or ankle fractures: none Prior operations: none Orthotics or bracing: none Medications: celebrex PT or home rehab: none Night splints: no Ice massage: no Ball massage: no  Metatarsal pain: no  The PMH, PSH, Social History, Family History, Medications, and allergies have been reviewed in [facility], and have been updated if relevant.  Patient Active Problem List   Diagnosis Date Noted  . Foot pain 09/01/2017  . Healthcare maintenance 08/09/2016  . Pain in knee joint 01/13/2015  . Osteopenia 01/16/2014  . Medicare annual wellness visit, subsequent 01/10/2014  . Advance care planning 01/10/2014  . Vitamin D deficiency 06/14/2010  . GERD 03/15/2009  . UNSPECIFIED MENOPAUSAL&POSTMENOPAUSAL DISORDER 06/01/2008  . ANKLE EDEMA, CHRONIC 03/15/2008  . LEG CRAMPS 11/18/2006  . History of diabetes mellitus, type II 10/07/2006  . HYPERCHOLESTEROLEMIA 10/07/2006  . OBESITY 10/07/2006  . Essential hypertension 10/07/2006  . DIVERTICULOSIS, COLON 10/07/2006  . CONSTIPATION 10/07/2006    Past Medical History:  Diagnosis Date  . Allergy   . Anxiety    during a period of social upheaval  . Bloating   . Cataract   . Diabetes mellitus type II    Borderline  . Diverticulosis of colon   . Heartburn   . Hiatal hernia   . Hypertension     Past Surgical History:  Procedure Laterality Date  . CATARACT EXTRACTION     Bil  .  COLONOSCOPY    . DILATION AND CURETTAGE OF UTERUS     1 miscarriage  . DILATION AND CURETTAGE OF UTERUS  01/2008   vaginal bleeding (Dr. Ronita Hipps)  . OOPHORECTOMY  1965   right due to cysts  . OOPHORECTOMY  1971   ovarian cystectomy left  . POLYPECTOMY    . VAGINAL DELIVERY     NSVD x 5    Social History   Socioeconomic History  . Marital status: Widowed    Spouse name: Not on file  . Number of children: Not on file  . Years of education: Not on file  . Highest education level: Not on file  Occupational History  . Occupation: Retired    Fish farm manager: RETIRED  Social Needs  . Financial resource strain: Not on file  . Food insecurity:    Worry: Not on file    Inability: Not on file  . Transportation needs:    Medical: Not on file    Non-medical: Not on file  Tobacco Use  . Smoking status: Former Smoker    Types: Cigarettes    Last attempt to quit: 07/16/1973    Years since quitting: 44.1  . Smokeless tobacco: Never Used  . Tobacco comment: quit over 40 years ago  Substance and Sexual Activity  . Alcohol use: No  . Drug use: No  . Sexual activity: Never  Lifestyle  . Physical activity:    Days per week: Not on file    Minutes  per session: Not on file  . Stress: Not on file  Relationships  . Social connections:    Talks on phone: Not on file    Gets together: Not on file    Attends religious service: Not on file    Active member of club or organization: Not on file    Attends meetings of clubs or organizations: Not on file    Relationship status: Not on file  . Intimate partner violence:    Fear of current or ex partner: Not on file    Emotionally abused: Not on file    Physically abused: Not on file    Forced sexual activity: Not on file  Other Topics Concern  . Not on file  Social History Narrative   1st husband died of cancer.    Widowed 2011/08/14, he had prostate cancer.    H/o mult foster children      1 daughter died of renal disease   1 son with h/o colon  cancer   2 other sons and 1 daughter healthy    Family History  Problem Relation Age of Onset  . Hypertension Mother   . Heart disease Father        MI  . Hypertension Father   . Ulcers Brother        PUD  . Alcohol abuse Sister   . Alcohol abuse Sister   . Colon cancer Son   . Breast cancer Neg Hx     Allergies  Allergen Reactions  . Aspirin     REACTION: GI upset with high dose  . Hctz [Hydrochlorothiazide] Other (See Comments)    cramping  . Lisinopril     Causes cough    Medication list reviewed and updated in full in [place].  GEN: No fevers, chills. Nontoxic. Primarily MSK c/o today. MSK: Detailed in the HPI GI: tolerating PO intake without difficulty Neuro: No numbness, parasthesias, or tingling associated. Otherwise the pertinent positives of the ROS are noted above.   Objective:   Blood pressure 130/84, pulse 64, temperature 98.4 F (36.9 C), temperature source Oral, height 5' 2.5" (1.588 m), weight 195 lb 8 oz (88.7 kg).  GEN: Well-developed,well-nourished,in no acute distress; alert,appropriate and cooperative throughout examination HEENT: Normocephalic and atraumatic without obvious abnormalities. Ears, externally no deformities PULM: Breathing comfortably in no respiratory distress EXT: No clubbing, cyanosis, or edema PSYCH: Normally interactive. Cooperative during the interview. Pleasant. Friendly and conversant. Not anxious or depressed appearing. Normal, full affect.  Echymosis: no Edema: no ROM: full LE B Gait: heel toe, non-antalgic MT pain: no Callus pattern: none Lateral Mall: NT Medial Mall: NT Talus: NT Navicular: NT Calcaneous: NT Metatarsals: NT 5th MT: NT Phalanges: NT Achilles: NT Plantar Fascia: tender, medial along PF - worst near central PF. Pain with forced dorsi Fat Pad: NT Peroneals: NT Post Tib: NT Great Toe: Nml motion Ant Drawer: neg Other foot breakdown: none Long arch: preserved Transverse arch:  preserved Hindfoot breakdown: none Sensation: intact  Assessment and Plan:   Plantar fasciitis, bilateral  >25 minutes spent in face to face time with patient, >50% spent in counselling or coordination of care   Location along middle to posterior PF is less common.  Anatomy reviewed. Stretching and rehab are critically important to the treatment of PF. Reviewed footwear. Rigid soles have been shown to help with PF.  Reviewed rehab of stretching and calf raises.  Reviewed rehab from [facility]  Could  benefit from a corticosteroid injection if conservative treatment fails. Given some arch binders and sports insoles  Follow-up: f/u if still problematic in 6 weeks  Patient Instructions  Please read handouts on Plantar Fascitis.    STRETCHING and Strengthening program critically important.    Strengthening on foot and calf muscles as seen in handout.  Calf raises, 2 legged, then 1 legged.  Foot massage with tennis ball.  Ice massage.  Towel Scrunches: get a towel or hand towel, use toes to pick up and scrunch up the towel.  Marble pick-ups, practice picking up marbles with toes and placing into a cup  NEEDS TO BE DONE EVERY DAY    Recommended over the counter insoles. (Spenco or Hapad)  A rigid shoe with good arch support helps: Dansko (great), Jennet Maduro, Merrell No easily bendable shoes.   Tuli's heel cups      Signed,  Maryah Marinaro T. Dontravious Camille, MD   Patient's Medications  New Prescriptions   No medications on file  Previous Medications   AMLODIPINE (NORVASC) 10 MG TABLET    Take 1 tablet (10 mg total) by mouth daily.   CELECOXIB (CELEBREX) 200 MG CAPSULE    Take 1 capsule (200 mg total) by mouth daily.   METOPROLOL SUCCINATE (TOPROL-XL) 50 MG 24 HR TABLET    Take 3 tablets (150 mg total) by mouth daily.   OMEPRAZOLE (PRILOSEC) 40 MG CAPSULE    TAKE 1 CAPSULE BY MOUTH EVERY DAY   VITAMIN D, ERGOCALCIFEROL, (DRISDOL) 50000 UNITS CAPS  CAPSULE    Take 1 capsule (50,000 Units total) by mouth every 7 (seven) days.  Modified Medications   No medications on file  Discontinued Medications   No medications on file

## 2017-09-10 ENCOUNTER — Other Ambulatory Visit: Payer: Self-pay

## 2017-09-10 ENCOUNTER — Encounter: Payer: Self-pay | Admitting: Family Medicine

## 2017-09-10 ENCOUNTER — Ambulatory Visit: Payer: Medicare Other | Admitting: Family Medicine

## 2017-09-10 VITALS — BP 130/84 | HR 64 | Temp 98.4°F | Ht 62.5 in | Wt 195.5 lb

## 2017-09-10 DIAGNOSIS — M722 Plantar fascial fibromatosis: Secondary | ICD-10-CM | POA: Diagnosis not present

## 2017-09-10 NOTE — Patient Instructions (Signed)

## 2017-09-15 ENCOUNTER — Other Ambulatory Visit: Payer: Medicare Other

## 2017-09-23 ENCOUNTER — Encounter: Payer: Self-pay | Admitting: Gastroenterology

## 2017-09-23 ENCOUNTER — Ambulatory Visit: Payer: Medicare Other | Admitting: Gastroenterology

## 2017-09-23 VITALS — BP 160/90 | HR 76 | Ht 61.5 in | Wt 195.0 lb

## 2017-09-23 DIAGNOSIS — Z860100 Personal history of colon polyps, unspecified: Secondary | ICD-10-CM

## 2017-09-23 DIAGNOSIS — Z800 Family history of malignant neoplasm of digestive organs: Secondary | ICD-10-CM

## 2017-09-23 MED ORDER — PEG 3350-KCL-NA BICARB-NACL 420 G PO SOLR: 4000.0000 mL | ORAL | 0 refills | Status: DC

## 2017-09-23 NOTE — Patient Instructions (Addendum)
You will be set up for a colonoscopy for history of polyps, FH of colon cancer. Take your prune juice every day instead of just once in a while.  Normal BMI (Body Mass Index- based on height and weight) is between 23 and 30. Your BMI today is Body mass index is 36.25 kg/m. Marland Kitchen Please consider follow up  regarding your BMI with your Primary Care Provider.

## 2017-09-23 NOTE — Progress Notes (Signed)
Review of pertinent gastrointestinal problems: 1.  Family history of colon cancer, personal history of precancerous colon polyp.  Her son was diagnosed in his 61s.  Colonoscopy March 2014 Dr. Ardis Hughs, diverticulosis and a single subcentimeter adenoma was removed.  Recommended recall at 5-year interval.   HPI: This is a  very pleasant 77 year old woman whom I last saw the time of a colonoscopy.  See those results above.  We recently sent her reminder about surveillance, high risk screening colonoscopy and she wanted to come in to discuss that as well as some mild chronic constipation.  Chief complaint is constipation, h/o colon polyp in 2014, FH of colon cancer  Lifelong constipatoin.  Prune juice works very well.  She never sees blood in her stool.  She will go at the longest 2 or 3 days between bowel movements before she tries prune juice and it always helps.  Fiber supplement in the remote past also helped.    Her weight is overall stable.  No fevers or chills.   Review of systems: Pertinent positive and negative review of systems were noted in the above HPI section. All other review negative.   Past Medical History:  Diagnosis Date  . Allergy   . Anxiety    during a period of social upheaval  . Bloating   . Cataract   . Colon polyps   . Diabetes mellitus type II    Borderline  . Diverticulosis of colon   . Heartburn   . Hiatal hernia   . Hypertension     Past Surgical History:  Procedure Laterality Date  . CATARACT EXTRACTION     Bil  . COLONOSCOPY    . DILATION AND CURETTAGE OF UTERUS     1 miscarriage  . DILATION AND CURETTAGE OF UTERUS  01/2008   vaginal bleeding (Dr. Ronita Hipps)  . OOPHORECTOMY  1965   right due to cysts  . OOPHORECTOMY  1971   ovarian cystectomy left  . POLYPECTOMY    . VAGINAL DELIVERY     NSVD x 5    Current Outpatient Medications  Medication Sig Dispense Refill  . amLODipine (NORVASC) 10 MG tablet Take 1 tablet (10 mg total) by mouth  daily. 90 tablet 3  . celecoxib (CELEBREX) 200 MG capsule Take 1 capsule (200 mg total) by mouth daily. 90 capsule 3  . metoprolol succinate (TOPROL-XL) 50 MG 24 hr tablet Take 3 tablets (150 mg total) by mouth daily. 270 tablet 3  . omeprazole (PRILOSEC) 40 MG capsule TAKE 1 CAPSULE BY MOUTH EVERY DAY 90 capsule 3  . Vitamin D, Ergocalciferol, (DRISDOL) 50000 units CAPS capsule Take 1 capsule (50,000 Units total) by mouth every 7 (seven) days. 12 capsule 0   No current facility-administered medications for this visit.     Allergies as of 09/23/2017 - Review Complete 09/23/2017  Allergen Reaction Noted  . Aspirin  10/07/2006  . Hctz [hydrochlorothiazide] Other (See Comments) 08/08/2016  . Lisinopril  07/29/2016    Family History  Problem Relation Age of Onset  . Hypertension Mother   . Heart disease Father        MI  . Hypertension Father   . Ulcers Brother        PUD  . Alcohol abuse Sister   . Alcohol abuse Sister   . Colon cancer Son   . Breast cancer Neg Hx     Social History   Socioeconomic History  . Marital status: Widowed    Spouse name:  Not on file  . Number of children: Not on file  . Years of education: Not on file  . Highest education level: Not on file  Occupational History  . Occupation: Retired    Fish farm manager: RETIRED  Social Needs  . Financial resource strain: Not on file  . Food insecurity:    Worry: Not on file    Inability: Not on file  . Transportation needs:    Medical: Not on file    Non-medical: Not on file  Tobacco Use  . Smoking status: Former Smoker    Types: Cigarettes    Last attempt to quit: 07/16/1973    Years since quitting: 44.2  . Smokeless tobacco: Never Used  . Tobacco comment: quit over 40 years ago  Substance and Sexual Activity  . Alcohol use: No  . Drug use: No  . Sexual activity: Never  Lifestyle  . Physical activity:    Days per week: Not on file    Minutes per session: Not on file  . Stress: Not on file   Relationships  . Social connections:    Talks on phone: Not on file    Gets together: Not on file    Attends religious service: Not on file    Active member of club or organization: Not on file    Attends meetings of clubs or organizations: Not on file    Relationship status: Not on file  . Intimate partner violence:    Fear of current or ex partner: Not on file    Emotionally abused: Not on file    Physically abused: Not on file    Forced sexual activity: Not on file  Other Topics Concern  . Not on file  Social History Narrative   1st husband died of cancer.    Widowed August 15, 2011, he had prostate cancer.    H/o mult foster children      1 daughter died of renal disease   1 son with h/o colon cancer   2 other sons and 1 daughter healthy     Physical Exam: Ht 5' 1.5" (1.562 m) Comment: height measured without shoes  Wt 195 lb (88.5 kg)   BMI 36.25 kg/m  Constitutional: generally well-appearing Psychiatric: alert and oriented x3 Eyes: extraocular movements intact Mouth: oral pharynx moist, no lesions Neck: supple no lymphadenopathy Cardiovascular: heart regular rate and rhythm Lungs: clear to auscultation bilaterally Abdomen: soft, nontender, nondistended, no obvious ascites, no peritoneal signs, normal bowel sounds Extremities: no lower extremity edema bilaterally Skin: no lesions on visible extremities   Assessment and plan: 77 y.o. female with elevated risk for colon cancer given personal history of precancerous colon polyp, family history of colon cancer in her son.  Also mild chronic constipation  I recommended repeat colonoscopy at her soonest convenience.  That is for polyp surveillance, family history of colon cancer.  Her chronic constipation is mild, it is been going on most of her life.  I think it is very unlikely the sign of anything serious.  Since prune-juice helps when she takes it I recommend she start taking it on a daily basis instead of just once in a  while.  I see no reason for any further blood tests or imaging studies for now.    Please see the "Patient Instructions" section for addition details about the plan.   Owens Loffler, MD [facility] 09/23/2017, 11:16 AM  Cc: Tonia Ghent, MD

## 2017-11-18 ENCOUNTER — Encounter: Payer: Self-pay | Admitting: Internal Medicine

## 2017-11-28 ENCOUNTER — Other Ambulatory Visit (INDEPENDENT_AMBULATORY_CARE_PROVIDER_SITE_OTHER): Payer: Medicare Other

## 2017-11-28 DIAGNOSIS — E559 Vitamin D deficiency, unspecified: Secondary | ICD-10-CM

## 2017-11-28 LAB — VITAMIN D 25 HYDROXY (VIT D DEFICIENCY, FRACTURES): VITD: 17.55 ng/mL — AB (ref 30.00–100.00)

## 2017-12-01 ENCOUNTER — Encounter: Payer: Self-pay | Admitting: Gastroenterology

## 2017-12-01 ENCOUNTER — Telehealth: Payer: Self-pay | Admitting: Gastroenterology

## 2017-12-02 ENCOUNTER — Other Ambulatory Visit: Payer: Self-pay | Admitting: Family Medicine

## 2017-12-02 ENCOUNTER — Encounter: Payer: Medicare Other | Admitting: Gastroenterology

## 2017-12-02 DIAGNOSIS — E559 Vitamin D deficiency, unspecified: Secondary | ICD-10-CM

## 2017-12-02 NOTE — Telephone Encounter (Signed)
Ok, no charge  

## 2018-01-13 ENCOUNTER — Ambulatory Visit (AMBULATORY_SURGERY_CENTER): Payer: Self-pay

## 2018-01-13 ENCOUNTER — Encounter: Payer: Self-pay | Admitting: Gastroenterology

## 2018-01-13 VITALS — Ht 63.0 in | Wt 192.0 lb

## 2018-01-13 DIAGNOSIS — Z86010 Personal history of colonic polyps: Secondary | ICD-10-CM

## 2018-01-13 NOTE — Progress Notes (Signed)
Denies allergies to eggs or soy products. Denies complication of anesthesia or sedation. Denies use of weight loss medication. Denies use of O2.   Emmi instructions declined.  

## 2018-01-21 ENCOUNTER — Encounter: Payer: Self-pay | Admitting: Gastroenterology

## 2018-01-21 ENCOUNTER — Ambulatory Visit (AMBULATORY_SURGERY_CENTER): Payer: Medicare Other | Admitting: Gastroenterology

## 2018-01-21 VITALS — BP 167/85 | HR 69 | Temp 98.6°F | Resp 16 | Ht 63.0 in | Wt 192.0 lb

## 2018-01-21 DIAGNOSIS — Z800 Family history of malignant neoplasm of digestive organs: Secondary | ICD-10-CM | POA: Diagnosis not present

## 2018-01-21 DIAGNOSIS — D120 Benign neoplasm of cecum: Secondary | ICD-10-CM

## 2018-01-21 DIAGNOSIS — K649 Unspecified hemorrhoids: Secondary | ICD-10-CM

## 2018-01-21 DIAGNOSIS — K5730 Diverticulosis of large intestine without perforation or abscess without bleeding: Secondary | ICD-10-CM

## 2018-01-21 DIAGNOSIS — Z86010 Personal history of colonic polyps: Secondary | ICD-10-CM

## 2018-01-21 MED ORDER — SODIUM CHLORIDE 0.9 % IV SOLN: 500.0000 mL | Freq: Once | INTRAVENOUS | Status: DC

## 2018-01-21 NOTE — Progress Notes (Signed)
Pt's states no medical or surgical changes since previsit or office visit. 

## 2018-01-21 NOTE — Progress Notes (Signed)
Called to room to assist during endoscopic procedure.  Patient ID and intended procedure confirmed with present staff. Received instructions for my participation in the procedure from the performing physician.  

## 2018-01-21 NOTE — Patient Instructions (Signed)
YOU HAD AN ENDOSCOPIC PROCEDURE TODAY AT THE [facility]:   Refer to the procedure report that was given to you for any specific questions about what was found during the examination.  If the procedure report does not answer your questions, please call your gastroenterologist to clarify.  If you requested that your care partner not be given the details of your procedure findings, then the procedure report has been included in a sealed envelope for you to review at your convenience later.  YOU SHOULD EXPECT: Some feelings of bloating in the abdomen. Passage of more gas than usual.  Walking can help get rid of the air that was put into your GI tract during the procedure and reduce the bloating. If you had a lower endoscopy (such as a colonoscopy or flexible sigmoidoscopy) you may notice spotting of blood in your stool or on the toilet paper. If you underwent a bowel prep for your procedure, you may not have a normal bowel movement for a few days.  Please Note:  You might notice some irritation and congestion in your nose or some drainage.  This is from the oxygen used during your procedure.  There is no need for concern and it should clear up in a day or so.  SYMPTOMS TO REPORT IMMEDIATELY:   Following lower endoscopy (colonoscopy or flexible sigmoidoscopy):  Excessive amounts of blood in the stool  Significant tenderness or worsening of abdominal pains  Swelling of the abdomen that is new, acute  Fever of 100F or higher  For urgent or emergent issues, a gastroenterologist can be reached at any hour by calling [phone].   DIET:  We do recommend a small meal at first, but then you may proceed to your regular diet.  Drink plenty of fluids but you should avoid alcoholic beverages for 24 hours.  ACTIVITY:  You should plan to take it easy for the rest of today and you should NOT DRIVE or use heavy machinery until tomorrow (because of the sedation medicines used during the test).     FOLLOW UP: Our staff will call the number listed on your records the next business day following your procedure to check on you and address any questions or concerns that you may have regarding the information given to you following your procedure. If we do not reach you, we will leave a message.  However, if you are feeling well and you are not experiencing any problems, there is no need to return our call.  We will assume that you have returned to your regular daily activities without incident.  If any biopsies were taken you will be contacted by phone or by letter within the next 1-3 weeks.  Please call us at [phone] if you have not heard about the biopsies in 3 weeks.   Await for biopsy results Polyps (handout given) Diverticulosis (handout given) Hemorrhoids (handout given)   SIGNATURES/CONFIDENTIALITY: You and/or your care partner have signed paperwork which will be entered into your electronic medical record.  These signatures attest to the fact that that the information above on your After Visit Summary has been reviewed and is understood.  Full responsibility of the confidentiality of this discharge information lies with you and/or your care-partner. 

## 2018-01-21 NOTE — Progress Notes (Signed)
Report given to PACU, vss 

## 2018-01-21 NOTE — Op Note (Signed)
[facility] Patient Name: Cassandra Cooke Procedure Date: 01/21/2018 2:43 PM MRN: [id number] Endoscopist: Milus Banister , MD Age: 77 Referring MD:  Date of Birth: 05/27/1940 Gender: Female Account #: [id number] Procedure:                Colonoscopy Indications:              High risk colon cancer surveillance: Personal                            history of colonic polyps; Family history of colon                            cancer, personal history of precancerous colon                            polyp. Her son was diagnosed in his 64s.                            Colonoscopy March 2014 Dr. Ardis Hughs, diverticulosis                            and a single subcentimeter adenoma was removed.                            Recommended recall at 5-year interval. Medicines:                Monitored Anesthesia Care Procedure:                Pre-Anesthesia Assessment:                           - Prior to the procedure, a History and Physical                            was performed, and patient medications and                            allergies were reviewed. The patient's tolerance of                            previous anesthesia was also reviewed. The risks                            and benefits of the procedure and the sedation                            options and risks were discussed with the patient.                            All questions were answered, and informed consent                            was obtained. Prior Anticoagulants: The patient has  taken no previous anticoagulant or antiplatelet                            agents. ASA Grade Assessment: II - A patient with                            mild systemic disease. After reviewing the risks                            and benefits, the patient was deemed in                            satisfactory condition to undergo the procedure.                           After obtaining informed consent,  the colonoscope                            was passed under direct vision. Throughout the                            procedure, the patient's blood pressure, pulse, and                            oxygen saturations were monitored continuously. The                            Model PCF-[id number]) scope was introduced                            through the anus and advanced to the the cecum,                            identified by appendiceal orifice and ileocecal                            valve. The colonoscopy was performed without                            difficulty. The patient tolerated the procedure                            well. The quality of the bowel preparation was                            good. The ileocecal valve, appendiceal orifice, and                            rectum were photographed. Scope In: 2:46:24 PM Scope Out: 2:58:57 PM Scope Withdrawal Time: 0 hours 9 minutes 7 seconds  Total Procedure Duration: 0 hours 12 minutes 33 seconds  Findings:                 Two sessile polyps were found in the cecum. The  polyps were 2 to 3 mm in size. These polyps were                            removed with a cold snare. Resection and retrieval                            were complete.                           Multiple small and large-mouthed diverticula were                            found in the left colon.                           Internal hemorrhoids were found. The hemorrhoids                            were medium-sized.                           The exam was otherwise without abnormality on                            direct and retroflexion views. Complications:            No immediate complications. Estimated blood loss:                            None. Estimated Blood Loss:     Estimated blood loss: none. Impression:               - Two 2 to 3 mm polyps in the cecum, removed with a                            cold snare. Resected  and retrieved.                           - Diverticulosis in the left colon.                           - Internal hemorrhoids.                           - The examination was otherwise normal on direct                            and retroflexion views. Recommendation:           - Patient has a contact number available for                            emergencies. The signs and symptoms of potential                            delayed complications were discussed with the  patient. Return to normal activities tomorrow.                            Written discharge instructions were provided to the                            patient.                           - Resume previous diet.                           - Continue present medications.                           You will receive a letter within 2-3 weeks with the                            pathology results and my final recommendations.                           If the polyp(s) is proven to be 'pre-cancerous' on                            pathology, you will need repeat colonoscopy in 5                            years. Milus Banister, MD 01/21/2018 3:06:50 PM This report has been signed electronically.

## 2018-01-22 ENCOUNTER — Telehealth: Payer: Self-pay

## 2018-01-22 NOTE — Telephone Encounter (Signed)
Called [phone] and left a messaged we tried to reach pt for a follow up call. maw

## 2018-01-22 NOTE — Telephone Encounter (Signed)
  Follow up Call-  Call back number 01/21/2018  Post procedure Call Back phone  # [phone] Eugene-driver  Permission to leave phone message Yes  Some recent data might be hidden     ID on answering machine [facility] Angela/Call-back

## 2018-01-23 ENCOUNTER — Ambulatory Visit: Payer: Self-pay

## 2018-01-23 NOTE — Telephone Encounter (Signed)
Pt. Reports she started having right back pain - "over my kidney area and it hurts down my leg.Hurts if I move a certain way." Also concerned that she has not had a BM since her colonoscopy. Pt. States she thought she was calling the colonoscopy center. States she is going to call them. Instructed to call back if they do not assist her. Verbalizes understanding.  Answer Assessment - Initial Assessment Questions 1. LOCATION: "Where does it hurt?"      Lower right side and in back 2. RADIATION: "Does the pain shoot anywhere else?" (e.g., chest, back)     Down your leg 3. ONSET: "When did the pain begin?" (e.g., minutes, hours or days ago)      Started Wednesday night  4. SUDDEN: "Gradual or sudden onset?"     Gradual 5. PATTERN "Does the pain come and go, or is it constant?"    - If constant: "Is it getting better, staying the same, or worsening?"      (Note: Constant means the pain never goes away completely; most serious pain is constant and it progresses)     - If intermittent: "How long does it last?" "Do you have pain now?"     (Note: Intermittent means the pain goes away completely between bouts)     Comes and goes 6. SEVERITY: "How bad is the pain?"  (e.g., Scale 1-10; mild, moderate, or severe)   - MILD (1-3): doesn't interfere with normal activities, abdomen soft and not tender to touch    - MODERATE (4-7): interferes with normal activities or awakens from sleep, tender to touch    - SEVERE (8-10): excruciating pain, doubled over, unable to do any normal activities      5 7. RECURRENT SYMPTOM: "Have you ever had this type of abdominal pain before?" If so, ask: "When was the last time?" and "What happened that time?"      No 8. CAUSE: "What do you think is causing the abdominal pain?"     Maybe the colonoscopy 9. RELIEVING/AGGRAVATING FACTORS: "What makes it better or worse?" (e.g., movement, antacids, bowel movement)     If I lay in bed and rest it feels better. 10. OTHER SYMPTOMS:  "Has there been any vomiting, diarrhea, constipation, or urine problems?"       No 11. PREGNANCY: "Is there any chance you are pregnant?" "When was your last menstrual period?"       No  Protocols used: ABDOMINAL PAIN - [facility]

## 2018-01-23 NOTE — Telephone Encounter (Signed)
Noted. Thanks.

## 2018-01-27 ENCOUNTER — Other Ambulatory Visit: Payer: Medicare Other

## 2018-01-30 ENCOUNTER — Encounter: Payer: Self-pay | Admitting: Gastroenterology

## 2018-02-05 ENCOUNTER — Other Ambulatory Visit (INDEPENDENT_AMBULATORY_CARE_PROVIDER_SITE_OTHER): Payer: Medicare Other

## 2018-02-05 DIAGNOSIS — E559 Vitamin D deficiency, unspecified: Secondary | ICD-10-CM | POA: Diagnosis not present

## 2018-02-05 LAB — VITAMIN D 25 HYDROXY (VIT D DEFICIENCY, FRACTURES): VITD: 24.86 ng/mL — ABNORMAL LOW (ref 30.00–100.00)

## 2018-02-08 ENCOUNTER — Other Ambulatory Visit: Payer: Self-pay | Admitting: Family Medicine

## 2018-02-08 DIAGNOSIS — E559 Vitamin D deficiency, unspecified: Secondary | ICD-10-CM

## 2018-02-08 MED ORDER — VITAMIN D (ERGOCALCIFEROL) 1.25 MG (50000 UNIT) PO CAPS: 50000.0000 [IU] | ORAL_CAPSULE | ORAL | 0 refills | Status: DC

## 2018-06-02 ENCOUNTER — Ambulatory Visit (INDEPENDENT_AMBULATORY_CARE_PROVIDER_SITE_OTHER): Payer: Medicare Other | Admitting: Family Medicine

## 2018-06-02 VITALS — BP 144/80 | HR 80 | Temp 98.3°F | Resp 16 | Ht 63.0 in | Wt 192.0 lb

## 2018-06-02 DIAGNOSIS — E559 Vitamin D deficiency, unspecified: Secondary | ICD-10-CM | POA: Diagnosis not present

## 2018-06-02 DIAGNOSIS — N898 Other specified noninflammatory disorders of vagina: Secondary | ICD-10-CM | POA: Diagnosis not present

## 2018-06-02 DIAGNOSIS — R739 Hyperglycemia, unspecified: Secondary | ICD-10-CM

## 2018-06-02 DIAGNOSIS — E7800 Pure hypercholesterolemia, unspecified: Secondary | ICD-10-CM | POA: Diagnosis not present

## 2018-06-02 NOTE — Patient Instructions (Signed)
Go to the lab on the way out.  We'll contact you with your lab report.  Try using [employer]ro[employer]ide or [employer]elly.  There are multiple options.   If you keep having trouble the let me know.   Update me as needed.

## 2018-06-03 ENCOUNTER — Encounter: Payer: Self-pay | Admitting: Family Medicine

## 2018-06-03 ENCOUNTER — Other Ambulatory Visit: Payer: Self-pay | Admitting: Family Medicine

## 2018-06-03 DIAGNOSIS — R739 Hyperglycemia, unspecified: Secondary | ICD-10-CM | POA: Insufficient documentation

## 2018-06-03 DIAGNOSIS — E559 Vitamin D deficiency, unspecified: Secondary | ICD-10-CM

## 2018-06-03 DIAGNOSIS — N898 Other specified noninflammatory disorders of vagina: Secondary | ICD-10-CM | POA: Insufficient documentation

## 2018-06-03 LAB — HEMOGLOBIN A1C: HEMOGLOBIN A1C: 6.2 % (ref 4.6–6.5)

## 2018-06-03 LAB — VITAMIN D 25 HYDROXY (VIT D DEFICIENCY, FRACTURES): VITD: 23.99 ng/mL — ABNORMAL LOW (ref 30.00–100.00)

## 2018-06-03 MED ORDER — VITAMIN D (ERGOCALCIFEROL) 1.25 MG (50000 UNIT) PO CAPS: 50000.0000 [IU] | ORAL_CAPSULE | ORAL | 0 refills | Status: DC

## 2018-06-03 NOTE — Assessment & Plan Note (Signed)
See notes on labs.  Discussed diet.

## 2018-06-03 NOTE — Assessment & Plan Note (Signed)
No bleeding.  No alarming symptoms.  Discussed with patient about options.  She can try multiple over-the-counter lubricants.  Rationale for use described.  All questions answered.  I would not start estrogen at this point.  If she has a lot of symptoms, with pain or dryness, then she may be a candidate for intermittent vaginal estrogen but I would defer that for now.  She agrees.  >25 minutes spent in face to face time with patient, >50% spent in counselling or coordination of care.

## 2018-06-03 NOTE — Assessment & Plan Note (Signed)
See notes on follow-up labs. 

## 2018-06-03 NOTE — Progress Notes (Signed)
Several issues to discuss.  History of vitamin D deficiency.  Status post replacement.  Due for follow-up lab check.  See notes on labs.  She is dating again and considering getting married.  She was worried about vaginal dryness and pain with intercourse.  No discharge.  Discussed options.  She is status post hysterectomy.  She has both ovaries removed.  She is not having any bleeding.  She had history of uterine polyps with negative pathology previously.  See plan.  She would not need routine Pap smears at this point.  Discussed.  History of hyperglycemia.  Due for recheck sugar.  Meds, vitals, and allergies reviewed.   ROS: Per HPI unless specifically indicated in ROS section   GEN: nad, alert and oriented HEENT: mucous membranes moist NECK: supple w/o LA CV: rrr PULM: ctab, no inc wob ABD: soft, +bs EXT: no edema SKIN: Well-perfused

## 2018-10-01 ENCOUNTER — Telehealth: Payer: Self-pay

## 2018-10-01 NOTE — Telephone Encounter (Signed)
Noted. Thanks.  Will await ER notes.

## 2018-10-01 NOTE — Telephone Encounter (Signed)
I spoke with pt; pt has not gone to ED; pt was laying down and BP 158/89 P 88. Pt still has some tightness in chest but is concerned about catching covid. Pt still has tightness in chest even though BP has gone down.Pt is going to put mask on and is going to go to [facility] ED by car now. FYI to Dr Damita Dunnings.

## 2018-10-01 NOTE — Telephone Encounter (Signed)
Agree with ER.

## 2018-10-01 NOTE — Telephone Encounter (Signed)
Thanks, please let me know what you hear.

## 2018-10-01 NOTE — Telephone Encounter (Signed)
Patient calls in today complaining of chest "tightness'', generalizes weakness and some nausea intermittent X 2 weeks.  Worse last week but still present.   She is feeling the tightness (described more as a feeling of pressure) across her chest and into her back today with mild nausea.  Denies any radiating pains to jaw or down arm. No shortness of breath and no numbness/tingling or other neurologic abnormality.    Patient states she is under a lot of stress and is concerned.  She does not have a way to check her temperature and denies any cough, congestion or other sinus/respiratory symptoms.   With patient's old blood pressure meter her result today is 200/116, and repeat reading 179/110.  Pulse 82.     Patient is on omeprazole and has been eating increased amounts of spicy foods which also may be contributing to symptoms but she is not sure.  With her symptoms and very elevated readings (if machine is accurate) I have instructed patient that she should go to the ER for immediate eval and testing.  I will consult with Dr. Damita Dunnings as her PCP and call her back as she is hesitant to go to the hospital right now.

## 2018-10-01 NOTE — Telephone Encounter (Signed)
Spoke with patient and told her Dr. Damita Dunnings agrees that she needs to go to the ER based on symptoms.   Patient is hesitant but states she understands the concern.  She is thinking about what to do but knows that she is to go to [facility] ER for immediate eval and if symptoms worsen to call 911.    States she will call us back and let us know if she goes. I explained the danger of not getting evaluated and the risk she is taking.  Patient was calm and pleasant and processing information.  She did not refuse to go but says she needs to think about it for a minute.  Will follow up with patient a little bit later to be sure she went.

## 2018-10-02 ENCOUNTER — Other Ambulatory Visit: Payer: Self-pay

## 2018-10-02 ENCOUNTER — Emergency Department (HOSPITAL_COMMUNITY)
Admission: EM | Admit: 2018-10-02 | Discharge: 2018-10-02 | Disposition: A | Discharge status: Home / Self Care | Payer: Medicare Other | Attending: Emergency Medicine | Admitting: Emergency Medicine

## 2018-10-02 ENCOUNTER — Emergency Department (HOSPITAL_COMMUNITY): Payer: Medicare Other

## 2018-10-02 DIAGNOSIS — Z79899 Other long term (current) drug therapy: Secondary | ICD-10-CM | POA: Diagnosis not present

## 2018-10-02 DIAGNOSIS — I10 Essential (primary) hypertension: Secondary | ICD-10-CM | POA: Diagnosis not present

## 2018-10-02 DIAGNOSIS — E119 Type 2 diabetes mellitus without complications: Secondary | ICD-10-CM | POA: Diagnosis not present

## 2018-10-02 DIAGNOSIS — R072 Precordial pain: Secondary | ICD-10-CM | POA: Insufficient documentation

## 2018-10-02 DIAGNOSIS — Z87891 Personal history of nicotine dependence: Secondary | ICD-10-CM | POA: Diagnosis not present

## 2018-10-02 DIAGNOSIS — R0789 Other chest pain: Secondary | ICD-10-CM

## 2018-10-02 DIAGNOSIS — R079 Chest pain, unspecified: Secondary | ICD-10-CM | POA: Diagnosis present

## 2018-10-02 LAB — CBC WITH DIFFERENTIAL/PLATELET
Abs Immature Granulocytes: 0.03 10*3/uL (ref 0.00–0.07)
Basophils Absolute: 0 10*3/uL (ref 0.0–0.1)
Basophils Relative: 1 %
Eosinophils Absolute: 0.1 10*3/uL (ref 0.0–0.5)
Eosinophils Relative: 2 %
HCT: 40 % (ref 36.0–46.0)
Hemoglobin: 12.7 g/dL (ref 12.0–15.0)
Immature Granulocytes: 0 %
Lymphocytes Relative: 53 %
Lymphs Abs: 3.7 10*3/uL (ref 0.7–4.0)
MCH: 27.5 pg (ref 26.0–34.0)
MCHC: 31.8 g/dL (ref 30.0–36.0)
MCV: 86.6 fL (ref 80.0–100.0)
Monocytes Absolute: 0.6 10*3/uL (ref 0.1–1.0)
Monocytes Relative: 9 %
Neutro Abs: 2.5 10*3/uL (ref 1.7–7.7)
Neutrophils Relative %: 35 %
Platelets: 301 10*3/uL (ref 150–400)
RBC: 4.62 MIL/uL (ref 3.87–5.11)
RDW: 13.2 % (ref 11.5–15.5)
WBC: 7 10*3/uL (ref 4.0–10.5)
nRBC: 0 % (ref 0.0–0.2)

## 2018-10-02 LAB — BASIC METABOLIC PANEL
Anion gap: 10 (ref 5–15)
BUN: 9 mg/dL (ref 8–23)
CO2: 21 mmol/L — ABNORMAL LOW (ref 22–32)
Calcium: 9.9 mg/dL (ref 8.9–10.3)
Chloride: 107 mmol/L (ref 98–111)
Creatinine, Ser: 0.79 mg/dL (ref 0.44–1.00)
GFR calc Af Amer: >60 mL/min (ref >60)
GFR calc non Af Amer: >60 mL/min (ref >60)
Glucose, Bld: 92 mg/dL (ref 70–99)
Potassium: 4.3 mmol/L (ref 3.5–5.1)
Sodium: 138 mmol/L (ref 135–145)

## 2018-10-02 LAB — TROPONIN I: Troponin I: <0.03 ng/mL (ref <0.03)

## 2018-10-02 NOTE — ED Notes (Signed)
Patient verbalizes understanding of discharge instructions. Opportunity for questioning and answers were provided. Armband removed by staff, pt discharged from ED.  

## 2018-10-02 NOTE — Discharge Instructions (Signed)
Your testing has been reassuring and shows no signs of heart disease.  Please have your family doctor refer you to a cardiologist for further outpatient testing.  Please start taking a baby aspirin daily, 81 mg.  Emergency department for severe or worsening symptoms.

## 2018-10-02 NOTE — ED Provider Notes (Signed)
[facility] Provider Note   CSN: [id number] Arrival date & time: 10/02/18  1219    History   Chief Complaint Chief Complaint  Patient presents with  . Chest Pain    HPI [facility] is a 78 y.o. female.     HPI  78 year old female, she has a known history of hypertension, acid reflux, denies history of diabetes tobacco use cholesterol or cardiac disease.  She presents to the hospital after her family members and her physician requested that she be evaluated for her chest pain.  This chest pain started 2 weeks ago and has been rather persistent, midsternal and feels like a pain that goes from her midsternum through to her back.  It started during a particularly emotional or stressful time when she had a falling out with some people that she worked for.  She has been at home gardening but continues to have a mild persistent pain.  Not worse with position or deep breathing and not associated with fevers coughing or shortness of breath.  Denies any cardiac disease, exertional symptoms, swelling of the legs or risk for pulmonary embolism.  She has not been taking anything other than Tylenol and the occasional aspirin.  Her doctor told her yesterday to come get evaluated, only after her family members urged her today that she come.  This time symptoms are mild  Past Medical History:  Diagnosis Date  . Allergy   . Anxiety    during a period of social upheaval  . Arthritis   . Bloating   . Cataract   . Colon polyps   . Diabetes mellitus type II    Borderline  . Diverticulosis of colon   . GERD (gastroesophageal reflux disease)   . Heartburn   . Hiatal hernia   . Hypertension   . Vitamin D deficiency     Patient Active Problem List   Diagnosis Date Noted  . Hyperglycemia 06/03/2018  . Vaginal dryness 06/03/2018  . Foot pain 09/01/2017  . Healthcare maintenance 08/09/2016  . Pain in knee joint 01/13/2015  . Osteopenia 01/16/2014  .  Medicare annual wellness visit, subsequent 01/10/2014  . Advance care planning 01/10/2014  . Vitamin D deficiency 06/14/2010  . GERD 03/15/2009  . UNSPECIFIED MENOPAUSAL&POSTMENOPAUSAL DISORDER 06/01/2008  . ANKLE EDEMA, CHRONIC 03/15/2008  . LEG CRAMPS 11/18/2006  . HYPERCHOLESTEROLEMIA 10/07/2006  . OBESITY 10/07/2006  . Essential hypertension 10/07/2006  . DIVERTICULOSIS, COLON 10/07/2006  . CONSTIPATION 10/07/2006    Past Surgical History:  Procedure Laterality Date  . CATARACT EXTRACTION     Bil  . COLONOSCOPY    . DILATION AND CURETTAGE OF UTERUS     1 miscarriage  . DILATION AND CURETTAGE OF UTERUS  01/2008   vaginal bleeding (Dr. Ronita Hipps)  . OOPHORECTOMY  1965   right due to cysts  . OOPHORECTOMY  1971   ovarian cystectomy left  . POLYPECTOMY    . VAGINAL DELIVERY     NSVD x 5     OB History   No obstetric history on file.      Home Medications    Prior to Admission medications   Medication Sig Start Date End Date Taking? Authorizing Provider  amLODipine (NORVASC) 10 MG tablet Take 1 tablet (10 mg total) by mouth daily. 08/29/17   Tonia Ghent, MD  celecoxib (CELEBREX) 200 MG capsule Take 1 capsule (200 mg total) by mouth daily. 08/08/16   Tonia Ghent, MD  metoprolol  succinate (TOPROL-XL) 50 MG 24 hr tablet Take 3 tablets (150 mg total) by mouth daily. 08/29/17   Tonia Ghent, MD  omeprazole (PRILOSEC) 40 MG capsule TAKE 1 CAPSULE BY MOUTH EVERY DAY 12/03/17   Tonia Ghent, MD  Vitamin D, Ergocalciferol, (DRISDOL) 1.25 MG (50000 UT) CAPS capsule Take 1 capsule (50,000 Units total) by mouth every 7 (seven) days. 06/03/18   Tonia Ghent, MD    Family History Family History  Problem Relation Age of Onset  . Hypertension Mother   . Heart disease Father        MI  . Hypertension Father   . Ulcers Brother        PUD  . Alcohol abuse Sister   . Alcohol abuse Sister   . Colon cancer Son   . Heart disease Brother   . Heart attack Brother   .  Breast cancer Neg Hx   . Esophageal cancer Neg Hx   . Rectal cancer Neg Hx   . Stomach cancer Neg Hx     Social History Social History   Tobacco Use  . Smoking status: Former Smoker    Types: Cigarettes    Last attempt to quit: 07/16/1973    Years since quitting: 45.2  . Smokeless tobacco: Never Used  . Tobacco comment: quit over 40 years ago  Substance Use Topics  . Alcohol use: No  . Drug use: No     Allergies   Aspirin; Hctz [hydrochlorothiazide]; and Lisinopril   Review of Systems Review of Systems  All other systems reviewed and are negative.    Physical Exam Updated Vital Signs BP (!) 173/86 (BP Location: Right Arm)   Pulse 71   Temp 98.5 F (36.9 C) (Oral)   Resp 14   Ht 1.6 m (5\' 3" )   Wt 88.5 kg   SpO2 100%   BMI 34.54 kg/m   Physical Exam Vitals signs and nursing note reviewed.  Constitutional:      General: She is not in acute distress.    Appearance: She is well-developed.  HENT:     Head: Normocephalic and atraumatic.     Mouth/Throat:     Pharynx: No oropharyngeal exudate.  Eyes:     General: No scleral icterus.       Right eye: No discharge.        Left eye: No discharge.     Conjunctiva/sclera: Conjunctivae normal.     Pupils: Pupils are equal, round, and reactive to light.  Neck:     Musculoskeletal: Normal range of motion and neck supple.     Thyroid: No thyromegaly.     Vascular: No JVD.  Cardiovascular:     Rate and Rhythm: Normal rate and regular rhythm.     Heart sounds: Normal heart sounds. No murmur. No friction rub. No gallop.   Pulmonary:     Effort: Pulmonary effort is normal. No respiratory distress.     Breath sounds: Normal breath sounds. No wheezing or rales.  Abdominal:     General: Bowel sounds are normal. There is no distension.     Palpations: Abdomen is soft. There is no mass.     Tenderness: There is no abdominal tenderness.  Musculoskeletal: Normal range of motion.        General: No tenderness.   Lymphadenopathy:     Cervical: No cervical adenopathy.  Skin:    General: Skin is warm and dry.     Findings: No erythema or  rash.  Neurological:     Mental Status: She is alert.     Coordination: Coordination normal.  Psychiatric:        Behavior: Behavior normal.      ED Treatments / Results  Labs (all labs ordered are listed, but only abnormal results are displayed) Labs Reviewed - No data to display  EKG EKG Interpretation  Date/Time:  Friday Oct 02 2018 12:37:36 EDT Ventricular Rate:  71 PR Interval:    QRS Duration: 85 QT Interval:  389 QTC Calculation: 423 R Axis:   23 Text Interpretation:  Sinus rhythm since last tracing no significant change Confirmed by Noemi Chapel [phone]) on 10/02/2018 12:43:52 PM   Radiology No results found.  Procedures Procedures (including critical care time)  Medications Ordered in ED Medications - No data to display   Initial Impression / Assessment and Plan / ED Course  I have reviewed the triage vital signs and the nursing notes.  Pertinent labs & imaging results that were available during my care of the patient were reviewed by me and considered in my medical decision making (see chart for details).  Clinical Course as of Oct 02 1447  Fri Oct 02, 2018  1447 Chest x-ray is totally unremarkable as is the blood work with a negative troponin.  The patient is very stable for discharge, she is agreeable to the plan.   [BM]    Clinical Course User Index [BM] Noemi Chapel, MD       The patient's exam is rather unremarkable, she has no reproducible tenderness, her EKG looks well, her cardiac rhythm is normal abdomen is soft and there is no edema or abnormal lung sounds.  No murmurs.  I suspect that given 2 weeks of constant symptoms this is less likely to be an acute coronary syndrome.  Troponin will tell and the patient will be discharged if negative to follow-up in the outpatient setting.  We will start low-dose aspirin daily  until cardiology follow-up.  Patient agreeable.  Final Clinical Impressions(s) / ED Diagnoses   Final diagnoses:  Chest pain, mid sternal      Noemi Chapel, MD 10/02/18 [phone]

## 2018-10-02 NOTE — ED Triage Notes (Signed)
Pt came in from home with c/o chest tightness in middle chest that radiates to back x2 weeks.  Reports nausea and headache.

## 2018-10-02 NOTE — ED Notes (Signed)
Patient transported to X-ray 

## 2018-10-08 ENCOUNTER — Other Ambulatory Visit: Payer: Self-pay

## 2018-10-08 ENCOUNTER — Encounter: Payer: Self-pay | Admitting: Family Medicine

## 2018-10-08 ENCOUNTER — Telehealth: Payer: Self-pay

## 2018-10-08 ENCOUNTER — Ambulatory Visit: Payer: Medicare Other | Admitting: Family Medicine

## 2018-10-08 DIAGNOSIS — K219 Gastro-esophageal reflux disease without esophagitis: Secondary | ICD-10-CM

## 2018-10-08 DIAGNOSIS — R42 Dizziness and giddiness: Secondary | ICD-10-CM

## 2018-10-08 MED ORDER — MECLIZINE HCL 25 MG PO TABS: 12.5000 mg | ORAL_TABLET | Freq: Three times a day (TID) | ORAL | 0 refills | Status: DC | PRN

## 2018-10-08 MED ORDER — OMEPRAZOLE 40 MG PO CPDR: DELAYED_RELEASE_CAPSULE | ORAL | Status: DC

## 2018-10-08 NOTE — Progress Notes (Signed)
~  3 weeks ago, she had a sig stressful event. She was really upset, and then she started having chest discomfort. Prev notes d/w pt.  ER course d/w pt.  Troponin negative.  She was discharged home.    When she would get upset in the meantime, the chest sx would return. She admits to being nervous and jittery.  She is safe at home.    She has some vertigo yesterday, with room spinning, episodically, self resolved.  She didn't feel presyncopal.    She has some episodic R>L ear pain.  No fevers.  No vomiting.  She doesn't have orthostatic sx.  She feels diffusely weak but not focally weak.    Recent CBC and BMET unremarkable.    Meds, vitals, and allergies reviewed.   ROS: Per HPI unless specifically indicated in ROS section   nad ncat Cerumen R>L, she can irrigate prn at home.   Neck supple, no LA rrr ctab abd soft, normal BS, not ttp.  Ext w/trace BLE edema.  CN 2-12 wnl B, S/S grossly wnl x4  Mild vertigo induced with head rotation to R and upward gaze.

## 2018-10-08 NOTE — Patient Instructions (Addendum)
Try taking omeprazole twice a day for now.  When better, cut back to once a day.  Use meclizine if needed for vertigo.  Use the beside exercises if needed.   Take care.  Glad to see you.  Update me as needed.

## 2018-10-08 NOTE — Telephone Encounter (Signed)
Pt left v/m having dizziness and wants to know what to do. I spoke with pt;for last few days pt has dizziness, both earaches and weakness, CP in chest and radiates to both sides of neck  and goes thru to back. CP not as bad as when seen in ED. BP 138/70 something on 10/06/18. Pt was seen in [facility] ED on 10/02/18 and was told testing done at ED. No fever,SOB,cough,chills,S/T,muscle pain,diarrhea, no loss of smell/taste. No travel and no known exposure to covid or flu.

## 2018-10-11 DIAGNOSIS — R42 Dizziness and giddiness: Secondary | ICD-10-CM | POA: Insufficient documentation

## 2018-10-11 NOTE — Assessment & Plan Note (Signed)
I question if higher stress social interactions have contributed.  Discussed.  Reasonable to try taking omeprazole twice a day for now.  When better, cut back to once a day.  Update me as needed.  She does not have exertional symptoms.

## 2018-10-11 NOTE — Assessment & Plan Note (Signed)
Reproduceable.  Benign exam.  Use meclizine if needed for vertigo.  Use beside exercises if needed.  She agrees with plan.

## 2018-10-15 ENCOUNTER — Other Ambulatory Visit: Payer: Self-pay | Admitting: Family Medicine

## 2018-10-22 NOTE — Telephone Encounter (Signed)
Pt had appt on 10/08/18.

## 2018-10-26 ENCOUNTER — Encounter (HOSPITAL_COMMUNITY): Payer: Self-pay | Admitting: Emergency Medicine

## 2018-10-26 ENCOUNTER — Ambulatory Visit (HOSPITAL_COMMUNITY)
Admission: EM | Admit: 2018-10-26 | Discharge: 2018-10-26 | Disposition: A | Discharge status: Home / Self Care | Payer: Medicare Other | Attending: Family Medicine | Admitting: Family Medicine

## 2018-10-26 ENCOUNTER — Other Ambulatory Visit: Payer: Self-pay

## 2018-10-26 DIAGNOSIS — J029 Acute pharyngitis, unspecified: Secondary | ICD-10-CM | POA: Diagnosis not present

## 2018-10-26 MED ORDER — FLUTICASONE PROPIONATE 50 MCG/ACT NA SUSP: 1.0000 | Freq: Every day | NASAL | 2 refills | Status: DC

## 2018-10-26 MED ORDER — CETIRIZINE HCL 10 MG PO TABS: 10.0000 mg | ORAL_TABLET | Freq: Every day | ORAL | 0 refills | Status: AC

## 2018-10-26 NOTE — ED Triage Notes (Signed)
Complains of sore throat for 2 weeks, throat started getting worse at the end of last week. Patient gets hoarse   Patient denies fever Denies cough.

## 2018-10-26 NOTE — ED Provider Notes (Signed)
[facility]    CSN: [id number] Arrival date & time: 10/26/18  1338     History   Chief Complaint Chief Complaint  Patient presents with  . Sore Throat    HPI Cassandra Cooke is a 78 y.o. female.   Patient is a 78 year old female past medical history of allergy, anxiety, arthritis, diabetes, GERD, hiatal hernia, hypertension.  She presents today with approximately 2 weeks of irritated, scratchy throat.  Reports some intermittent hoarseness and PND.  Symptoms worsened at the end of last week.  Has seasonal allergies and takes Allegra as needed.  She took 1 of these last night.  Denies any associated cough, chest congestion, fever, chills, body aches.  Denies any recent sick contacts or recent traveling.  Denies any known COVID exposures.  ROS per HPI      Past Medical History:  Diagnosis Date  . Allergy   . Anxiety    during a period of social upheaval  . Arthritis   . Bloating   . Cataract   . Colon polyps   . Diabetes mellitus type II    Borderline  . Diverticulosis of colon   . GERD (gastroesophageal reflux disease)   . Heartburn   . Hiatal hernia   . Hypertension   . Vitamin D deficiency     Patient Active Problem List   Diagnosis Date Noted  . Vertigo 10/11/2018  . Hyperglycemia 06/03/2018  . Vaginal dryness 06/03/2018  . Foot pain 09/01/2017  . Healthcare maintenance 08/09/2016  . Pain in knee joint 01/13/2015  . Osteopenia 01/16/2014  . Medicare annual wellness visit, subsequent 01/10/2014  . Advance care planning 01/10/2014  . Vitamin D deficiency 06/14/2010  . GERD 03/15/2009  . UNSPECIFIED MENOPAUSAL&POSTMENOPAUSAL DISORDER 06/01/2008  . ANKLE EDEMA, CHRONIC 03/15/2008  . LEG CRAMPS 11/18/2006  . HYPERCHOLESTEROLEMIA 10/07/2006  . OBESITY 10/07/2006  . Essential hypertension 10/07/2006  . DIVERTICULOSIS, COLON 10/07/2006  . CONSTIPATION 10/07/2006    Past Surgical History:  Procedure Laterality Date  . CATARACT EXTRACTION      Bil  . COLONOSCOPY    . DILATION AND CURETTAGE OF UTERUS     1 miscarriage  . DILATION AND CURETTAGE OF UTERUS  01/2008   vaginal bleeding (Dr. Ronita Hipps)  . OOPHORECTOMY  1965   right due to cysts  . OOPHORECTOMY  1971   ovarian cystectomy left  . POLYPECTOMY    . VAGINAL DELIVERY     NSVD x 5    OB History   No obstetric history on file.      Home Medications    Prior to Admission medications   Medication Sig Start Date End Date Taking? Authorizing Provider  amLODipine (NORVASC) 10 MG tablet TAKE 1 TABLET BY MOUTH EVERY DAY 10/15/18   Tonia Ghent, MD  celecoxib (CELEBREX) 200 MG capsule Take 1 capsule (200 mg total) by mouth daily. 08/08/16   Tonia Ghent, MD  cetirizine (ZYRTEC) 10 MG tablet Take 1 tablet (10 mg total) by mouth daily. 10/26/18   Lilli Dewald, Tressia Miners A, NP  fluticasone (FLONASE) 50 MCG/ACT nasal spray Place 1 spray into both nostrils daily. 10/26/18   Orvan July, NP  meclizine (ANTIVERT) 25 MG tablet Take 0.5-1 tablets (12.5-25 mg total) by mouth 3 (three) times daily as needed for dizziness (sedation caution). 10/08/18   Tonia Ghent, MD  metoprolol succinate (TOPROL-XL) 50 MG 24 hr tablet Take 3 tablets (150 mg total) by mouth daily. 08/29/17   Damita Dunnings,  Elveria Rising, MD  omeprazole (PRILOSEC) 40 MG capsule TAKE 1 CAPSULE BY MOUTH TWICE A DAY AS NEEDED. 10/08/18   Tonia Ghent, MD  Vitamin D, Ergocalciferol, (DRISDOL) 1.25 MG (50000 UT) CAPS capsule Take 1 capsule (50,000 Units total) by mouth every 7 (seven) days. 06/03/18   Tonia Ghent, MD    Family History Family History  Problem Relation Age of Onset  . Hypertension Mother   . Heart disease Father        MI  . Hypertension Father   . Ulcers Brother        PUD  . Alcohol abuse Sister   . Alcohol abuse Sister   . Colon cancer Son   . Heart disease Brother   . Heart attack Brother   . Breast cancer Neg Hx   . Esophageal cancer Neg Hx   . Rectal cancer Neg Hx   . Stomach cancer Neg Hx      Social History Social History   Tobacco Use  . Smoking status: Former Smoker    Types: Cigarettes    Quit date: 07/16/1973    Years since quitting: 45.3  . Smokeless tobacco: Never Used  . Tobacco comment: quit over 40 years ago  Substance Use Topics  . Alcohol use: No  . Drug use: No     Allergies   Aspirin, Hctz [hydrochlorothiazide], and Lisinopril   Review of Systems Review of Systems   Physical Exam Triage Vital Signs ED Triage Vitals  Enc Vitals Group     BP 10/26/18 1420 (!) 166/92     Pulse Rate 10/26/18 1420 74     Resp 10/26/18 1420 19     Temp 10/26/18 1420 98.5 F (36.9 C)     Temp Source 10/26/18 1420 Oral     SpO2 10/26/18 1420 96 %     Weight --      Height --      Head Circumference --      Peak Flow --      Pain Score 10/26/18 1418 5     Pain Loc --      Pain Edu? --      Excl. in [place]? --    No data found.  Updated Vital Signs BP (!) 166/92 (BP Location: Left Arm)   Pulse 74   Temp 98.5 F (36.9 C) (Oral)   Resp 19   SpO2 96%   Visual Acuity Right Eye Distance:   Left Eye Distance:   Bilateral Distance:    Right Eye Near:   Left Eye Near:    Bilateral Near:     Physical Exam Vitals signs and nursing note reviewed.  Constitutional:      Appearance: She is well-developed.  HENT:     Head: Normocephalic and atraumatic.     Right Ear: Tympanic membrane and ear canal normal.     Left Ear: Tympanic membrane and ear canal normal.     Mouth/Throat:     Pharynx: Oropharynx is clear. No pharyngeal swelling or posterior oropharyngeal erythema.     Tonsils: 0 on the right. 0 on the left.  Eyes:     Conjunctiva/sclera: Conjunctivae normal.  Neck:     Thyroid: No thyromegaly.  Cardiovascular:     Rate and Rhythm: Normal rate and regular rhythm.  Pulmonary:     Effort: Pulmonary effort is normal.     Breath sounds: Normal breath sounds.  Lymphadenopathy:     Cervical: No cervical adenopathy.  Skin:    General: Skin is warm and  dry.  Neurological:     Mental Status: She is alert.  Psychiatric:        Mood and Affect: Mood normal.      UC Treatments / Results  Labs (all labs ordered are listed, but only abnormal results are displayed) Labs Reviewed - No data to display  EKG None  Radiology No results found.  Procedures Procedures (including critical care time)  Medications Ordered in UC Medications - No data to display  Initial Impression / Assessment and Plan / UC Course  I have reviewed the triage vital signs and the nursing notes.  Pertinent labs & imaging results that were available during my care of the patient were reviewed by me and considered in my medical decision making (see chart for details).     Symptoms consistent with allergies We will have her start using Flonase and Zyrtec daily Recommended if her symptoms continue or worsen she will need to follow-up with her primary care doctor for further evaluation Final Clinical Impressions(s) / UC Diagnoses   Final diagnoses:  Sore throat     Discharge Instructions     I believe that your symptoms are allergy related.  Start taking the Flonase and Zyrtec daily. If symptoms continue or worsen over the next 3 to 4 days despite taking medication daily you need to follow-up with your doctor for further evaluation management. Follow up as needed for continued or worsening symptoms     ED Prescriptions    Medication Sig Dispense Auth. Provider   fluticasone (FLONASE) 50 MCG/ACT nasal spray Place 1 spray into both nostrils daily. 16 g Jocie Meroney A, NP   cetirizine (ZYRTEC) 10 MG tablet Take 1 tablet (10 mg total) by mouth daily. 30 tablet Loura Halt A, NP     Controlled Substance Prescriptions [place] Controlled Substance Registry consulted? Not Applicable   Orvan July, NP 10/26/18 1552

## 2018-10-26 NOTE — Discharge Instructions (Addendum)
I believe that your symptoms are allergy related.  Start taking the Flonase and Zyrtec daily. If symptoms continue or worsen over the next 3 to 4 days despite taking medication daily you need to follow-up with your doctor for further evaluation management. Follow up as needed for continued or worsening symptoms

## 2018-12-08 ENCOUNTER — Other Ambulatory Visit: Payer: Self-pay

## 2018-12-08 DIAGNOSIS — Z20822 Contact with and (suspected) exposure to covid-19: Secondary | ICD-10-CM

## 2018-12-09 LAB — NOVEL CORONAVIRUS, NAA: SARS-CoV-2, NAA: NOT DETECTED

## 2018-12-10 ENCOUNTER — Telehealth: Payer: Self-pay | Admitting: Family Medicine

## 2018-12-10 NOTE — Telephone Encounter (Signed)
Pt calling to get COVID results, informed her those are negative.

## 2018-12-11 ENCOUNTER — Telehealth: Payer: Self-pay | Admitting: Family Medicine

## 2018-12-11 ENCOUNTER — Other Ambulatory Visit: Payer: Self-pay | Admitting: Family Medicine

## 2018-12-11 MED ORDER — METOPROLOL SUCCINATE ER 50 MG PO TB24: 150.0000 mg | ORAL_TABLET | Freq: Every day | ORAL | 2 refills | Status: DC

## 2018-12-11 NOTE — Telephone Encounter (Signed)
PA with home health called in regards to the patient. She scheduled an appointment for the next available day.    PA stated that the patient has been without her Vitamin D prescription for a few days now and she stated the patient is having some body aches.   Patient has already had a covid test and results came back last week negative but they do not think this is covid related.   She stated they sent over a refill request for the Vitamin D prescription so it can be refilled.  FYI

## 2018-12-11 NOTE — Telephone Encounter (Signed)
Per last lab results patient was to take Vitamin D RX for 3 months and then re check levels. Patient has an appointment on 12/14/2018. Will let Dr. Damita Dunnings review for that day. Also review Celebrex refill request.

## 2018-12-13 NOTE — Telephone Encounter (Signed)
Will hold refill request for OV tomorrow.

## 2018-12-13 NOTE — Telephone Encounter (Signed)
Will address at office visit.  Thanks.

## 2018-12-14 ENCOUNTER — Other Ambulatory Visit: Payer: Self-pay

## 2018-12-14 ENCOUNTER — Ambulatory Visit: Payer: Medicare Other | Admitting: Family Medicine

## 2018-12-14 MED ORDER — AMLODIPINE BESYLATE 10 MG PO TABS: 10.0000 mg | ORAL_TABLET | Freq: Every day | ORAL | 1 refills | Status: DC

## 2018-12-14 MED ORDER — OMEPRAZOLE 40 MG PO CPDR: DELAYED_RELEASE_CAPSULE | ORAL | 1 refills | Status: DC

## 2018-12-14 MED ORDER — FLUTICASONE PROPIONATE 50 MCG/ACT NA SUSP: 1.0000 | Freq: Every day | NASAL | 1 refills | Status: AC

## 2018-12-14 NOTE — Telephone Encounter (Signed)
Sent to [facility]  

## 2018-12-15 NOTE — Telephone Encounter (Signed)
Patient called and cancelled her appointment on 12/14/2018. Note states patient would call back to reschedule.

## 2018-12-16 MED ORDER — CELECOXIB 200 MG PO CAPS: 200.0000 mg | ORAL_CAPSULE | Freq: Every day | ORAL | 1 refills | Status: DC

## 2018-12-16 NOTE — Telephone Encounter (Signed)
celebrex sent but we need to recheck vit D prior to refilling that.  OV when possible. Thanks.

## 2018-12-17 NOTE — Telephone Encounter (Signed)
Patient advised and appointment made for 12/22/2018

## 2018-12-21 ENCOUNTER — Other Ambulatory Visit: Payer: Self-pay | Admitting: *Deleted

## 2018-12-21 ENCOUNTER — Telehealth: Payer: Self-pay | Admitting: Family Medicine

## 2018-12-21 MED ORDER — OMEPRAZOLE 40 MG PO CPDR: DELAYED_RELEASE_CAPSULE | ORAL | 0 refills | Status: DC

## 2018-12-21 MED ORDER — CELECOXIB 200 MG PO CAPS: 200.0000 mg | ORAL_CAPSULE | Freq: Every day | ORAL | 0 refills | Status: DC

## 2018-12-21 NOTE — Telephone Encounter (Signed)
Patient was scheduled to see Dr.Duncan on 12/22/18 and I called to r/s her appointment to 12/29/18.  Patient's out of her Celebrex and Omeprazole.  Patient was told she couldn't get a refill on the medications until she was seen.  Patient wants to know if she can get a refill until her appointment on 12/29/18. Patient uses [facility]

## 2018-12-21 NOTE — Telephone Encounter (Signed)
Refills sent

## 2018-12-22 ENCOUNTER — Ambulatory Visit: Payer: Medicare Other | Admitting: Family Medicine

## 2018-12-23 ENCOUNTER — Other Ambulatory Visit: Payer: Self-pay

## 2018-12-23 ENCOUNTER — Ambulatory Visit
Admission: EM | Admit: 2018-12-23 | Discharge: 2018-12-23 | Disposition: A | Discharge status: Home / Self Care | Payer: Medicare Other | Attending: Emergency Medicine | Admitting: Emergency Medicine

## 2018-12-23 DIAGNOSIS — M546 Pain in thoracic spine: Secondary | ICD-10-CM

## 2018-12-23 DIAGNOSIS — I10 Essential (primary) hypertension: Secondary | ICD-10-CM | POA: Diagnosis not present

## 2018-12-23 MED ORDER — TIZANIDINE HCL 2 MG PO TABS: 2.0000 mg | ORAL_TABLET | Freq: Four times a day (QID) | ORAL | 0 refills | Status: AC | PRN

## 2018-12-23 NOTE — Discharge Instructions (Signed)
Take muscle relaxer as needed: Do not drink or drive lesions medication. Go to ER for further evaluation if you develop worsening pain, shortness of breath, lightheadedness, fever.

## 2018-12-23 NOTE — ED Triage Notes (Signed)
Pt c/o mid to upper back pain radiating to lt arm x2wks. Denies injury. States went to ED 2 months ago and had a normal cardiac workup.

## 2018-12-23 NOTE — ED Provider Notes (Signed)
[facility]    CSN: [id number] Arrival date & time: 12/23/18  1023     History   Chief Complaint Chief Complaint  Patient presents with  . Back Pain    HPI Cassandra Cooke is a 78 y.o. female with history of hypertension, diabetes, GERD presenting for 2-week course of mid to upper back pain with left arm radiation constantly for the last 2 weeks.  Patient denies trauma to the area, inciting event.  Patient has taken Celebrex with moderate relief of symptoms.  Patient denies lightheadedness, dizziness, chest pain, shortness of breath.  Patient states she went to the ER 2 months ago for chest pain and had normal cardiac work-up: Records reviewed by me shows negative troponin on 5/29 with unremarkable CBC and BMP.  Patient reports good appetite, energy level.    Past Medical History:  Diagnosis Date  . Allergy   . Anxiety    during a period of social upheaval  . Arthritis   . Bloating   . Cataract   . Colon polyps   . Diabetes mellitus type II    Borderline  . Diverticulosis of colon   . GERD (gastroesophageal reflux disease)   . Heartburn   . Hiatal hernia   . Hypertension   . Vitamin D deficiency     Patient Active Problem List   Diagnosis Date Noted  . Vertigo 10/11/2018  . Hyperglycemia 06/03/2018  . Vaginal dryness 06/03/2018  . Foot pain 09/01/2017  . Healthcare maintenance 08/09/2016  . Pain in knee joint 01/13/2015  . Osteopenia 01/16/2014  . Medicare annual wellness visit, subsequent 01/10/2014  . Advance care planning 01/10/2014  . Vitamin D deficiency 06/14/2010  . GERD 03/15/2009  . UNSPECIFIED MENOPAUSAL&POSTMENOPAUSAL DISORDER 06/01/2008  . ANKLE EDEMA, CHRONIC 03/15/2008  . LEG CRAMPS 11/18/2006  . HYPERCHOLESTEROLEMIA 10/07/2006  . OBESITY 10/07/2006  . Essential hypertension 10/07/2006  . DIVERTICULOSIS, COLON 10/07/2006  . CONSTIPATION 10/07/2006    Past Surgical History:  Procedure Laterality Date  . CATARACT EXTRACTION      Bil  . COLONOSCOPY    . DILATION AND CURETTAGE OF UTERUS     1 miscarriage  . DILATION AND CURETTAGE OF UTERUS  01/2008   vaginal bleeding (Dr. Ronita Hipps)  . OOPHORECTOMY  1965   right due to cysts  . OOPHORECTOMY  1971   ovarian cystectomy left  . POLYPECTOMY    . VAGINAL DELIVERY     NSVD x 5    OB History   No obstetric history on file.      Home Medications    Prior to Admission medications   Medication Sig Start Date End Date Taking? Authorizing Provider  amLODipine (NORVASC) 10 MG tablet Take 1 tablet (10 mg total) by mouth daily. 12/14/18   Tonia Ghent, MD  celecoxib (CELEBREX) 200 MG capsule Take 1 capsule (200 mg total) by mouth daily. 12/21/18   Tonia Ghent, MD  cetirizine (ZYRTEC) 10 MG tablet Take 1 tablet (10 mg total) by mouth daily. 10/26/18   Bast, Tressia Miners A, NP  fluticasone (FLONASE) 50 MCG/ACT nasal spray Place 1 spray into both nostrils daily. 12/14/18   Tonia Ghent, MD  meclizine (ANTIVERT) 25 MG tablet Take 0.5-1 tablets (12.5-25 mg total) by mouth 3 (three) times daily as needed for dizziness (sedation caution). 10/08/18   Tonia Ghent, MD  metoprolol succinate (TOPROL-XL) 50 MG 24 hr tablet Take 3 tablets (150 mg total) by mouth daily. 12/11/18  Tonia Ghent, MD  omeprazole (PRILOSEC) 40 MG capsule TAKE 1 CAPSULE BY MOUTH TWICE A DAY AS NEEDED. 12/21/18   Tonia Ghent, MD  tiZANidine (ZANAFLEX) 2 MG tablet Take 1 tablet (2 mg total) by mouth every 6 (six) hours as needed for up to 5 days for muscle spasms. 12/23/18 12/28/18  Hall-Potvin, [place], PA-C  Vitamin D, Ergocalciferol, (DRISDOL) 1.25 MG (50000 UT) CAPS capsule Take 1 capsule (50,000 Units total) by mouth every 7 (seven) days. 06/03/18   Tonia Ghent, MD    Family History Family History  Problem Relation Age of Onset  . Hypertension Mother   . Heart disease Father        MI  . Hypertension Father   . Ulcers Brother        PUD  . Alcohol abuse Sister   . Alcohol abuse  Sister   . Colon cancer Son   . Heart disease Brother   . Heart attack Brother   . Breast cancer Neg Hx   . Esophageal cancer Neg Hx   . Rectal cancer Neg Hx   . Stomach cancer Neg Hx     Social History Social History   Tobacco Use  . Smoking status: Former Smoker    Types: Cigarettes    Quit date: 07/16/1973    Years since quitting: 45.4  . Smokeless tobacco: Never Used  . Tobacco comment: quit over 40 years ago  Substance Use Topics  . Alcohol use: No  . Drug use: No     Allergies   Aspirin, Hctz [hydrochlorothiazide], and Lisinopril   Review of Systems Review of Systems  Constitutional: Negative for fatigue and fever.  HENT: Negative for ear pain, sinus pain, sore throat and voice change.   Eyes: Negative for pain, redness and visual disturbance.  Respiratory: Negative for cough and shortness of breath.   Cardiovascular: Negative for chest pain and palpitations.  Gastrointestinal: Negative for abdominal pain, diarrhea and vomiting.  Musculoskeletal: Positive for back pain. Negative for arthralgias, gait problem, myalgias and neck pain.  Skin: Negative for rash and wound.  Neurological: Negative for syncope and headaches.     Physical Exam Triage Vital Signs ED Triage Vitals  Enc Vitals Group     BP 12/23/18 1035 (!) 168/97     Pulse Rate 12/23/18 1035 74     Resp 12/23/18 1035 18     Temp 12/23/18 1035 97.8 F (36.6 C)     Temp Source 12/23/18 1035 Oral     SpO2 12/23/18 1035 97 %     Weight --      Height --      Head Circumference --      Peak Flow --      Pain Score 12/23/18 1036 8     Pain Loc --      Pain Edu? --      Excl. in [place]? --    No data found.  Updated Vital Signs BP (!) 168/97 (BP Location: Left Arm) Comment: states hasn't taken her B/P meds this am yet  Pulse 74   Temp 97.8 F (36.6 C) (Oral)   Resp 18   SpO2 97%   Visual Acuity Right Eye Distance:   Left Eye Distance:   Bilateral Distance:    Right Eye Near:   Left Eye  Near:    Bilateral Near:     Physical Exam Constitutional:      General: She is not in acute distress.  HENT:     Head: Normocephalic and atraumatic.  Eyes:     General: No scleral icterus.    Conjunctiva/sclera: Conjunctivae normal.     Pupils: Pupils are equal, round, and reactive to light.  Cardiovascular:     Rate and Rhythm: Normal rate.  Pulmonary:     Effort: Pulmonary effort is normal. No respiratory distress.  Musculoskeletal:     Comments: Obvious bony deformity or scoliosis of cervical, thoracic, lumbar spine.  No vertebral tenderness.  Patient has focal tenderness to palpation left of his mid thoracic paraspinals without fluctuance, warmth, or mass appreciated.  5/5 strength in upper extremities bilaterally.  NVI.  Neurological:     General: No focal deficit present.     Mental Status: She is alert.      UC Treatments / Results  Labs (all labs ordered are listed, but only abnormal results are displayed) Labs Reviewed - No data to display  EKG   Radiology No results found.  Procedures Procedures (including critical care time)  Medications Ordered in UC Medications - No data to display  Initial Impression / Assessment and Plan / UC Course  I have reviewed the triage vital signs and the nursing notes.  Pertinent labs & imaging results that were available during my care of the patient were reviewed by me and considered in my medical decision making (see chart for details).     1.  Acute left-sided thoracic back pain EKG done in office due to radiation through to chest in conjunction with relief with Celebrex.  EKG reviewed by me, and compared to previous from 10/02/18 (10/07/2018 EKG file unable to open): Sinus rhythm with PAC and right atrial enlargement.  No QTC prolongation, ST elevation.  Waveforms stable in all leads other than T wave now upright in V1.  Will trial tizanidine for muscle pain.  Return precautions discussed, patient verbalized understanding  and is agreeable to plan. Final Clinical Impressions(s) / UC Diagnoses   Final diagnoses:  Acute left-sided thoracic back pain     Discharge Instructions     Take muscle relaxer as needed: Do not drink or drive lesions medication. Go to ER for further evaluation if you develop worsening pain, shortness of breath, lightheadedness, fever.    ED Prescriptions    Medication Sig Dispense Auth. Provider   tiZANidine (ZANAFLEX) 2 MG tablet Take 1 tablet (2 mg total) by mouth every 6 (six) hours as needed for up to 5 days for muscle spasms. 20 tablet Hall-Potvin, [place], PA-C     Controlled Substance Prescriptions [place] Controlled Substance Registry consulted? Not Applicable   Cassandra Cooke, [place] 12/23/18 1824

## 2018-12-29 ENCOUNTER — Encounter: Payer: Self-pay | Admitting: Family Medicine

## 2018-12-29 ENCOUNTER — Ambulatory Visit (INDEPENDENT_AMBULATORY_CARE_PROVIDER_SITE_OTHER): Payer: Medicare Other | Admitting: Family Medicine

## 2018-12-29 ENCOUNTER — Ambulatory Visit (INDEPENDENT_AMBULATORY_CARE_PROVIDER_SITE_OTHER)
Admission: RE | Admit: 2018-12-29 | Discharge: 2018-12-29 | Disposition: A | Discharge status: Home / Self Care | Payer: Medicare Other | Source: Ambulatory Visit | Attending: Family Medicine | Admitting: Family Medicine

## 2018-12-29 ENCOUNTER — Other Ambulatory Visit: Payer: Self-pay

## 2018-12-29 VITALS — BP 142/70 | HR 73 | Temp 97.8°F | Ht 63.0 in | Wt 191.4 lb

## 2018-12-29 DIAGNOSIS — M25519 Pain in unspecified shoulder: Secondary | ICD-10-CM

## 2018-12-29 DIAGNOSIS — E559 Vitamin D deficiency, unspecified: Secondary | ICD-10-CM

## 2018-12-29 LAB — VITAMIN D 25 HYDROXY (VIT D DEFICIENCY, FRACTURES): VITD: 21.03 ng/mL — ABNORMAL LOW (ref 30.00–100.00)

## 2018-12-29 MED ORDER — TRAMADOL HCL 50 MG PO TABS: 50.0000 mg | ORAL_TABLET | Freq: Three times a day (TID) | ORAL | 0 refills | Status: AC | PRN

## 2018-12-29 NOTE — Progress Notes (Signed)
H/o low vit D.  She was prev on weekly replacement but off now.  Not on replacement o/w now.   UC eval recently done.  She had chest wall pain.  Was out of celebrex.  Pain with L arm ROM and L chest wall pain.  She has trouble getting celebrex filled due to mail order.  Celebrex clearly helped.  No trauma.  Still with L arm pain but L thorax pain is better.    Meds, vitals, and allergies reviewed.   ROS: Per HPI unless specifically indicated in ROS section   GEN: nad, alert and oriented HEENT: ncat NECK: supple w/o LA CV: rrr. PULM: ctab, no inc wob EXT: no edema SKIN: no acute rash Left shoulder with small lipoma noted.  This likely an incidental finding is not tender.  Left arm with normal range of motion on pendulum swings but she has pain with internal and external rotation.  No arm drop. AC joint nontender.  No local bruising.

## 2018-12-29 NOTE — Patient Instructions (Signed)
  Go to the lab on the way out.  We'll contact you with your lab and xray report.  Pendulum exercises for your shoulder in the meantime.  Celebrex/tylenol/tramadol as needed for pain.  Take care.  Glad to see you.

## 2018-12-30 DIAGNOSIS — M25519 Pain in unspecified shoulder: Secondary | ICD-10-CM | POA: Insufficient documentation

## 2018-12-30 MED ORDER — VITAMIN D (ERGOCALCIFEROL) 1.25 MG (50000 UNIT) PO CAPS: 50000.0000 [IU] | ORAL_CAPSULE | ORAL | 0 refills | Status: DC

## 2018-12-30 NOTE — Assessment & Plan Note (Signed)
Likely rotator cuff irritation but I would like to image her shoulder first, just to get plain films done.  I think the lipoma is an incidental finding.  See notes on imaging.  Discussed continued pendulum swings in the meantime to prevent loss of range of motion.  She understood.

## 2018-12-30 NOTE — Assessment & Plan Note (Signed)
See notes on labs.  Off replacement currently.

## 2019-01-12 NOTE — Progress Notes (Deleted)
Cassandra Cooke T. Kenecia Barren, MD Primary Care and Sports Medicine [facility] at [facility] [place], [address] Phone: [phone]  FAX: [phone]  Cassandra Cooke - 78 y.o. female  MRN [id number]  Date of Birth: [birth year]  Visit Date: 01/13/2019  PCP: Tonia Ghent, MD  Referred by: Tonia Ghent, MD  No chief complaint on file.  Subjective:   Cassandra Cooke is a 78 y.o. very pleasant female patient with There is no height or weight on file to calculate BMI. who presents with the following:  She presents with a longstanding history of deep shoulder pain.  She is here for evaluation and consultation courtesy of Dr. Damita Dunnings.  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    Past Medical History, Surgical History, Social History, Family History, Problem List, Medications, and Allergies have been reviewed and updated if relevant.  Patient Active Problem List   Diagnosis Date Noted  . Shoulder pain 12/30/2018  . Vertigo 10/11/2018  . Hyperglycemia 06/03/2018  . Vaginal dryness 06/03/2018  . Foot pain 09/01/2017  . Healthcare maintenance 08/09/2016  . Pain in knee joint 01/13/2015  . Osteopenia 01/16/2014  . Medicare annual wellness visit, subsequent 01/10/2014  . Advance care planning 01/10/2014  . Vitamin D deficiency 06/14/2010  . GERD 03/15/2009  . UNSPECIFIED MENOPAUSAL&POSTMENOPAUSAL DISORDER 06/01/2008  . ANKLE EDEMA, CHRONIC 03/15/2008  . LEG CRAMPS 11/18/2006  . HYPERCHOLESTEROLEMIA 10/07/2006  . OBESITY 10/07/2006  . Essential hypertension 10/07/2006  . DIVERTICULOSIS, COLON 10/07/2006  . CONSTIPATION 10/07/2006    Past Medical History:  Diagnosis Date  . Allergy   . Anxiety    during a period of social upheaval  . Arthritis   . Bloating   . Cataract   . Colon polyps   .  Diabetes mellitus type II    Borderline  . Diverticulosis of colon   . GERD (gastroesophageal reflux disease)   . Heartburn   . Hiatal hernia   . Hypertension   . Vitamin D deficiency     Past Surgical History:  Procedure Laterality Date  . CATARACT EXTRACTION     Bil  . COLONOSCOPY    . DILATION AND CURETTAGE OF UTERUS     1 miscarriage  . DILATION AND CURETTAGE OF UTERUS  01/2008   vaginal bleeding (Dr. Ronita Hipps)  . OOPHORECTOMY  1965   right due to cysts  . OOPHORECTOMY  1971   ovarian cystectomy left  . POLYPECTOMY    . VAGINAL DELIVERY     NSVD x 5    Social History   Socioeconomic History  . Marital status: Widowed    Spouse name: Not on file  . Number of children: 5  . Years of education: Not on file  . Highest education level: Not on file  Occupational History  . Occupation: Retired    Fish farm manager: RETIRED  Social Needs  . Financial resource strain: Not on file  . Food insecurity    Worry: Not on file    Inability: Not on file  . Transportation needs    Medical: Not on file    Non-medical: Not on file  Tobacco Use  . Smoking status: Former Smoker    Types: Cigarettes    Quit date: 07/16/1973    Years since quitting: 45.5  . Smokeless tobacco: Never Used  .  Tobacco comment: quit over 40 years ago  Substance and Sexual Activity  . Alcohol use: No  . Drug use: No  . Sexual activity: Never  Lifestyle  . Physical activity    Days per week: Not on file    Minutes per session: Not on file  . Stress: Not on file  Relationships  . Social Herbalist on phone: Not on file    Gets together: Not on file    Attends religious service: Not on file    Active member of club or organization: Not on file    Attends meetings of clubs or organizations: Not on file    Relationship status: Not on file  . Intimate partner violence    Fear of current or ex partner: Not on file    Emotionally abused: Not on file    Physically abused: Not on file    Forced  sexual activity: Not on file  Other Topics Concern  . Not on file  Social History Narrative   1st husband died of cancer.    Widowed August 27, 2011, he had prostate cancer.    H/o mult foster children      1 daughter died of renal disease   1 son with h/o colon cancer   2 other sons and 1 daughter healthy    Family History  Problem Relation Age of Onset  . Hypertension Mother   . Heart disease Father        MI  . Hypertension Father   . Ulcers Brother        PUD  . Alcohol abuse Sister   . Alcohol abuse Sister   . Colon cancer Son   . Heart disease Brother   . Heart attack Brother   . Breast cancer Neg Hx   . Esophageal cancer Neg Hx   . Rectal cancer Neg Hx   . Stomach cancer Neg Hx     Allergies  Allergen Reactions  . Aspirin     REACTION: GI upset with high dose  . Hctz [Hydrochlorothiazide] Other (See Comments)    cramping  . Lisinopril     Causes cough    Medication list reviewed and updated in full in [place].  GEN: No fevers, chills. Nontoxic. Primarily MSK c/o today. MSK: Detailed in the HPI GI: tolerating PO intake without difficulty Neuro: No numbness, parasthesias, or tingling associated. Otherwise the pertinent positives of the ROS are noted above.   Objective:   There were no vitals taken for this visit.  ***  Radiology: Dg Shoulder Left  Result Date: 12/30/2018 CLINICAL DATA:  L shoulder pain EXAM: LEFT SHOULDER - 2+ VIEW COMPARISON:  None. FINDINGS: There is no evidence of fracture or dislocation. Moderate degenerative changes the glenohumeral and acromioclavicular joints. No suspicious bony lesion. Soft tissues are unremarkable. Adjacent ribs and lungs without acute abnormality. IMPRESSION: Moderate degenerative changes in the left shoulder without acute osseous abnormality. Electronically Signed   By: Audie Pinto M.D.   On: 12/30/2018 08:21    Assessment and Plan:   ***

## 2019-01-13 ENCOUNTER — Ambulatory Visit: Payer: Medicare Other | Admitting: Family Medicine

## 2019-01-13 DIAGNOSIS — Z0289 Encounter for other administrative examinations: Secondary | ICD-10-CM

## 2019-02-12 IMAGING — DX DG CHEST 2V
2 series · 2 of 2 positions shown · non-contrast
Comparison: 08/09/2008

CLINICAL DATA: Cough and chest pain for 3 weeks.

EXAM:
CHEST  2 VIEW

[chest pa]
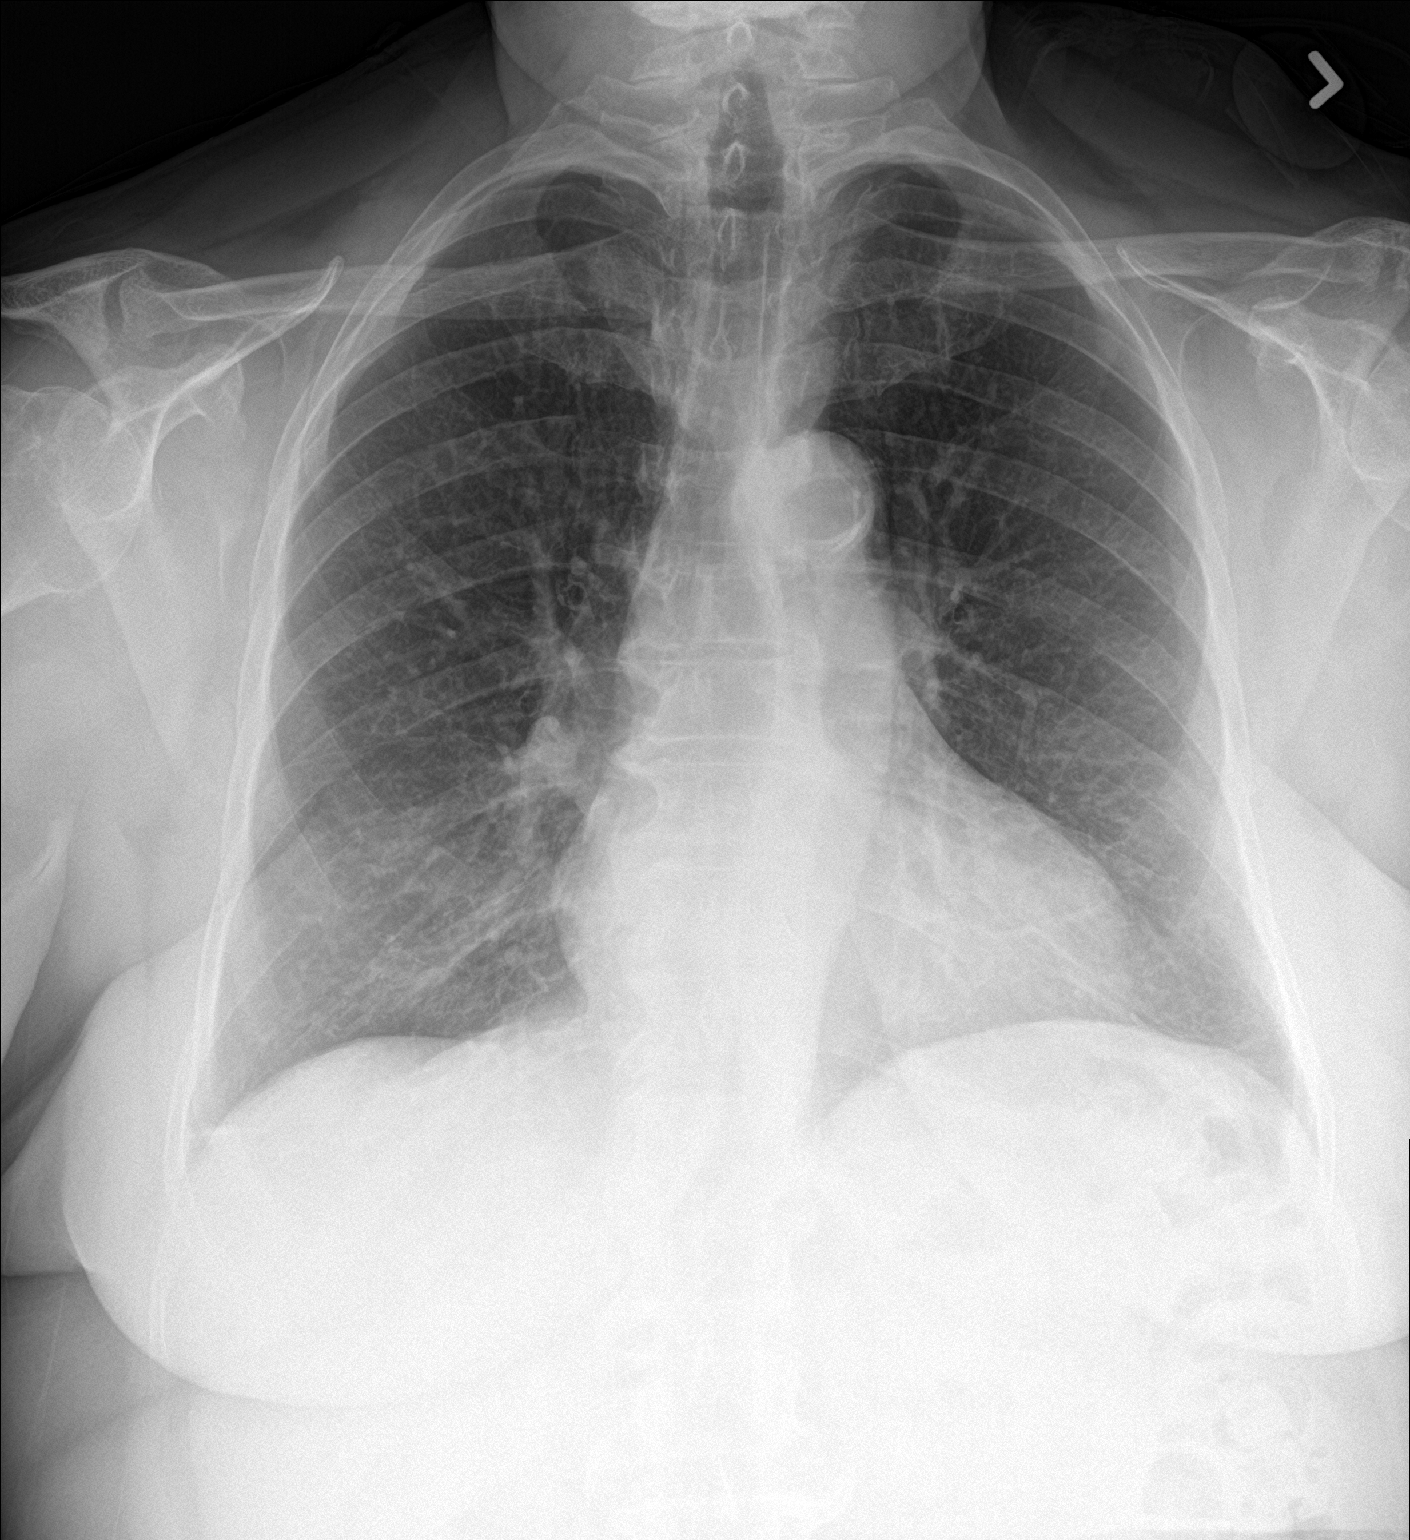

[chest lat]
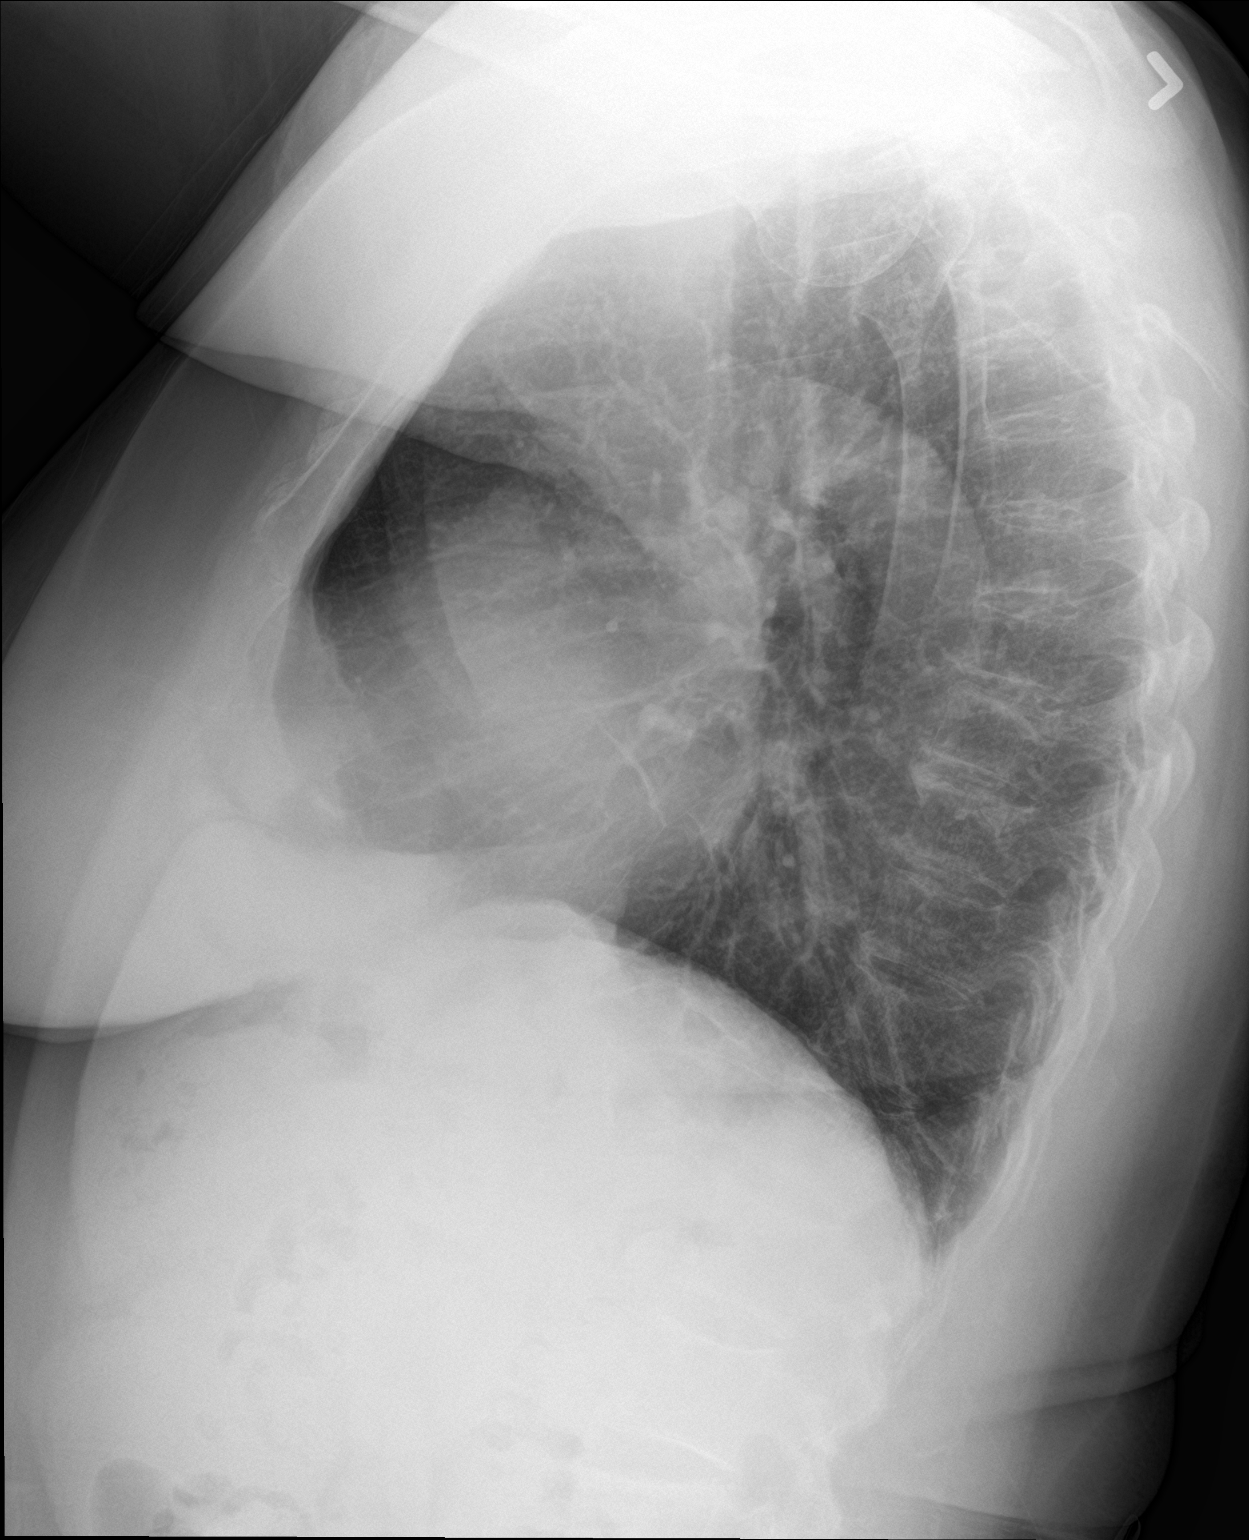

[2 of 2 positions shown; findings below may reference images not displayed]

FINDINGS: Upper limits normal heart size noted.

There is no evidence of focal airspace disease, pulmonary edema,
suspicious pulmonary nodule/mass, pleural effusion, or pneumothorax.

No acute bony abnormalities are identified.
IMPRESSION: No active cardiopulmonary disease.

## 2019-03-30 ENCOUNTER — Other Ambulatory Visit (INDEPENDENT_AMBULATORY_CARE_PROVIDER_SITE_OTHER): Payer: Medicare Other

## 2019-03-30 DIAGNOSIS — E559 Vitamin D deficiency, unspecified: Secondary | ICD-10-CM | POA: Diagnosis not present

## 2019-03-30 LAB — VITAMIN D 25 HYDROXY (VIT D DEFICIENCY, FRACTURES): VITD: 23.27 ng/mL — ABNORMAL LOW (ref 30.00–100.00)

## 2019-03-31 ENCOUNTER — Other Ambulatory Visit: Payer: Self-pay | Admitting: Family Medicine

## 2019-03-31 DIAGNOSIS — E559 Vitamin D deficiency, unspecified: Secondary | ICD-10-CM

## 2019-03-31 MED ORDER — VITAMIN D (ERGOCALCIFEROL) 1.25 MG (50000 UNIT) PO CAPS: 50000.0000 [IU] | ORAL_CAPSULE | ORAL | 0 refills | Status: DC

## 2019-05-20 ENCOUNTER — Ambulatory Visit: Payer: Medicare PPO | Attending: Internal Medicine

## 2019-05-20 DIAGNOSIS — Z20822 Contact with and (suspected) exposure to covid-19: Secondary | ICD-10-CM

## 2019-05-22 LAB — NOVEL CORONAVIRUS, NAA: SARS-CoV-2, NAA: NOT DETECTED

## 2019-05-24 ENCOUNTER — Telehealth: Payer: Self-pay | Admitting: General Practice

## 2019-05-24 NOTE — Telephone Encounter (Signed)
Negative COVID results given. Patient results "NOT Detected." [name] expressed understanding. ° °

## 2019-05-25 ENCOUNTER — Telehealth: Payer: Self-pay

## 2019-05-25 ENCOUNTER — Ambulatory Visit
Admission: EM | Admit: 2019-05-25 | Discharge: 2019-05-25 | Disposition: A | Discharge status: Home / Self Care | Payer: Medicare PPO | Attending: Physician Assistant | Admitting: Physician Assistant

## 2019-05-25 ENCOUNTER — Other Ambulatory Visit: Payer: Self-pay

## 2019-05-25 ENCOUNTER — Telehealth: Payer: Self-pay | Admitting: Adult Health

## 2019-05-25 DIAGNOSIS — R509 Fever, unspecified: Secondary | ICD-10-CM

## 2019-05-25 DIAGNOSIS — U071 COVID-19: Secondary | ICD-10-CM

## 2019-05-25 DIAGNOSIS — I10 Essential (primary) hypertension: Secondary | ICD-10-CM

## 2019-05-25 DIAGNOSIS — R000 Tachycardia, unspecified: Secondary | ICD-10-CM | POA: Diagnosis not present

## 2019-05-25 LAB — POCT INFLUENZA A/B
Influenza A, POC: NEGATIVE
Influenza B, POC: NEGATIVE

## 2019-05-25 LAB — POC SARS CORONAVIRUS 2 AG -  ED: SARS Coronavirus 2 Ag: POSITIVE — AB

## 2019-05-25 MED ORDER — ACETAMINOPHEN 325 MG PO TABS
975.0000 mg | ORAL_TABLET | Freq: Once | ORAL | Status: AC
  Administered 2019-05-25: 975 mg via ORAL

## 2019-05-25 NOTE — ED Provider Notes (Signed)
[facility]    CSN: [id number] Arrival date & time: 05/25/19  1616      History   Chief Complaint Chief Complaint  Patient presents with  . Fever    HPI Cassandra Cooke is a 79 y.o. female.   79 year old female comes in for 2 day history of fever and cough. She had a COVID test 6 days ago. Stated she had had body aches for many weeks, and just wanted to make sure COVID was negative. COVID test 6 days ago was negative. However, started having fever and cough yesterday and therefore came in for evaluation. tmax 101 at home, has not taken any medications. Cough is productive. Sore throat. Denies abdominal pain, nausea, vomiting, diarrhea. Denies shortness of breath, loss of taste/smell. Former smoker.   History of Pre DM, last a1c 6.2 History of HTN, no history of heart disease.       Past Medical History:  Diagnosis Date  . Allergy   . Anxiety    during a period of social upheaval  . Arthritis   . Bloating   . Cataract   . Colon polyps   . Diabetes mellitus type II    Borderline  . Diverticulosis of colon   . GERD (gastroesophageal reflux disease)   . Heartburn   . Hiatal hernia   . Hypertension   . Vitamin D deficiency     Patient Active Problem List   Diagnosis Date Noted  . Shoulder pain 12/30/2018  . Vertigo 10/11/2018  . Hyperglycemia 06/03/2018  . Vaginal dryness 06/03/2018  . Foot pain 09/01/2017  . Healthcare maintenance 08/09/2016  . Pain in knee joint 01/13/2015  . Osteopenia 01/16/2014  . Medicare annual wellness visit, subsequent 01/10/2014  . Advance care planning 01/10/2014  . Vitamin D deficiency 06/14/2010  . GERD 03/15/2009  . UNSPECIFIED MENOPAUSAL&POSTMENOPAUSAL DISORDER 06/01/2008  . ANKLE EDEMA, CHRONIC 03/15/2008  . LEG CRAMPS 11/18/2006  . HYPERCHOLESTEROLEMIA 10/07/2006  . OBESITY 10/07/2006  . Essential hypertension 10/07/2006  . DIVERTICULOSIS, COLON 10/07/2006  . CONSTIPATION 10/07/2006    Past Surgical  History:  Procedure Laterality Date  . CATARACT EXTRACTION     Bil  . COLONOSCOPY    . DILATION AND CURETTAGE OF UTERUS     1 miscarriage  . DILATION AND CURETTAGE OF UTERUS  01/2008   vaginal bleeding (Dr. Ronita Hipps)  . OOPHORECTOMY  1965   right due to cysts  . OOPHORECTOMY  1971   ovarian cystectomy left  . POLYPECTOMY    . VAGINAL DELIVERY     NSVD x 5    OB History   No obstetric history on file.      Home Medications    Prior to Admission medications   Medication Sig Start Date End Date Taking? Authorizing Provider  amLODipine (NORVASC) 10 MG tablet Take 1 tablet (10 mg total) by mouth daily. 12/14/18   Tonia Ghent, MD  celecoxib (CELEBREX) 200 MG capsule Take 1 capsule (200 mg total) by mouth daily. 12/21/18   Tonia Ghent, MD  cetirizine (ZYRTEC) 10 MG tablet Take 1 tablet (10 mg total) by mouth daily. 10/26/18   Bast, Tressia Miners A, NP  fluticasone (FLONASE) 50 MCG/ACT nasal spray Place 1 spray into both nostrils daily. 12/14/18   Tonia Ghent, MD  meclizine (ANTIVERT) 25 MG tablet Take 0.5-1 tablets (12.5-25 mg total) by mouth 3 (three) times daily as needed for dizziness (sedation caution). 10/08/18   Tonia Ghent, MD  metoprolol succinate (TOPROL-XL) 50 MG 24 hr tablet Take 3 tablets (150 mg total) by mouth daily. 12/11/18   Tonia Ghent, MD  omeprazole (PRILOSEC) 40 MG capsule TAKE 1 CAPSULE BY MOUTH TWICE A DAY AS NEEDED. 12/21/18   Tonia Ghent, MD  Vitamin D, Ergocalciferol, (DRISDOL) 1.25 MG (50000 UT) CAPS capsule Take 1 capsule (50,000 Units total) by mouth 2 (two) times a week. 04/01/19   Tonia Ghent, MD    Family History Family History  Problem Relation Age of Onset  . Hypertension Mother   . Heart disease Father        MI  . Hypertension Father   . Ulcers Brother        PUD  . Alcohol abuse Sister   . Alcohol abuse Sister   . Colon cancer Son   . Heart disease Brother   . Heart attack Brother   . Breast cancer Neg Hx   .  Esophageal cancer Neg Hx   . Rectal cancer Neg Hx   . Stomach cancer Neg Hx     Social History Social History   Tobacco Use  . Smoking status: Former Smoker    Types: Cigarettes    Quit date: 07/16/1973    Years since quitting: 45.8  . Smokeless tobacco: Never Used  . Tobacco comment: quit over 40 years ago  Substance Use Topics  . Alcohol use: No  . Drug use: No     Allergies   Aspirin, Hctz [hydrochlorothiazide], and Lisinopril   Review of Systems Review of Systems  Reason unable to perform ROS: See HPI as above.     Physical Exam Triage Vital Signs ED Triage Vitals  Enc Vitals Group     BP 05/25/19 1630 (!) 174/111     Pulse Rate 05/25/19 1630 (!) 116     Resp 05/25/19 1630 18     Temp 05/25/19 1630 (!) 102.3 F (39.1 C)     Temp Source 05/25/19 1630 Oral     SpO2 05/25/19 1630 96 %     Weight --      Height --      Head Circumference --      Peak Flow --      Pain Score 05/25/19 1631 5     Pain Loc --      Pain Edu? --      Excl. in [place]? --    No data found.  Updated Vital Signs BP (!) 174/111 (BP Location: Left Arm)   Pulse (!) 120   Temp (!) 102.2 F (39 C) (Oral) Comment: pt states will take motrin when she gets home.   Resp 18   SpO2 96%   Physical Exam Constitutional:      General: She is not in acute distress.    Appearance: Normal appearance. She is not ill-appearing, toxic-appearing or diaphoretic.  HENT:     Head: Normocephalic and atraumatic.     Right Ear: Tympanic membrane, ear canal and external ear normal. Tympanic membrane is not erythematous or bulging.     Left Ear: Tympanic membrane, ear canal and external ear normal. Tympanic membrane is not erythematous or bulging.     Mouth/Throat:     Mouth: Mucous membranes are moist.     Pharynx: Oropharynx is clear. Uvula midline.  Cardiovascular:     Rate and Rhythm: Regular rhythm. Tachycardia present.     Heart sounds: Normal heart sounds. No murmur. No friction rub. No gallop.  Pulmonary:     Effort: Pulmonary effort is normal. No accessory muscle usage, prolonged expiration, respiratory distress or retractions.     Comments: Lungs clear to auscultation without adventitious lung sounds. Musculoskeletal:     Cervical back: Normal range of motion and neck supple.  Neurological:     General: No focal deficit present.     Mental Status: She is alert and oriented to person, place, and time.     UC Treatments / Results  Labs (all labs ordered are listed, but only abnormal results are displayed) Labs Reviewed  POC SARS CORONAVIRUS 2 AG -  ED - Abnormal; Notable for the following components:      Result Value   SARS Coronavirus 2 Ag Positive (*)    All other components within normal limits  POCT INFLUENZA A/B - Normal    EKG   Radiology No results found.  Procedures Procedures (including critical care time)  Medications Ordered in UC Medications  acetaminophen (TYLENOL) tablet 975 mg (975 mg Oral Given 05/25/19 1632)    Initial Impression / Assessment and Plan / UC Course  I have reviewed the triage vital signs and the nursing notes.  Pertinent labs & imaging results that were available during my care of the patient were reviewed by me and considered in my medical decision making (see chart for details).    Patient tachycardic at 116, febrile at 102.3.  Suspect tachycardia due to febrile illness.  She is without chest pain, palpitation, shortness of breath, weakness, dizziness, syncope.  Lungs clear to auscultation bilaterally without adventitious lung sounds.  Given current symptom onset 2 days, will obtain rapid Covid, rapid flu for further evaluation.  Rapid Covid positive.  Infusion clinic called.  Patient with tachycardia on exam, but also febrile, will have patient continue to monitor at this time.  Push fluids.  Otherwise symptomatic treatment discussed, quarantine instructions provided.  Return precautions given.  Patient expresses understanding  and agrees to plan.  Final Clinical Impressions(s) / UC Diagnoses   Final diagnoses:  COVID-19  Fever, unspecified   ED Prescriptions    None     PDMP not reviewed this encounter.   Ok Edwards, PA-C 05/25/19 1931

## 2019-05-25 NOTE — Telephone Encounter (Signed)
Noted.  Please get update on patient tomorrow or Thursday, when possible.  Thanks.

## 2019-05-25 NOTE — ED Triage Notes (Signed)
Pt states had body aches last Thursday and had a neg COVID test. States still body aches and yesterday developed a cough and fever.

## 2019-05-25 NOTE — Discharge Instructions (Addendum)
Rapid COVID positive. Please quarantine at this time. I have called the infusion clinic, they will give you a call for possible schedules to treat COVID with infusion medications. I have attached some information about this for you. Otherwise, please continue tylenol/motrin for fever. You can discontinue quarantine after 10 days of symptom onset AND 24 hours of no fever with improvement of symptoms. If develop shortness of breath, chest pain, trouble breathing, weakness, dizziness, go to the emergency department for further evaluation needed.

## 2019-05-25 NOTE — Telephone Encounter (Signed)
Pt received neg covid lab report on 05/24/19. Pt has prod cough with white phlegm starting on 05/24/19.T 98.1 but pt does have chills and body aches. Pt feels slight weakness. No CP,dizziness,H/A or SOB. Pt has tightness in middle of chest on and off for 1 wk. Pt does have tightness in chest when coughs now. Pt does have S/T and hoarseness. Pt had wheezing in chest last night. Pt has hx of bronchitis and had walking pneumonia x 2 years ago. Pt said she does not know the name of the UC that is one mi from where pt lives but she will go to that UC for eval and possible CXR or flu test. FYI to Dr Damita Dunnings.

## 2019-05-25 NOTE — Telephone Encounter (Signed)
Received referral for patient to receive monoclonal antibody treatment for COVID 19 from urgent care.  Date Tested: 05/25/2019  Date of Symptom onset: 05/24/2019   Symptoms: fever, cough  Risk: age, HTN  I spoke with Cassandra Cooke and her friend Cassandra Cooke who was also on the call about her being at increased risk for 0000000 complications.  I reviewed that treatment with Bamlanivimab has been approved under FDA emergency use.  I discussed that it works to bind to the virus in the body to prevent the virus from infecting more cells within her body.  We discussed common side effects that include nausea, vomiting, diarrhea, dizziness, headache and itchiness.  I reviewed with her that the purpose of her receiving this treatment is to help prevent hospitalization/hospital visits/severe COVID.    Cassandra Cooke received information about this treatment in her AVS, and remains concerned about what the right decision for her is.  She prefers to discuss this further with her PCP.  I let her know that I would send this note to Dr. Damita Dunnings, so that she can follow up with him tomorrow and get further guidance.    Cassandra Cooke will call us back if she decides to receive treatment.  I reviewed with her that receiving this infusion is time sensitive, and asked that she please f/u with [place] tomorrow if she wants to receive the treatment.    I answered all of patient's questions and all concerns addressed.  Cassandra Cooke and her friend Cassandra Cooke voiced understanding and appreciation of our discussion.     Wilber Bihari, NP

## 2019-05-26 ENCOUNTER — Telehealth: Payer: Self-pay | Admitting: *Deleted

## 2019-05-26 ENCOUNTER — Telehealth: Payer: Self-pay | Admitting: Nurse Practitioner

## 2019-05-26 ENCOUNTER — Telehealth: Payer: Self-pay | Admitting: Pulmonary Disease

## 2019-05-26 ENCOUNTER — Other Ambulatory Visit: Payer: Self-pay | Admitting: Nurse Practitioner

## 2019-05-26 DIAGNOSIS — I10 Essential (primary) hypertension: Secondary | ICD-10-CM

## 2019-05-26 DIAGNOSIS — U071 COVID-19: Secondary | ICD-10-CM

## 2019-05-26 NOTE — Telephone Encounter (Signed)
See follow-up phone note. 

## 2019-05-26 NOTE — Telephone Encounter (Signed)
05/26/2019    I attempted to reach the patient regarding recent Covid diagnosis as well as opportunity for monoclonal antibody infusion.  It does look like the patient would qualify based off of Age.  I have left a message with the telephone number: [phone] for the patient to call us back if they are interested.  I will also send a MyChart message detailing this as well.   Will route to PCP as FYI.  Lauraine Rinne, NP

## 2019-05-26 NOTE — Progress Notes (Signed)
  I connected by phone with Cassandra Cooke on 05/26/2019 at 7:31 PM to discuss the potential use of an new treatment for mild to moderate COVID-19 viral infection in non-hospitalized patients.  This patient is a 79 y.o. female that meets the FDA criteria for Emergency Use Authorization of bamlanivimab or casirivimab\imdevimab.  Has a (+) direct SARS-CoV-2 viral test result  Has mild or moderate COVID-19   Is ? 79 years of age and weighs ? 40 kg  Is NOT hospitalized due to COVID-19  Is NOT requiring oxygen therapy or requiring an increase in baseline oxygen flow rate due to COVID-19  Is within 10 days of symptom onset  Has at least one of the high risk factor(s) for progression to severe COVID-19 and/or hospitalization as defined in EUA.  Specific high risk criteria : >/= 77 yo and essential hypertension.   I have spoken and communicated the following to the patient or parent/caregiver:  1. FDA has authorized the emergency use of bamlanivimab and casirivimab\imdevimab for the treatment of mild to moderate COVID-19 in adults and pediatric patients with positive results of direct SARS-CoV-2 viral testing who are 3 years of age and older weighing at least 40 kg, and who are at high risk for progressing to severe COVID-19 and/or hospitalization.  2. The significant known and potential risks and benefits of bamlanivimab and casirivimab\imdevimab, and the extent to which such potential risks and benefits are unknown.  3. Information on available alternative treatments and the risks and benefits of those alternatives, including clinical trials.  4. Patients treated with bamlanivimab and casirivimab\imdevimab should continue to self-isolate and use infection control measures (e.g., wear mask, isolate, social distance, avoid sharing personal items, clean and disinfect "high touch" surfaces, and frequent handwashing) according to CDC guidelines.   5. The patient or parent/caregiver has the  option to accept or refuse bamlanivimab or casirivimab\imdevimab .  After reviewing this information with the patient, The patient agreed to proceed with receiving the bamlanimivab infusion and will be provided a copy of the Fact sheet prior to receiving the infusion.   [name] 05/26/2019 7:31 PM

## 2019-05-26 NOTE — Telephone Encounter (Signed)
Please call patient.  I do not see a contraindication for the infusion.  I think it makes sense to proceed as this may decrease her chance of having severe problems from Covid.  Thanks.  I appreciate the help of all involved.

## 2019-05-26 NOTE — Telephone Encounter (Signed)
Patient notified as instructed by telephone and verbalized understanding. 

## 2019-05-26 NOTE — Telephone Encounter (Signed)
Patient left a voicemail stating that she tested positive for coivd yesterday. Patient stated that she got a call today about a treatment by injection for the virus. Patient stated that she needs to let them know today if she wants the treatment or not. Patient wants to know if Dr. Damita Dunnings thinks that she should take the treatment or not?

## 2019-05-26 NOTE — Telephone Encounter (Signed)
Called to discuss with Cassandra Cooke about Covid symptoms and the use of bamlanivimab, a monoclonal antibody infusion for those with mild to moderate Covid symptoms and at a high risk of hospitalization.     Pt is qualified for this infusion at the [facility] infusion center due to co-morbid conditions and/or a member of an at-risk group. Patient verbalized understanding of infusion and is requesting to be scheduled.   Cassandra Cooke has been scheduled for Tuesday 06/01/19 at 1030. Detailed instructions on appointment details including address and phone number in case of any needed changes.    Patient Active Problem List   Diagnosis Date Noted  . Shoulder pain 12/30/2018  . Vertigo 10/11/2018  . Hyperglycemia 06/03/2018  . Vaginal dryness 06/03/2018  . Foot pain 09/01/2017  . Healthcare maintenance 08/09/2016  . Pain in knee joint 01/13/2015  . Osteopenia 01/16/2014  . Medicare annual wellness visit, subsequent 01/10/2014  . Advance care planning 01/10/2014  . Vitamin D deficiency 06/14/2010  . GERD 03/15/2009  . UNSPECIFIED MENOPAUSAL&POSTMENOPAUSAL DISORDER 06/01/2008  . ANKLE EDEMA, CHRONIC 03/15/2008  . LEG CRAMPS 11/18/2006  . HYPERCHOLESTEROLEMIA 10/07/2006  . OBESITY 10/07/2006  . Essential hypertension 10/07/2006  . DIVERTICULOSIS, COLON 10/07/2006  . CONSTIPATION 10/07/2006    Cassandra Cooke, AGPCNP-BC [phone]

## 2019-05-27 NOTE — Telephone Encounter (Signed)
Duly noted.  I hope she feels better soon.  I think it makes sense to continue as planned for now with supportive care and have her update [place] as needed in the meantime, especially if she is getting worse.  Thanks.

## 2019-05-27 NOTE — Telephone Encounter (Signed)
Dr. Damita Dunnings,   I spoke with pt and she states she is still experiencing a cough and body aches and fever. She has been taking tylenol to break it. Denies SOB. Yesterday the highest was 102. She states you are aware that she tested positive and she goes for her infusion next week.

## 2019-05-27 NOTE — Telephone Encounter (Signed)
Left VM requesting pt to call the office back 

## 2019-05-28 NOTE — Telephone Encounter (Signed)
I get her point.  I'll defer to her but I wouldn't have her cancel it yet.  I would have her give it a little more time before she makes a final decision.  Please update [place] as needed.  Thanks.

## 2019-05-28 NOTE — Telephone Encounter (Signed)
Patient notified as instructed by telephone and verbalized understanding. Patient stated that she is scheduled for the infusion Tuesday at 10:15 am, but is torn about having it done. Patient stated that she has not had a fever in two days. Patient stated that she still has a cough and is weak. ER precautions given and she verbalized understanding.

## 2019-05-28 NOTE — Telephone Encounter (Signed)
Patient notified as instructed by telephone and verbalized understanding. 

## 2019-06-01 ENCOUNTER — Ambulatory Visit (HOSPITAL_COMMUNITY)
Admission: RE | Admit: 2019-06-01 | Discharge: 2019-06-01 | Disposition: A | Discharge status: Home / Self Care | Payer: Medicare Other | Source: Ambulatory Visit | Attending: Pulmonary Disease | Admitting: Pulmonary Disease

## 2019-06-01 DIAGNOSIS — Z23 Encounter for immunization: Secondary | ICD-10-CM | POA: Insufficient documentation

## 2019-06-01 DIAGNOSIS — U071 COVID-19: Secondary | ICD-10-CM | POA: Insufficient documentation

## 2019-06-01 DIAGNOSIS — I10 Essential (primary) hypertension: Secondary | ICD-10-CM | POA: Insufficient documentation

## 2019-06-01 MED ORDER — SODIUM CHLORIDE 0.9 % IV SOLN
700.0000 mg | Freq: Once | INTRAVENOUS | Status: AC
  Administered 2019-06-01: 11:00:00 700 mg via INTRAVENOUS
  Filled 2019-06-01: qty 20

## 2019-06-01 MED ORDER — ALBUTEROL SULFATE HFA 108 (90 BASE) MCG/ACT IN AERS: 2.0000 | INHALATION_SPRAY | Freq: Once | RESPIRATORY_TRACT | Status: DC | PRN

## 2019-06-01 MED ORDER — EPINEPHRINE 0.3 MG/0.3ML IJ SOAJ: 0.3000 mg | Freq: Once | INTRAMUSCULAR | Status: DC | PRN

## 2019-06-01 MED ORDER — SODIUM CHLORIDE 0.9 % IV SOLN: INTRAVENOUS | Status: DC | PRN

## 2019-06-01 MED ORDER — FAMOTIDINE IN NACL 20-0.9 MG/50ML-% IV SOLN: 20.0000 mg | Freq: Once | INTRAVENOUS | Status: DC | PRN

## 2019-06-01 MED ORDER — DIPHENHYDRAMINE HCL 50 MG/ML IJ SOLN: 50.0000 mg | Freq: Once | INTRAMUSCULAR | Status: DC | PRN

## 2019-06-01 MED ORDER — METHYLPREDNISOLONE SODIUM SUCC 125 MG IJ SOLR: 125.0000 mg | Freq: Once | INTRAMUSCULAR | Status: DC | PRN

## 2019-06-01 NOTE — Progress Notes (Signed)
  Diagnosis: COVID-83  Physician:Wright MD  Procedure: [facility]id Infusion Clinic Med: bamlanivimab infusion - Provided patient with bamlanimivab fact sheet for patients, parents and caregivers prior to infusion.  Complications: No immediate complications noted.  Discharge: Discharged home   [address] 06/01/2019

## 2019-06-01 NOTE — Discharge Instructions (Signed)

## 2019-06-03 ENCOUNTER — Telehealth: Payer: Self-pay

## 2019-06-03 NOTE — Telephone Encounter (Signed)
Pt said had dx + covid on 05/25/19.pt got infusion 240 at [facility] for covid on 06/01/19. Pt was advised to call PCP after getting infusion if questions or concerns. Pt has chills, no fever, both legs hurt for awhile but is worse now. Pt has dizziness with room spinning on and off when moves; dizziness started on 06/02/19. Few mins ago pts right arm feels numb; same arm that got tranfusion in hand. Pt has generalized weakness and fatigue, pt has prod cough with white phlegm, food not tasting right but pt is eating. Pt has dry mouth on and off. Pt has scratchy throat. Pt does feel worse since having the transfusion. Pt has to have a virtual appt prior to going to respiratory clinic. Pt declines 911 and pt should not drive. Pt will have neighbor drive her to [facility] at [facility]. No h/a, fever, SOB, rash, abd pain, diarrhea, vomiting and no bleeding. FYI to Dr Damita Dunnings.

## 2019-06-04 NOTE — Telephone Encounter (Signed)
Please get update on patient

## 2019-06-04 NOTE — Telephone Encounter (Signed)
No answer, VM full.  Will try again later.

## 2019-06-04 NOTE — Telephone Encounter (Signed)
Patient is feeling much better today, doing some cleaning.

## 2019-06-06 NOTE — Telephone Encounter (Signed)
Noted.  Glad to hear.  Thanks. 

## 2019-06-17 ENCOUNTER — Encounter: Payer: Medicare Other | Admitting: Family Medicine

## 2019-06-24 ENCOUNTER — Ambulatory Visit: Payer: Medicare PPO | Admitting: Family Medicine

## 2019-06-24 ENCOUNTER — Other Ambulatory Visit: Payer: Self-pay

## 2019-06-24 DIAGNOSIS — Z0000 Encounter for general adult medical examination without abnormal findings: Secondary | ICD-10-CM

## 2019-06-24 NOTE — Assessment & Plan Note (Signed)
We will get her rescheduled.

## 2019-06-24 NOTE — Progress Notes (Signed)
  No answer to Doxy (or phone call x2.)  Left message on voicemail that we can get her rescheduled.

## 2019-06-25 ENCOUNTER — Telehealth: Payer: Self-pay

## 2019-06-25 NOTE — Telephone Encounter (Signed)
-----   Message from Tonia Ghent, MD sent at 06/24/2019 10:57 AM EST ----- Please try to reschedule patient.    I tried to call her on Thursday but could not get in touch with her.  I left her a voicemail.  Please reschedule a Medicare wellness visit when possible.  Thanks.

## 2019-06-28 NOTE — Telephone Encounter (Signed)
Called and got patient rescheduled

## 2019-06-29 ENCOUNTER — Other Ambulatory Visit (INDEPENDENT_AMBULATORY_CARE_PROVIDER_SITE_OTHER): Payer: Medicare PPO

## 2019-06-29 ENCOUNTER — Ambulatory Visit (INDEPENDENT_AMBULATORY_CARE_PROVIDER_SITE_OTHER): Payer: Medicare PPO

## 2019-06-29 ENCOUNTER — Other Ambulatory Visit: Payer: Self-pay | Admitting: Family Medicine

## 2019-06-29 ENCOUNTER — Other Ambulatory Visit: Payer: Self-pay

## 2019-06-29 DIAGNOSIS — E559 Vitamin D deficiency, unspecified: Secondary | ICD-10-CM | POA: Diagnosis not present

## 2019-06-29 DIAGNOSIS — I10 Essential (primary) hypertension: Secondary | ICD-10-CM

## 2019-06-29 DIAGNOSIS — R739 Hyperglycemia, unspecified: Secondary | ICD-10-CM

## 2019-06-29 DIAGNOSIS — Z0000 Encounter for general adult medical examination without abnormal findings: Secondary | ICD-10-CM

## 2019-06-29 LAB — COMPREHENSIVE METABOLIC PANEL
ALT: 22 U/L (ref 0–35)
AST: 21 U/L (ref 0–37)
Albumin: 4.3 g/dL (ref 3.5–5.2)
Alkaline Phosphatase: 83 U/L (ref 39–117)
BUN: 12 mg/dL (ref 6–23)
CO2: 28 mEq/L (ref 19–32)
Calcium: 9.9 mg/dL (ref 8.4–10.5)
Chloride: 105 mEq/L (ref 96–112)
Creatinine, Ser: 0.67 mg/dL (ref 0.40–1.20)
GFR: 103.31 mL/min (ref >60.00)
Glucose, Bld: 86 mg/dL (ref 70–99)
Potassium: 4 mEq/L (ref 3.5–5.1)
Sodium: 140 mEq/L (ref 135–145)
Total Bilirubin: 0.3 mg/dL (ref 0.2–1.2)
Total Protein: 7.7 g/dL (ref 6.0–8.3)

## 2019-06-29 LAB — LIPID PANEL
Cholesterol: 194 mg/dL (ref 0–200)
HDL: 45.1 mg/dL (ref >39.00)
LDL Cholesterol: 122 mg/dL — ABNORMAL HIGH (ref 0–99)
NonHDL: 148.55
Total CHOL/HDL Ratio: 4
Triglycerides: 135 mg/dL (ref 0.0–149.0)
VLDL: 27 mg/dL (ref 0.0–40.0)

## 2019-06-29 LAB — VITAMIN D 25 HYDROXY (VIT D DEFICIENCY, FRACTURES): VITD: 35.25 ng/mL (ref 30.00–100.00)

## 2019-06-29 NOTE — Progress Notes (Signed)
PCP notes:  Health Maintenance: Flu vaccine- due, but patient wants to discuss getting the COVID vaccine  Mammogram- declined due to COVID   Abnormal Screenings: none   Patient concerns: none   Nurse concerns: None   Next PCP appt.: 07/02/2019 @ 10:30 am

## 2019-06-29 NOTE — Patient Instructions (Signed)
[name] , Thank you for taking time to come for your Medicare Wellness Visit. I appreciate your ongoing commitment to your health goals. Please review the following plan we discussed and let me know if I can assist you in the future.   Screening recommendations/referrals: Colonoscopy: Up to date, completed 01/21/2018 Mammogram: declined due to COVID Bone Density: completed 01/11/2014 Recommended yearly ophthalmology/optometry visit for glaucoma screening and checkup Recommended yearly dental visit for hygiene and checkup  Vaccinations: Influenza vaccine: postponed to due wanting COVID vaccine Pneumococcal vaccine: Completed series Tdap vaccine: Up to date, completed 01/25/2013 Shingles vaccine: discussed    Advanced directives: Advance directive discussed with you today. Even though you declined this today please call our office should you change your mind and we can give you the proper paperwork for you to fill out.  Conditions/risks identified: hypertension  Next appointment: 07/02/2019 @ 10:30 am    Preventive Care 65 Years and Older, Female Preventive care refers to lifestyle choices and visits with your health care provider that can promote health and wellness. What does preventive care include?  A yearly physical exam. This is also called an annual well check.  Dental exams once or twice a year.  Routine eye exams. Ask your health care provider how often you should have your eyes checked.  Personal lifestyle choices, including:  Daily care of your teeth and gums.  Regular physical activity.  Eating a healthy diet.  Avoiding tobacco and drug use.  Limiting alcohol use.  Practicing safe sex.  Taking low-dose aspirin every day.  Taking vitamin and mineral supplements as recommended by your health care provider. What happens during an annual well check? The services and screenings done by your health care provider during your annual well check will depend on your  age, overall health, lifestyle risk factors, and family history of disease. Counseling  Your health care provider may ask you questions about your:  Alcohol use.  Tobacco use.  Drug use.  Emotional well-being.  Home and relationship well-being.  Sexual activity.  Eating habits.  History of falls.  Memory and ability to understand (cognition).  Work and work Statistician.  Reproductive health. Screening  You may have the following tests or measurements:  Height, weight, and BMI.  Blood pressure.  Lipid and cholesterol levels. These may be checked every 5 years, or more frequently if you are over 53 years old.  Skin check.  Lung cancer screening. You may have this screening every year starting at age 28 if you have a 30-pack-year history of smoking and currently smoke or have quit within the past 15 years.  Fecal occult blood test (FOBT) of the stool. You may have this test every year starting at age 74.  Flexible sigmoidoscopy or colonoscopy. You may have a sigmoidoscopy every 5 years or a colonoscopy every 10 years starting at age 43.  Hepatitis C blood test.  Hepatitis B blood test.  Sexually transmitted disease (STD) testing.  Diabetes screening. This is done by checking your blood sugar (glucose) after you have not eaten for a while (fasting). You may have this done every 1-3 years.  Bone density scan. This is done to screen for osteoporosis. You may have this done starting at age 76.  Mammogram. This may be done every 1-2 years. Talk to your health care provider about how often you should have regular mammograms. Talk with your health care provider about your test results, treatment options, and if necessary, the need for more tests.  Vaccines  Your health care provider may recommend certain vaccines, such as:  Influenza vaccine. This is recommended every year.  Tetanus, diphtheria, and acellular pertussis (Tdap, Td) vaccine. You may need a Td booster  every 10 years.  Zoster vaccine. You may need this after age 45.  Pneumococcal 13-valent conjugate (PCV13) vaccine. One dose is recommended after age 84.  Pneumococcal polysaccharide (PPSV23) vaccine. One dose is recommended after age 36. Talk to your health care provider about which screenings and vaccines you need and how often you need them. This information is not intended to replace advice given to you by your health care provider. Make sure you discuss any questions you have with your health care provider. Document Released: 05/19/2015 Document Revised: 01/10/2016 Document Reviewed: 02/21/2015 Elsevier Interactive Patient Education  2017 [place] Prevention in the Home Falls can cause injuries. They can happen to people of all ages. There are many things you can do to make your home safe and to help prevent falls. What can I do on the outside of my home?  Regularly fix the edges of walkways and driveways and fix any cracks.  Remove anything that might make you trip as you walk through a door, such as a raised step or threshold.  Trim any bushes or trees on the path to your home.  Use bright outdoor lighting.  Clear any walking paths of anything that might make someone trip, such as rocks or tools.  Regularly check to see if handrails are loose or broken. Make sure that both sides of any steps have handrails.  Any raised decks and porches should have guardrails on the edges.  Have any leaves, snow, or ice cleared regularly.  Use sand or salt on walking paths during winter.  Clean up any spills in your garage right away. This includes oil or grease spills. What can I do in the bathroom?  Use night lights.  Install grab bars by the toilet and in the tub and shower. Do not use towel bars as grab bars.  Use non-skid mats or decals in the tub or shower.  If you need to sit down in the shower, use a plastic, non-slip stool.  Keep the floor dry. Clean up any  water that spills on the floor as soon as it happens.  Remove soap buildup in the tub or shower regularly.  Attach bath mats securely with double-sided non-slip rug tape.  Do not have throw rugs and other things on the floor that can make you trip. What can I do in the bedroom?  Use night lights.  Make sure that you have a light by your bed that is easy to reach.  Do not use any sheets or blankets that are too big for your bed. They should not hang down onto the floor.  Have a firm chair that has side arms. You can use this for support while you get dressed.  Do not have throw rugs and other things on the floor that can make you trip. What can I do in the kitchen?  Clean up any spills right away.  Avoid walking on wet floors.  Keep items that you use a lot in easy-to-reach places.  If you need to reach something above you, use a strong step stool that has a grab bar.  Keep electrical cords out of the way.  Do not use floor polish or wax that makes floors slippery. If you must use wax, use non-skid floor wax.  Do not have throw rugs and other things on the floor that can make you trip. What can I do with my stairs?  Do not leave any items on the stairs.  Make sure that there are handrails on both sides of the stairs and use them. Fix handrails that are broken or loose. Make sure that handrails are as long as the stairways.  Check any carpeting to make sure that it is firmly attached to the stairs. Fix any carpet that is loose or worn.  Avoid having throw rugs at the top or bottom of the stairs. If you do have throw rugs, attach them to the floor with carpet tape.  Make sure that you have a light switch at the top of the stairs and the bottom of the stairs. If you do not have them, ask someone to add them for you. What else can I do to help prevent falls?  Wear shoes that:  Do not have high heels.  Have rubber bottoms.  Are comfortable and fit you well.  Are closed  at the toe. Do not wear sandals.  If you use a stepladder:  Make sure that it is fully opened. Do not climb a closed stepladder.  Make sure that both sides of the stepladder are locked into place.  Ask someone to hold it for you, if possible.  Clearly mark and make sure that you can see:  Any grab bars or handrails.  First and last steps.  Where the edge of each step is.  Use tools that help you move around (mobility aids) if they are needed. These include:  Canes.  Walkers.  Scooters.  Crutches.  Turn on the lights when you go into a dark area. Replace any light bulbs as soon as they burn out.  Set up your furniture so you have a clear path. Avoid moving your furniture around.  If any of your floors are uneven, fix them.  If there are any pets around you, be aware of where they are.  Review your medicines with your doctor. Some medicines can make you feel dizzy. This can increase your chance of falling. Ask your doctor what other things that you can do to help prevent falls. This information is not intended to replace advice given to you by your health care provider. Make sure you discuss any questions you have with your health care provider. Document Released: 02/16/2009 Document Revised: 09/28/2015 Document Reviewed: 05/27/2014 Elsevier Interactive Patient Education  2017 [employer].

## 2019-06-29 NOTE — Progress Notes (Signed)
Subjective:   Cassandra Cooke is a 79 y.o. female who presents for Medicare Annual (Subsequent) preventive examination.  Review of Systems: N/A   This visit is being conducted through telemedicine via telephone at the nurse health advisor's home address due to the COVID-19 pandemic. This patient has given me verbal consent via doximity to conduct this visit, patient states they are participating from their home address. Patient and myself are on the telephone call. There is no referral for this visit. Some vital signs may be absent or patient reported.    Patient identification: identified by name, DOB, and current address   Cardiac Risk Factors include: advanced age (>85men, >53 women);hypertension     Objective:     Vitals: There were no vitals taken for this visit.  There is no height or weight on file to calculate BMI.  Advanced Directives 06/29/2019 10/02/2018 07/29/2016  Does Patient Have a Medical Advance Directive? No No No  Would patient like information on creating a medical advance directive? No - Patient declined No - Patient declined -    Tobacco Social History   Tobacco Use  Smoking Status Former Smoker  . Types: Cigarettes  . Quit date: 07/16/1973  . Years since quitting: 45.9  Smokeless Tobacco Never Used  Tobacco Comment   quit over 40 years ago     Counseling given: Not Answered Comment: quit over 40 years ago   Clinical Intake:  Pre-visit preparation completed: Yes  Pain : No/denies pain     Nutritional Risks: None Diabetes: No  How often do you need to have someone help you when you read instructions, pamphlets, or other written materials from your doctor or pharmacy?: 1 - Never What is the last grade level you completed in school?: 12th  Interpreter Needed?: No  Information entered by :: CJohnson, LPN  Past Medical History:  Diagnosis Date  . Allergy   . Anxiety    during a period of social upheaval  . Arthritis   . Bloating   .  Cataract   . Colon polyps   . Diabetes mellitus type II    Borderline  . Diverticulosis of colon   . GERD (gastroesophageal reflux disease)   . Heartburn   . Hiatal hernia   . Hypertension   . Vitamin D deficiency    Past Surgical History:  Procedure Laterality Date  . CATARACT EXTRACTION     Bil  . COLONOSCOPY    . DILATION AND CURETTAGE OF UTERUS     1 miscarriage  . DILATION AND CURETTAGE OF UTERUS  01/2008   vaginal bleeding (Dr. Ronita Hipps)  . OOPHORECTOMY  1965   right due to cysts  . OOPHORECTOMY  1971   ovarian cystectomy left  . POLYPECTOMY    . VAGINAL DELIVERY     NSVD x 5   Family History  Problem Relation Age of Onset  . Hypertension Mother   . Heart disease Father        MI  . Hypertension Father   . Ulcers Brother        PUD  . Alcohol abuse Sister   . Alcohol abuse Sister   . Colon cancer Son   . Heart disease Brother   . Heart attack Brother   . Breast cancer Neg Hx   . Esophageal cancer Neg Hx   . Rectal cancer Neg Hx   . Stomach cancer Neg Hx    Social History   Socioeconomic History  .  Marital status: Widowed    Spouse name: Not on file  . Number of children: 5  . Years of education: Not on file  . Highest education level: Not on file  Occupational History  . Occupation: Retired    Fish farm manager: RETIRED  Tobacco Use  . Smoking status: Former Smoker    Types: Cigarettes    Quit date: 07/16/1973    Years since quitting: 45.9  . Smokeless tobacco: Never Used  . Tobacco comment: quit over 40 years ago  Substance and Sexual Activity  . Alcohol use: No  . Drug use: No  . Sexual activity: Never  Other Topics Concern  . Not on file  Social History Narrative   1st husband died of cancer.    Widowed 19-Aug-2011, he had prostate cancer.    H/o mult foster children      1 daughter died of renal disease   1 son with h/o colon cancer   2 other sons and 1 daughter healthy   Social Determinants of Health   Financial Resource Strain: Low Risk   .  Difficulty of Paying Living Expenses: Not hard at all  Food Insecurity: No Food Insecurity  . Worried About Charity fundraiser in the Last Year: Never true  . Ran Out of Food in the Last Year: Never true  Transportation Needs: No Transportation Needs  . Lack of Transportation (Medical): No  . Lack of Transportation (Non-Medical): No  Physical Activity: Inactive  . Days of Exercise per Week: 0 days  . Minutes of Exercise per Session: 0 min  Stress: No Stress Concern Present  . Feeling of Stress : Only a little  Social Connections:   . Frequency of Communication with Friends and Family: Not on file  . Frequency of Social Gatherings with Friends and Family: Not on file  . Attends Religious Services: Not on file  . Active Member of Clubs or Organizations: Not on file  . Attends Archivist Meetings: Not on file  . Marital Status: Not on file    Outpatient Encounter Medications as of 06/29/2019  Medication Sig  . amLODipine (NORVASC) 10 MG tablet Take 1 tablet (10 mg total) by mouth daily.  . celecoxib (CELEBREX) 200 MG capsule Take 1 capsule (200 mg total) by mouth daily.  . cetirizine (ZYRTEC) 10 MG tablet Take 1 tablet (10 mg total) by mouth daily.  . fluticasone (FLONASE) 50 MCG/ACT nasal spray Place 1 spray into both nostrils daily.  . meclizine (ANTIVERT) 25 MG tablet Take 0.5-1 tablets (12.5-25 mg total) by mouth 3 (three) times daily as needed for dizziness (sedation caution).  . metoprolol succinate (TOPROL-XL) 50 MG 24 hr tablet Take 3 tablets (150 mg total) by mouth daily.  Marland Kitchen omeprazole (PRILOSEC) 40 MG capsule TAKE 1 CAPSULE BY MOUTH TWICE A DAY AS NEEDED.  . Vitamin D, Ergocalciferol, (DRISDOL) 1.25 MG (50000 UT) CAPS capsule Take 1 capsule (50,000 Units total) by mouth 2 (two) times a week.   No facility-administered encounter medications on file as of 06/29/2019.    Activities of Daily Living In your present state of health, do you have any difficulty performing  the following activities: 06/29/2019  Hearing? N  Vision? N  Difficulty concentrating or making decisions? N  Walking or climbing stairs? N  Dressing or bathing? N  Doing errands, shopping? N  Preparing Food and eating ? N  Using the Toilet? N  In the past six months, have you accidently leaked urine? N  Do you have problems with loss of bowel control? N  Managing your Medications? N  Managing your Finances? N  Housekeeping or managing your Housekeeping? N  Some recent data might be hidden    Patient Care Team: Tonia Ghent, MD as PCP - General (Family Medicine)    Assessment:   This is a routine wellness examination for [place].  Exercise Activities and Dietary recommendations Current Exercise Habits: The patient does not participate in regular exercise at present, Exercise limited by: None identified  Goals    . Increase physical activity     Starting 07/29/2016, I will continue to exercise for at least 30 min 3-4 days per week.     . Patient Stated     06/29/2019, I will maintain and continue medications as prescribed.       Fall Risk Fall Risk  06/29/2019 09/01/2017 08/29/2017 07/29/2016 07/18/2015  Falls in the past year? 0 No No No No  Comment - - - - -  Number falls in past yr: 0 - - - -  Injury with Fall? 0 - - - -  Comment - - - - -  Risk for fall due to : Impaired balance/gait - - - -  Follow up Falls evaluation completed;Falls prevention discussed - - - -   Is the patient's home free of loose throw rugs in walkways, pet beds, electrical cords, etc?   yes      Grab bars in the bathroom? yes      Handrails on the stairs?   no      Adequate lighting?   yes  Timed Get Up and Go performed: N/A  Depression Screen PHQ 2/9 Scores 06/29/2019 09/01/2017 07/29/2016 07/18/2015  PHQ - 2 Score 0 0 0 0  PHQ- 9 Score 0 - - -     Cognitive Function MMSE - Mini Mental State Exam 06/29/2019 07/29/2016  Orientation to time 5 5  Orientation to Place 5 5  Registration 3 3    Attention/ Calculation 5 0  Recall 3 3  Language- name 2 objects - 0  Language- repeat 1 1  Language- follow 3 step command - 3  Language- read & follow direction - 0  Write a sentence - 0  Copy design - 0  Total score - 20  Mini Cog  Mini-Cog screen was completed. Maximum score is 22. A value of 0 denotes this part of the MMSE was not completed or the patient failed this part of the Mini-Cog screening.       Immunization History  Administered Date(s) Administered  . Influenza Whole 03/06/2005  . Influenza, High Dose Seasonal PF 04/08/2018  . Influenza,inj,Quad PF,6+ Mos 01/07/2014, 01/12/2015  . Influenza-Unspecified 05/20/2016  . Pneumococcal Conjugate-13 08/08/2016  . Pneumococcal Polysaccharide-23 08/29/2017  . Td 03/06/2005, 01/25/2013    Qualifies for Shingles Vaccine: Yes  Screening Tests Health Maintenance  Topic Date Due  . INFLUENZA VACCINE  12/05/2018  . MAMMOGRAM  06/28/2020 (Originally 05/27/2018)  . COLONOSCOPY  01/22/2023  . TETANUS/TDAP  01/26/2023  . DEXA SCAN  Completed  . PNA vac Low Risk Adult  Completed    Cancer Screenings: Lung: Low Dose CT Chest recommended if Age 66-80 years, 30 pack-year currently smoking OR have quit w/in 15 years. Patient does not qualify. Breast:  Up to date on Mammogram? No, declined due to COVID, will do when pandemic gets better   Bone Density/Dexa: completed 01/11/2014 Colorectal: completed 01/21/2018  Additional Screenings:  Hepatitis C  Screening: N/A     Plan:    Patient will maintain and continue medications as prescribed.   I have personally reviewed and noted the following in the patient's chart:   . Medical and social history . Use of alcohol, tobacco or illicit drugs  . Current medications and supplements . Functional ability and status . Nutritional status . Physical activity . Advanced directives . List of other physicians . Hospitalizations, surgeries, and ER visits in previous 12  months . Vitals . Screenings to include cognitive, depression, and falls . Referrals and appointments  In addition, I have reviewed and discussed with patient certain preventive protocols, quality metrics, and best practice recommendations. A written personalized care plan for preventive services as well as general preventive health recommendations were provided to patient.     Andrez Grime, LPN  [id number]

## 2019-07-01 LAB — HEMOGLOBIN A1C: Hgb A1c MFr Bld: 6.2 % (ref 4.6–6.5)

## 2019-07-02 ENCOUNTER — Encounter: Payer: Self-pay | Admitting: Family Medicine

## 2019-07-02 ENCOUNTER — Ambulatory Visit (INDEPENDENT_AMBULATORY_CARE_PROVIDER_SITE_OTHER): Payer: Medicare PPO | Admitting: Family Medicine

## 2019-07-02 ENCOUNTER — Other Ambulatory Visit: Payer: Self-pay

## 2019-07-02 ENCOUNTER — Ambulatory Visit: Payer: Medicare PPO | Admitting: Family Medicine

## 2019-07-02 DIAGNOSIS — E559 Vitamin D deficiency, unspecified: Secondary | ICD-10-CM | POA: Diagnosis not present

## 2019-07-02 DIAGNOSIS — I10 Essential (primary) hypertension: Secondary | ICD-10-CM

## 2019-07-02 DIAGNOSIS — M2550 Pain in unspecified joint: Secondary | ICD-10-CM

## 2019-07-02 DIAGNOSIS — Z8616 Personal history of COVID-19: Secondary | ICD-10-CM

## 2019-07-02 DIAGNOSIS — Z0000 Encounter for general adult medical examination without abnormal findings: Secondary | ICD-10-CM

## 2019-07-02 DIAGNOSIS — Z7189 Other specified counseling: Secondary | ICD-10-CM

## 2019-07-02 MED ORDER — AMLODIPINE BESYLATE 10 MG PO TABS: 10.0000 mg | ORAL_TABLET | Freq: Every day | ORAL | 3 refills | Status: DC

## 2019-07-02 MED ORDER — METOPROLOL SUCCINATE ER 50 MG PO TB24: 150.0000 mg | ORAL_TABLET | Freq: Every day | ORAL | 3 refills | Status: DC

## 2019-07-02 MED ORDER — ALBUTEROL SULFATE HFA 108 (90 BASE) MCG/ACT IN AERS: 1.0000 | INHALATION_SPRAY | Freq: Four times a day (QID) | RESPIRATORY_TRACT | 1 refills | Status: DC | PRN

## 2019-07-02 MED ORDER — VITAMIN D 50 MCG (2000 UT) PO CAPS: 2000.0000 [IU] | ORAL_CAPSULE | Freq: Every day | ORAL | Status: DC

## 2019-07-02 MED ORDER — OMEPRAZOLE 40 MG PO CPDR: DELAYED_RELEASE_CAPSULE | ORAL | 3 refills | Status: DC

## 2019-07-02 NOTE — Patient Instructions (Addendum)
Start 2000 unit OTC vitamin D.  Use the inhaler if needed.  We'll work on mail order meds in the meantime.  Take care.  Glad to see you.

## 2019-07-02 NOTE — Progress Notes (Signed)
This visit occurred during the SARS-CoV-2 public health emergency.  Safety protocols were in place, including screening questions prior to the visit, additional usage of staff PPE, and extensive cleaning of exam room while observing appropriate contact time as indicated for disinfecting solutions.  Flu vaccine- defer until she can get a covid vaccine.  D/w pt.   covid vaccine d/w pt.  She had covid in 05/2019.  Reasonable to get vaccinated 3 months after infusion.   Tetanus 2014 PNA up to date shingrix d/w pt.   Mammogram- declined due to COVID DXA- declined due to [place] directive d/w pt. Daughter [name] and son [name] equally designated if patient were incapacitated.  Diet and exercise d/w pt.   Colonoscopy 2019  Hypertension:    Using medication without problems or lightheadedness: occ lightheaded, not constant.  "I learned not to get up in a hurry."  Chest pain with exertion:no Edema:no Short of breath:no Recheck BP 150/90.    She has had some wheeze and joint aches since covid infection.  Sx are variable since covid infection, some days better than others.      H/o low vit D. Done with prev vit D repletion.   Prn use of celebrex, with relief, used a few times a week.  No adverse effect on medication.  Meds, vitals, and allergies reviewed.   PMH and SH reviewed  ROS: Per HPI unless specifically indicated in ROS section   GEN: nad, alert and oriented HEENT: mucous membranes moist NECK: supple w/o LA CV: rrr. PULM: ctab, no inc wob ABD: soft, +bs EXT: no edema SKIN: no acute rash

## 2019-07-04 ENCOUNTER — Other Ambulatory Visit: Payer: Self-pay | Admitting: Family Medicine

## 2019-07-04 DIAGNOSIS — M2550 Pain in unspecified joint: Secondary | ICD-10-CM | POA: Insufficient documentation

## 2019-07-04 DIAGNOSIS — Z8616 Personal history of COVID-19: Secondary | ICD-10-CM | POA: Insufficient documentation

## 2019-07-04 MED ORDER — OMEPRAZOLE 40 MG PO CPDR: DELAYED_RELEASE_CAPSULE | ORAL | 3 refills | Status: DC

## 2019-07-04 MED ORDER — AMLODIPINE BESYLATE 10 MG PO TABS: 10.0000 mg | ORAL_TABLET | Freq: Every day | ORAL | 3 refills | Status: DC

## 2019-07-04 MED ORDER — METOPROLOL SUCCINATE ER 50 MG PO TB24: 150.0000 mg | ORAL_TABLET | Freq: Every day | ORAL | 3 refills | Status: DC

## 2019-07-04 NOTE — Assessment & Plan Note (Signed)
She has had some wheeze and joint aches since covid infection.  Sx are variable since covid infection, some days better than others.    Reasonable to try albuterol as needed.  Lungs are clear at time of exam.

## 2019-07-04 NOTE — Assessment & Plan Note (Signed)
Advance directive d/w pt. Daughter [name] and son [name] equally designated if patient were incapacitated.

## 2019-07-04 NOTE — Assessment & Plan Note (Signed)
Flu vaccine- defer until she can get a covid vaccine.  D/w pt.   covid vaccine d/w pt.  She had covid in 05/2019.  Reasonable to get vaccinated 3 months after infusion.   Tetanus 2014 PNA up to date shingrix d/w pt.   Mammogram- declined due to COVID DXA- declined due to [place] directive d/w pt. Daughter [name] and son [name] equally designated if patient were incapacitated.  Diet and exercise d/w pt.   Colonoscopy 2019

## 2019-07-04 NOTE — Assessment & Plan Note (Signed)
Recheck BP 150/90.   I do not want to induce transient hypotension.  Would continue as is for now.  Continue work on diet and exercise.  She agrees.

## 2019-07-04 NOTE — Assessment & Plan Note (Signed)
H/o low vit D. Done with prev vit D repletion.  Reasonable to change to 2000 units/day, she can get that over-the-counter.

## 2019-07-04 NOTE — Assessment & Plan Note (Signed)
She uses Celebrex occasionally with relief, not daily.  No adverse effect on medication and she will update me as needed.  Continue as is for now.

## 2019-08-19 ENCOUNTER — Telehealth: Payer: Self-pay | Admitting: Family Medicine

## 2019-08-19 NOTE — Telephone Encounter (Signed)
I would try to get vaccinated at the 91st day or as soon there after as possible.   F/u testing shouldn't be affected by prev Ab tx or vaccination.  Thanks.

## 2019-08-19 NOTE — Telephone Encounter (Signed)
Patient calling for recommendations -- pt had Covid positive test back in Jan (18th) and received the infusion treatment on Jan 24th. She was advised to not get her Covid vaccine for 90 days.  Pt called and scheduled her vaccine and they scheduled her for tomorrow 08/20/19 which is 6 days EARLY, only 84 days from infusion. She was wanting to know if there was any harm in getting the vaccine 6 days early.   I spoke with Mearl Latin and Larene Beach, RN to get clarification of this and they both stated that it best to wait until that 90 day mark or later to get the vaccine so ensure that she no longer has those antibodies.    Pt states that she going on vacation to [place] June 1st and is wanting to have her vaccine completed before she goes, this is not required by the airlines or travel agency but she is wanting to make sure she is protected prior to leaving.   Pt advised to try and get her appt rescheduled for the vaccine past the 90 day mark and if not able to get this rescheduled to call back. Pt aware that I will send this to Dr Damita Dunnings to advise further.   She is required to get Covid tested 3 days prior to leaving -- another question, would her getting the vaccine close to her test date mess with the results of her Covid test? Give a false positive?   Please advise, thanks.

## 2019-08-19 NOTE — Telephone Encounter (Signed)
Patient advised.

## 2019-09-24 ENCOUNTER — Encounter (HOSPITAL_COMMUNITY): Payer: Self-pay

## 2019-09-24 ENCOUNTER — Ambulatory Visit (HOSPITAL_COMMUNITY)
Admission: EM | Admit: 2019-09-24 | Discharge: 2019-09-24 | Disposition: A | Discharge status: Home / Self Care | Payer: Medicare PPO

## 2019-09-24 ENCOUNTER — Other Ambulatory Visit: Payer: Self-pay

## 2019-09-24 DIAGNOSIS — M25561 Pain in right knee: Secondary | ICD-10-CM

## 2019-09-24 NOTE — ED Provider Notes (Signed)
[place]    CSN: [id number] Arrival date & time: 09/24/19  1410      History   Chief Complaint Chief Complaint  Patient presents with  . Leg Pain    HPI Cassandra Cooke is a 79 y.o. female.   Patient is a 79 year old female with a history of joint pain, left knee pain, arthritis, hypertension, type 2 diabetes, and covid 19. Patient complains of new onset right knee pain. Denies injury or trauma. Pain initially became worse over the first few days but has been improving over the last couple of days. She states pain is worse upon arising in the morning and when beginning to ambulate after prolonged periods of rest. Pain alleviated by Tylenol and movement. Denies numbness, tingling, decreased sensation. She has had similar pain in the past in her left knee related to arthritis.   ROS per HPI      Past Medical History:  Diagnosis Date  . Allergy   . Anxiety    during a period of social upheaval  . Arthritis   . Bloating   . Cataract   . Colon polyps   . Diabetes mellitus type II    Borderline  . Diverticulosis of colon   . GERD (gastroesophageal reflux disease)   . Heartburn   . Hiatal hernia   . Hypertension   . Vitamin D deficiency     Patient Active Problem List   Diagnosis Date Noted  . History of COVID-19 07/04/2019  . Joint pain 07/04/2019  . Shoulder pain 12/30/2018  . Vertigo 10/11/2018  . Hyperglycemia 06/03/2018  . Vaginal dryness 06/03/2018  . Foot pain 09/01/2017  . Healthcare maintenance 08/09/2016  . Pain in knee joint 01/13/2015  . Osteopenia 01/16/2014  . Medicare annual wellness visit, subsequent 01/10/2014  . Advance care planning 01/10/2014  . Vitamin D deficiency 06/14/2010  . GERD 03/15/2009  . UNSPECIFIED MENOPAUSAL&POSTMENOPAUSAL DISORDER 06/01/2008  . ANKLE EDEMA, CHRONIC 03/15/2008  . LEG CRAMPS 11/18/2006  . HYPERCHOLESTEROLEMIA 10/07/2006  . OBESITY 10/07/2006  . Essential hypertension 10/07/2006  .  DIVERTICULOSIS, COLON 10/07/2006  . CONSTIPATION 10/07/2006    Past Surgical History:  Procedure Laterality Date  . CATARACT EXTRACTION     Bil  . COLONOSCOPY    . DILATION AND CURETTAGE OF UTERUS     1 miscarriage  . DILATION AND CURETTAGE OF UTERUS  01/2008   vaginal bleeding (Dr. Ronita Hipps)  . OOPHORECTOMY  1965   right due to cysts  . OOPHORECTOMY  1971   ovarian cystectomy left  . POLYPECTOMY    . VAGINAL DELIVERY     NSVD x 5    OB History   No obstetric history on file.      Home Medications    Prior to Admission medications   Medication Sig Start Date End Date Taking? Authorizing Provider  albuterol (VENTOLIN HFA) 108 (90 Base) MCG/ACT inhaler Inhale 1-2 puffs into the lungs every 6 (six) hours as needed for wheezing. 07/02/19  Yes Tonia Ghent, MD  amLODipine (NORVASC) 10 MG tablet Take 1 tablet (10 mg total) by mouth daily. 07/04/19  Yes Tonia Ghent, MD  celecoxib (CELEBREX) 200 MG capsule Take 1 capsule (200 mg total) by mouth daily. 12/21/18  Yes Tonia Ghent, MD  cetirizine (ZYRTEC) 10 MG tablet Take 1 tablet (10 mg total) by mouth daily. 10/26/18  Yes Kristyanna Barcelo A, NP  Cholecalciferol (VITAMIN D) 50 MCG (2000 UT) CAPS Take 1 capsule (2,000  Units total) by mouth daily. 07/02/19  Yes Tonia Ghent, MD  fluticasone [facility]) 50 MCG/ACT nasal spray Place 1 spray into both nostrils daily. 12/14/18  Yes Tonia Ghent, MD  metoprolol succinate (TOPROL-XL) 50 MG 24 hr tablet Take 3 tablets (150 mg total) by mouth daily. 07/04/19  Yes Tonia Ghent, MD  omeprazole (PRILOSEC) 40 MG capsule TAKE 1 CAPSULE BY MOUTH TWICE A DAY AS NEEDED. 07/04/19  Yes Tonia Ghent, MD  meclizine (ANTIVERT) 25 MG tablet Take 0.5-1 tablets (12.5-25 mg total) by mouth 3 (three) times daily as needed for dizziness (sedation caution). 10/08/18   Tonia Ghent, MD    Family History Family History  Problem Relation Age of Onset  . Hypertension Mother   . Heart disease Father         MI  . Hypertension Father   . Ulcers Brother        PUD  . Alcohol abuse Sister   . Alcohol abuse Sister   . Colon cancer Son   . Heart disease Brother   . Heart attack Brother   . Breast cancer Neg Hx   . Esophageal cancer Neg Hx   . Rectal cancer Neg Hx   . Stomach cancer Neg Hx     Social History Social History   Tobacco Use  . Smoking status: Former Smoker    Types: Cigarettes    Quit date: 07/16/1973    Years since quitting: 46.2  . Smokeless tobacco: Never Used  . Tobacco comment: quit over 40 years ago  Substance Use Topics  . Alcohol use: No  . Drug use: No     Allergies   Aspirin, Hctz [hydrochlorothiazide], and Lisinopril   Review of Systems Review of Systems   Physical Exam Triage Vital Signs ED Triage Vitals  Enc Vitals Group     BP 09/24/19 1513 (!) 160/93     Pulse Rate 09/24/19 1513 84     Resp 09/24/19 1513 18     Temp 09/24/19 1513 97.8 F (36.6 C)     Temp Source 09/24/19 1513 Oral     SpO2 09/24/19 1513 98 %     Weight --      Height --      Head Circumference --      Peak Flow --      Pain Score 09/24/19 1509 7     Pain Loc --      Pain Edu? --      Excl. in [place]? --    No data found.  Updated Vital Signs BP (!) 160/93 (BP Location: Right Arm)   Pulse 84   Temp 97.8 F (36.6 C) (Oral)   Resp 18   SpO2 98%   Visual Acuity Right Eye Distance:   Left Eye Distance:   Bilateral Distance:    Right Eye Near:   Left Eye Near:    Bilateral Near:     Physical Exam Vitals and nursing note reviewed.  Constitutional:      General: She is not in acute distress.    Appearance: Normal appearance. She is not ill-appearing, toxic-appearing or diaphoretic.  HENT:     Head: Normocephalic.     Nose: Nose normal.  Eyes:     Conjunctiva/sclera: Conjunctivae normal.  Pulmonary:     Effort: Pulmonary effort is normal.  Musculoskeletal:     Cervical back: Normal range of motion.     Right knee: Swelling present. Decreased  range  of motion. Tenderness present over the medial joint line and lateral joint line.  Skin:    General: Skin is warm and dry.     Findings: No rash.  Neurological:     Mental Status: She is alert.  Psychiatric:        Mood and Affect: Mood normal.      UC Treatments / Results  Labs (all labs ordered are listed, but only abnormal results are displayed) Labs Reviewed - No data to display  EKG   Radiology No results found.  Procedures Procedures (including critical care time)  Medications Ordered in UC Medications - No data to display  Initial Impression / Assessment and Plan / UC Course  I have reviewed the triage vital signs and the nursing notes.  Pertinent labs & imaging results that were available during my care of the patient were reviewed by me and considered in my medical decision making (see chart for details).     Knee pain-most likely arthritis related.  Continue to rest, ice, elevate the knee.  Can take Tylenol for pain as needed. Follow up as needed for continued or worsening symptoms  Final Clinical Impressions(s) / UC Diagnoses   Final diagnoses:  Acute pain of right knee     Discharge Instructions     This is most likely arthritis related.  Continue to rest the knee you can put ice on the knee and elevate the knee Keep taking the Tylenol for pain as needed Follow-up with your doctor for any continued or worsening problems    ED Prescriptions    None     PDMP not reviewed this encounter.   Loura Halt A, NP 09/27/19 [phone]

## 2019-09-24 NOTE — Discharge Instructions (Addendum)
This is most likely arthritis related.  Continue to rest the knee you can put ice on the knee and elevate the knee Keep taking the Tylenol for pain as needed Follow-up with your doctor for any continued or worsening problems

## 2019-09-24 NOTE — ED Triage Notes (Signed)
Pt c/o right leg pain, right knee pain/swelling for approx 1 week. Denies injury to area. Denies SOB or other complaint. Medial aspect right knee area edematous.

## 2019-11-08 ENCOUNTER — Encounter (HOSPITAL_COMMUNITY): Payer: Self-pay

## 2019-11-08 ENCOUNTER — Ambulatory Visit (HOSPITAL_COMMUNITY)
Admission: EM | Admit: 2019-11-08 | Discharge: 2019-11-08 | Disposition: A | Discharge status: Home / Self Care | Payer: Medicare (Managed Care)

## 2019-11-08 ENCOUNTER — Other Ambulatory Visit: Payer: Self-pay

## 2019-11-08 DIAGNOSIS — M1711 Unilateral primary osteoarthritis, right knee: Secondary | ICD-10-CM | POA: Diagnosis not present

## 2019-11-08 DIAGNOSIS — M25562 Pain in left knee: Secondary | ICD-10-CM | POA: Diagnosis not present

## 2019-11-08 MED ORDER — PREDNISONE 10 MG (21) PO TBPK: ORAL_TABLET | Freq: Every day | ORAL | 0 refills | Status: AC

## 2019-11-08 NOTE — ED Triage Notes (Signed)
Pt is here with on & off right knee pain that started 2 months ago, pt is also having problems with leg/feet swelling that started a week ago. Pt has not taken anything to relieve discomfort.

## 2019-11-08 NOTE — ED Provider Notes (Signed)
Cassandra Cooke   [id number] 11/08/19 Arrival Time: 3903  ES:[facility]ZRA PAIN  SUBJECTIVE: History from: patient. Cassandra Cooke is a 79 y.o. female complains of right knee pain that began about over a month ago.  Reports that she has been seen here in this office and at [facility] and told that she has arthritis.  Reports that she has had images done on this knee as well.  Reports that she is taking in ibuprofen and Tylenol with little relief.  Reports that she is now having some lower leg swelling when her knee is bothering her.  Patient states that her pain is constant, worse with activity, aching character.  Has tried OTC medications without relief.  Denies similar symptoms in the past.  Denies fever, chills, erythema, ecchymosis, effusion, weakness, numbness and tingling, saddle paresthesias, loss of bowel or bladder function.      ROS: As per HPI.  All other pertinent ROS negative.     Past Medical History:  Diagnosis Date  . Allergy   . Anxiety    during a period of social upheaval  . Arthritis   . Bloating   . Cataract   . Colon polyps   . Diabetes mellitus type II    Borderline  . Diverticulosis of colon   . GERD (gastroesophageal reflux disease)   . Heartburn   . Hiatal hernia   . Hypertension   . Vitamin D deficiency    Past Surgical History:  Procedure Laterality Date  . CATARACT EXTRACTION     Bil  . COLONOSCOPY    . DILATION AND CURETTAGE OF UTERUS     1 miscarriage  . DILATION AND CURETTAGE OF UTERUS  01/2008   vaginal bleeding (Dr. Ronita Hipps)  . OOPHORECTOMY  1965   right due to cysts  . OOPHORECTOMY  1971   ovarian cystectomy left  . POLYPECTOMY    . VAGINAL DELIVERY     NSVD x 5   Allergies  Allergen Reactions  . Aspirin     REACTION: GI upset with high dose  . Hctz [Hydrochlorothiazide] Other (See Comments)    cramping  . Lisinopril     Causes cough   No current facility-administered medications on file prior to encounter.    Current Outpatient Medications on File Prior to Encounter  Medication Sig Dispense Refill  . albuterol (VENTOLIN HFA) 108 (90 Base) MCG/ACT inhaler Inhale 1-2 puffs into the lungs every 6 (six) hours as needed for wheezing. 18 g 1  . amLODipine (NORVASC) 10 MG tablet Take 1 tablet (10 mg total) by mouth daily. 90 tablet 3  . amoxicillin (AMOXIL) 500 MG tablet     . celecoxib (CELEBREX) 200 MG capsule Take 1 capsule (200 mg total) by mouth daily. 90 capsule 0  . cetirizine (ZYRTEC) 10 MG tablet Take 1 tablet (10 mg total) by mouth daily. 30 tablet 0  . Cholecalciferol (VITAMIN D) 50 MCG (2000 UT) CAPS Take 1 capsule (2,000 Units total) by mouth daily.    . fluticasone (FLONASE) 50 MCG/ACT nasal spray Place 1 spray into both nostrils daily. 16 g 1  . ibuprofen (ADVIL) 800 MG tablet Take 800 mg by mouth 3 (three) times daily.    . meclizine (ANTIVERT) 25 MG tablet Take 0.5-1 tablets (12.5-25 mg total) by mouth 3 (three) times daily as needed for dizziness (sedation caution). 30 tablet 0  . metoprolol succinate (TOPROL-XL) 50 MG 24 hr tablet Take 3 tablets (150 mg total) by mouth daily.  270 tablet 3  . omeprazole (PRILOSEC) 40 MG capsule TAKE 1 CAPSULE BY MOUTH TWICE A DAY AS NEEDED. 180 capsule 3   Social History   Socioeconomic History  . Marital status: Widowed    Spouse name: Not on file  . Number of children: 5  . Years of education: Not on file  . Highest education level: Not on file  Occupational History  . Occupation: Retired    Fish farm manager: RETIRED  Tobacco Use  . Smoking status: Former Smoker    Types: Cigarettes    Quit date: 07/16/1973    Years since quitting: 46.3  . Smokeless tobacco: Never Used  . Tobacco comment: quit over 40 years ago  Substance and Sexual Activity  . Alcohol use: No  . Drug use: No  . Sexual activity: Not Currently    Birth control/protection: None    Comment: Widow  Other Topics Concern  . Not on file  Social History Narrative   1st husband  died of cancer.    Widowed 2011/08/26, he had prostate cancer.    H/o mult foster children (none as of 08/26/19)      1 daughter died of renal disease   1 son with h/o colon cancer   2 other sons and 1 daughter healthy   Social Determinants of Health   Financial Resource Strain: Low Risk   . Difficulty of Paying Living Expenses: Not hard at all  Food Insecurity: No Food Insecurity  . Worried About Charity fundraiser in the Last Year: Never true  . Ran Out of Food in the Last Year: Never true  Transportation Needs: No Transportation Needs  . Lack of Transportation (Medical): No  . Lack of Transportation (Non-Medical): No  Physical Activity: Inactive  . Days of Exercise per Week: 0 days  . Minutes of Exercise per Session: 0 min  Stress: No Stress Concern Present  . Feeling of Stress : Only a little  Social Connections:   . Frequency of Communication with Friends and Family:   . Frequency of Social Gatherings with Friends and Family:   . Attends Religious Services:   . Active Member of Clubs or Organizations:   . Attends Archivist Meetings:   Marland Kitchen Marital Status:   Intimate Partner Violence: Not At Risk  . Fear of Current or Ex-Partner: No  . Emotionally Abused: No  . Physically Abused: No  . Sexually Abused: No   Family History  Problem Relation Age of Onset  . Hypertension Mother   . Heart disease Father        MI  . Hypertension Father   . Ulcers Brother        PUD  . Alcohol abuse Sister   . Alcohol abuse Sister   . Colon cancer Son   . Heart disease Brother   . Heart attack Brother   . Breast cancer Neg Hx   . Esophageal cancer Neg Hx   . Rectal cancer Neg Hx   . Stomach cancer Neg Hx     OBJECTIVE:  Vitals:   11/08/19 1646 11/08/19 1647  BP:  (!) 178/86  Pulse:  96  Resp:  18  Temp:  98 F (36.7 C)  TempSrc:  Oral  SpO2:  100%  Weight: 190 lb (86.2 kg)     General appearance: ALERT; in no acute distress.  Head: NCAT Lungs: Normal respiratory  effort CV: pedal pulses 2+ bilaterally. Cap refill < 2 seconds Musculoskeletal:  Inspection: Skin  warm, dry, clear and intact without obvious erythema, effusion, or ecchymosis.  Palpation: Nontender to palpation ROM: FROM active and passive Skin: warm and dry Neurologic: Ambulates without difficulty; Sensation intact about the upper/ lower extremities Psychological: alert and cooperative; normal mood and affect  DIAGNOSTIC STUDIES:  No results found.   ASSESSMENT & PLAN:  1. Left knee pain, unspecified chronicity   2. Primary osteoarthritis of right knee       Meds ordered this encounter  Medications  . predniSONE (STERAPRED UNI-PAK 21 TAB) 10 MG (21) TBPK tablet    Sig: Take by mouth daily for 6 days. Take 6 tablets on day 1, 5 tablets on day 2, 4 tablets on day 3, 3 tablets on day 4, 2 tablets on day 5, 1 tablet on day 6    Dispense:  21 tablet    Refill:  0    Order Specific Question:   Supervising Provider    Answer:   Chase Picket [id number]   Not concerned for blood clot today No calf pain, sensation intact, capillary refill less than 3 seconds, pedal pulses 2+ bilaterally Continue conservative management of rest, ice, and gentle stretches Take naproxen as needed for pain relief (may cause abdominal discomfort, ulcers, and GI bleeds avoid taking with other NSAIDs)  Really needs to follow-up with orthopedics Follow up with PCP if symptoms persist Return or go to the ER if you have any new or worsening symptoms (fever, chills, chest pain, abdominal pain, changes in bowel or bladder habits, pain radiating into lower legs)    Reviewed expectations re: course of current medical issues. Questions answered. Outlined signs and symptoms indicating need for more acute intervention. Patient verbalized understanding. After Visit Summary given.       Faustino Congress, NP 11/08/19 1724

## 2019-11-08 NOTE — Discharge Instructions (Signed)
I have sent in a prednisone taper for you to take for 6 days. 6 tablets on day one, 5 tablets on day two, 4 tablets on day three, 3 tablets on day four, 2 tablets on day five, and 1 tablet on day six.  If this is not helping, I want you to follow-up with orthopedics as soon as you can this week

## 2020-09-12 IMAGING — DX LEFT SHOULDER - 2+ VIEW
3 series · 3 of 3 positions shown · non-contrast
Comparison: None.

CLINICAL DATA: L shoulder pain

EXAM:
LEFT SHOULDER - 2+ VIEW

[shoulder axial]
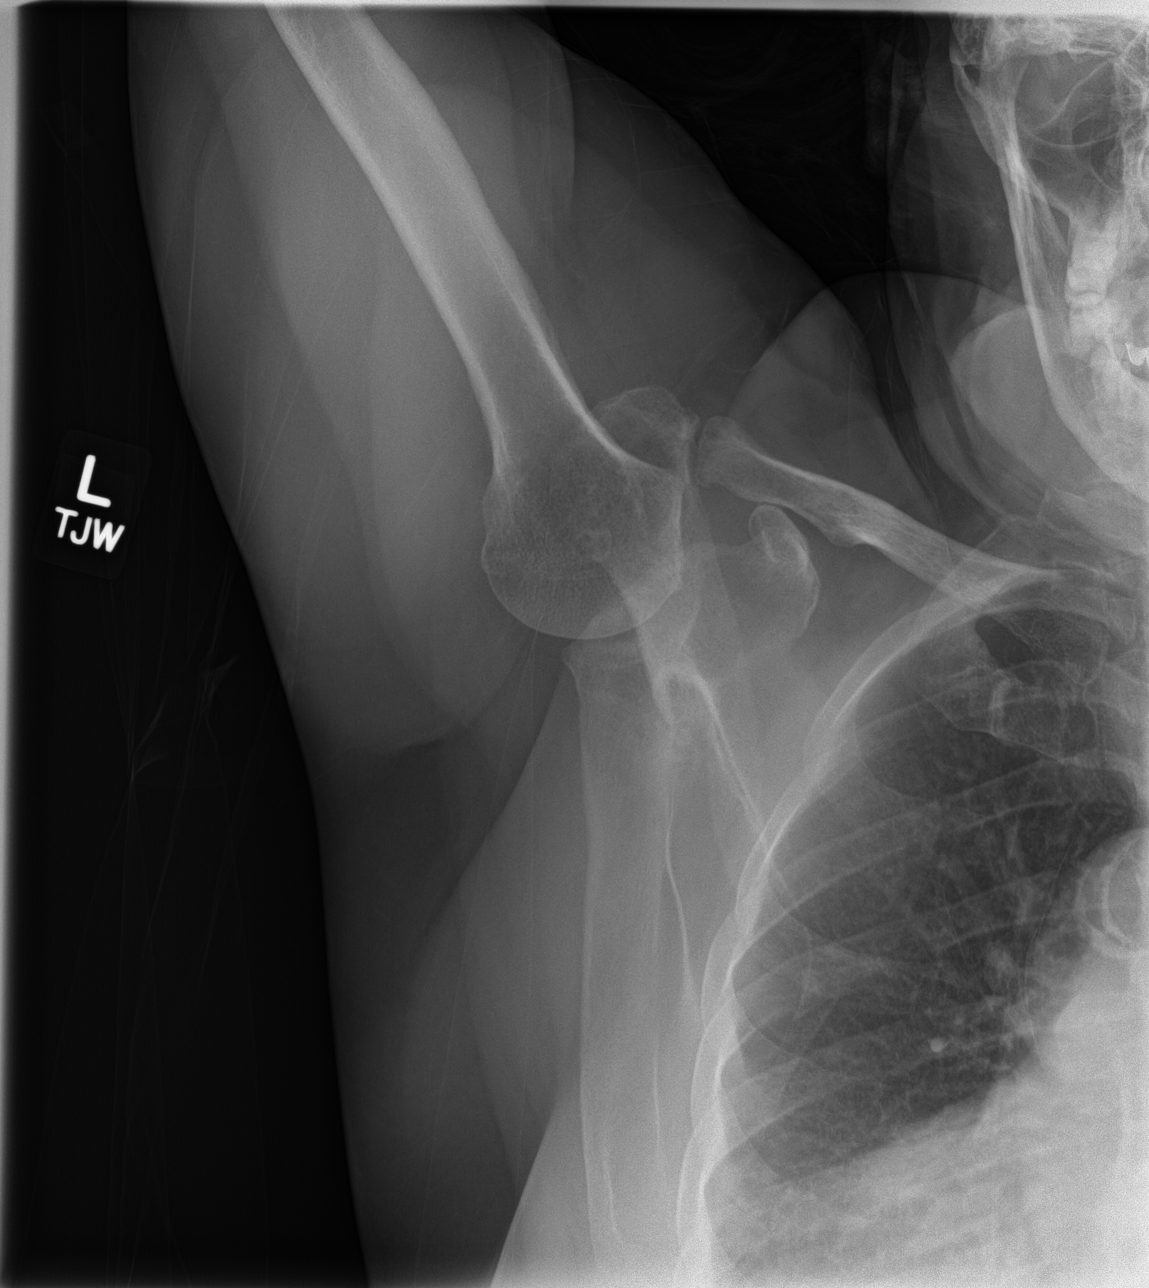

[shoulder ap]
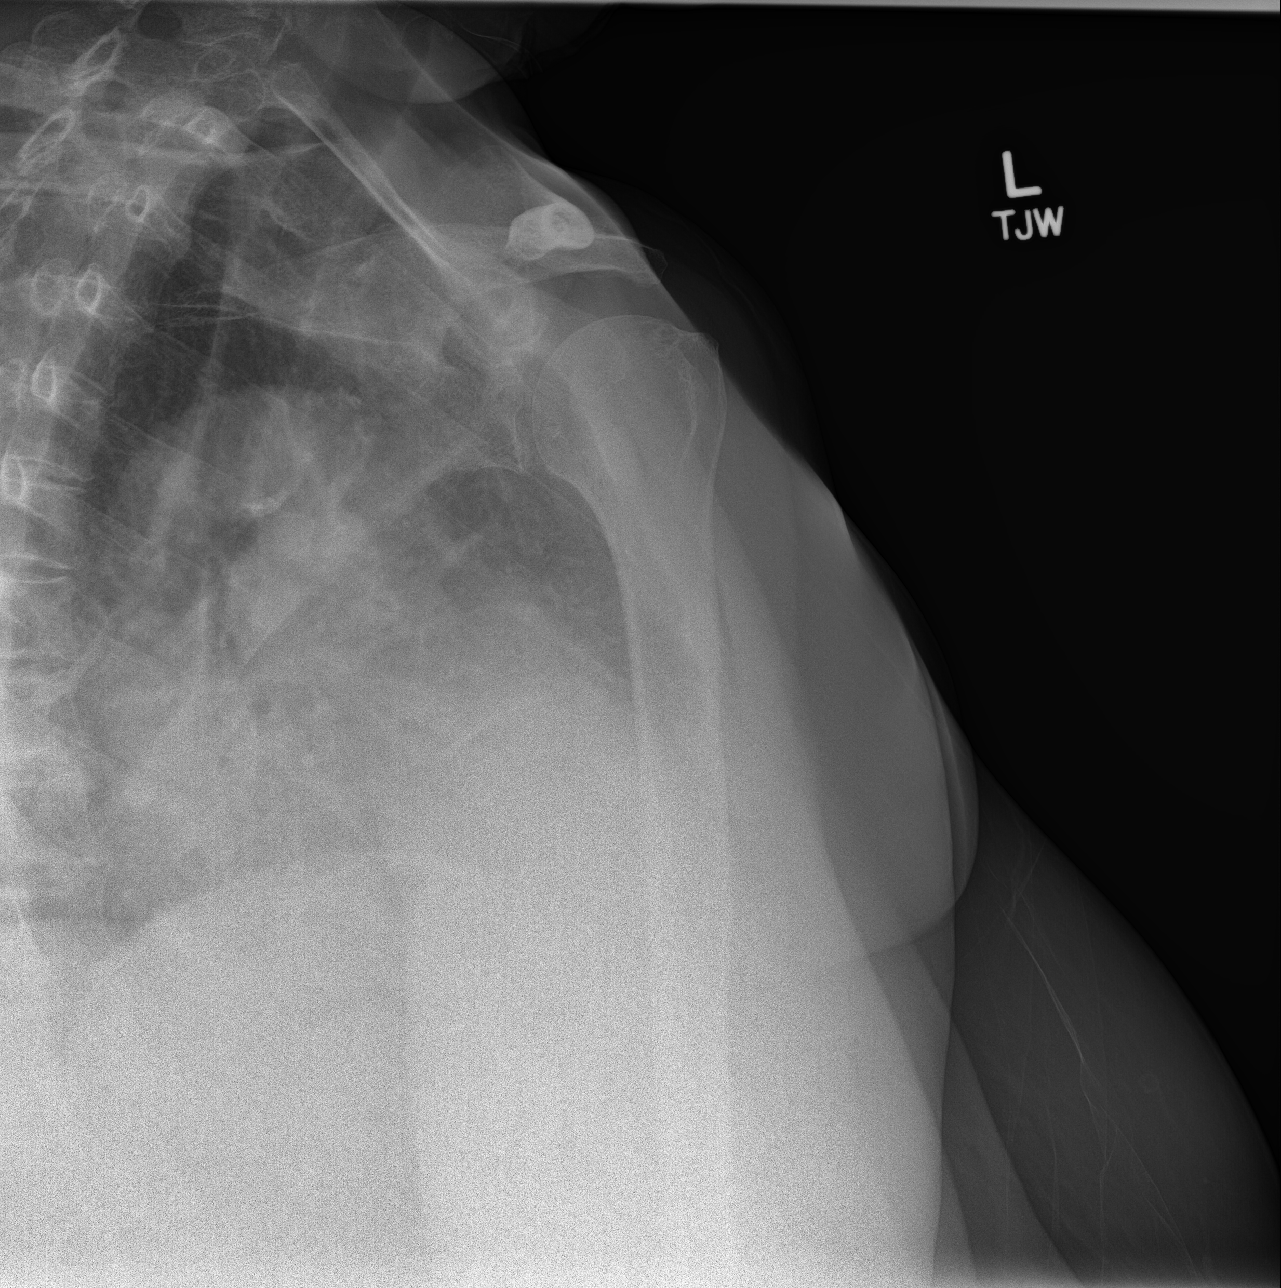

[shoulder y-view]
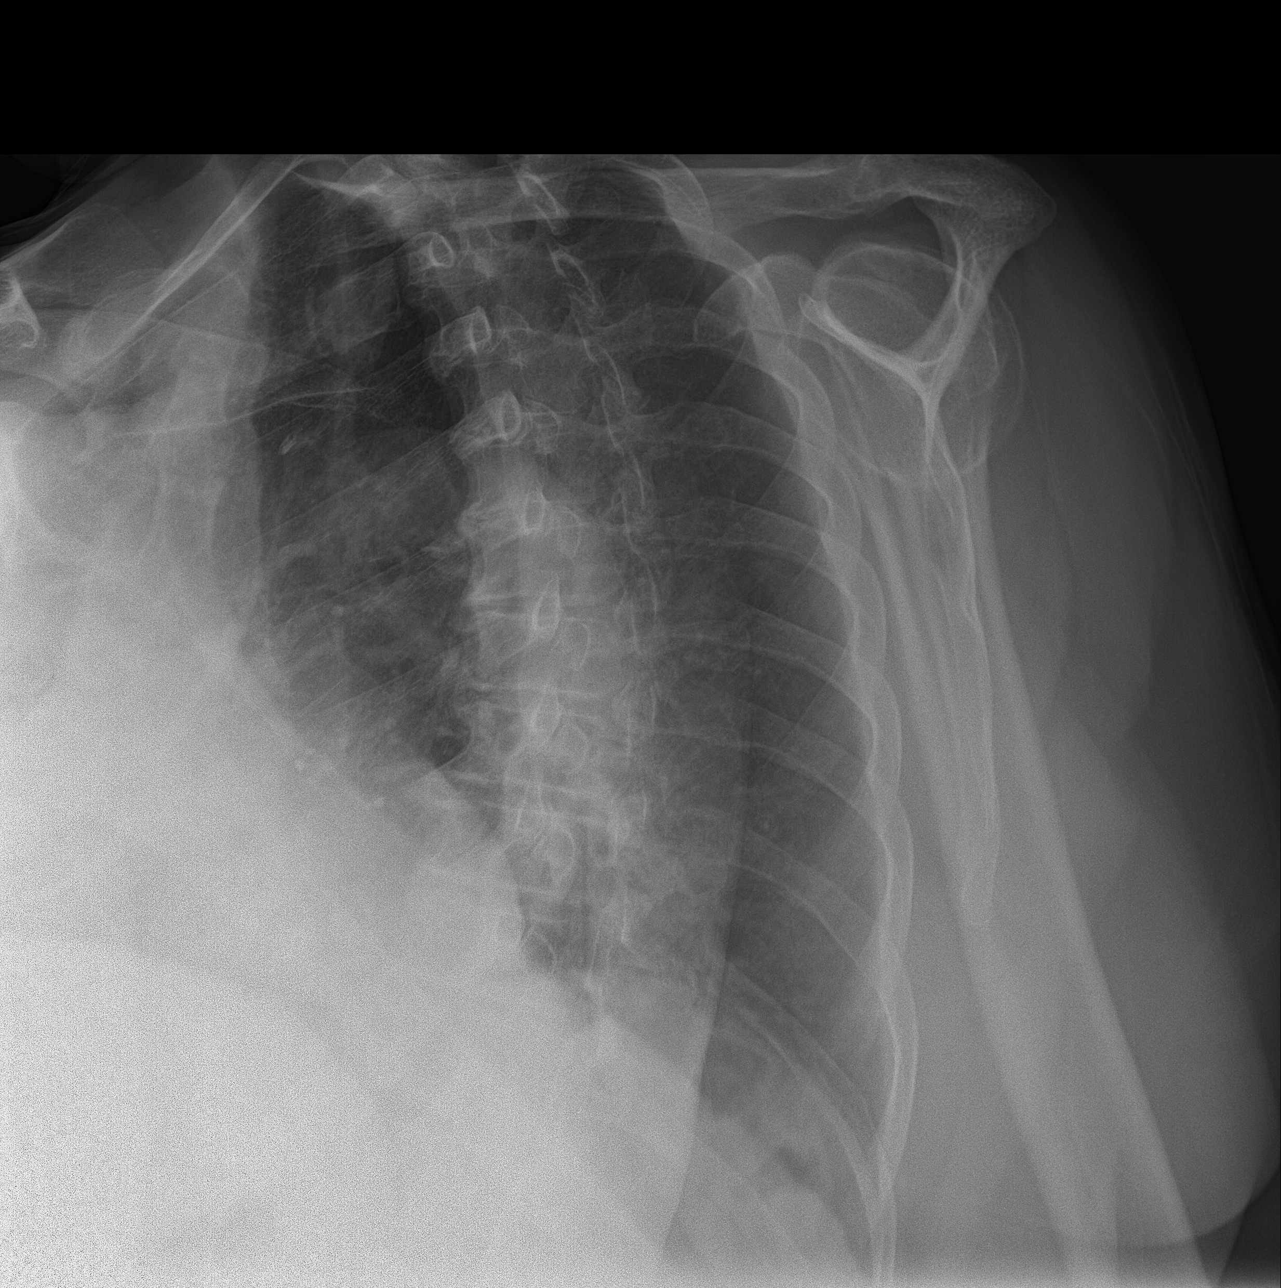

[3 of 3 positions shown; findings below may reference images not displayed]

FINDINGS: There is no evidence of fracture or dislocation. Moderate
degenerative changes the glenohumeral and acromioclavicular joints.
No suspicious bony lesion. Soft tissues are unremarkable. Adjacent
ribs and lungs without acute abnormality.
IMPRESSION: Moderate degenerative changes in the left shoulder without acute
osseous abnormality.

## 2020-11-16 ENCOUNTER — Other Ambulatory Visit: Payer: Self-pay

## 2020-11-16 ENCOUNTER — Ambulatory Visit (INDEPENDENT_AMBULATORY_CARE_PROVIDER_SITE_OTHER): Payer: Medicare HMO | Admitting: Family Medicine

## 2020-11-16 ENCOUNTER — Encounter: Payer: Self-pay | Admitting: Family Medicine

## 2020-11-16 VITALS — BP 152/86 | HR 56 | Temp 97.7°F | Ht 63.0 in | Wt 197.0 lb

## 2020-11-16 DIAGNOSIS — M25569 Pain in unspecified knee: Secondary | ICD-10-CM

## 2020-11-16 DIAGNOSIS — I10 Essential (primary) hypertension: Secondary | ICD-10-CM

## 2020-11-16 DIAGNOSIS — E559 Vitamin D deficiency, unspecified: Secondary | ICD-10-CM

## 2020-11-16 DIAGNOSIS — R739 Hyperglycemia, unspecified: Secondary | ICD-10-CM

## 2020-11-16 LAB — COMPREHENSIVE METABOLIC PANEL
ALT: 20 U/L (ref 0–35)
AST: 21 U/L (ref 0–37)
Albumin: 4.3 g/dL (ref 3.5–5.2)
Alkaline Phosphatase: 86 U/L (ref 39–117)
BUN: 10 mg/dL (ref 6–23)
CO2: 26 mEq/L (ref 19–32)
Calcium: 10.1 mg/dL (ref 8.4–10.5)
Chloride: 106 mEq/L (ref 96–112)
Creatinine, Ser: 0.69 mg/dL (ref 0.40–1.20)
GFR: 83.22 mL/min (ref >60.00)
Glucose, Bld: 92 mg/dL (ref 70–99)
Potassium: 4.6 mEq/L (ref 3.5–5.1)
Sodium: 140 mEq/L (ref 135–145)
Total Bilirubin: 0.4 mg/dL (ref 0.2–1.2)
Total Protein: 7.4 g/dL (ref 6.0–8.3)

## 2020-11-16 LAB — VITAMIN D 25 HYDROXY (VIT D DEFICIENCY, FRACTURES): VITD: 27.87 ng/mL — ABNORMAL LOW (ref 30.00–100.00)

## 2020-11-16 LAB — LIPID PANEL
Cholesterol: 207 mg/dL — ABNORMAL HIGH (ref 0–200)
HDL: 52.3 mg/dL (ref >39.00)
LDL Cholesterol: 135 mg/dL — ABNORMAL HIGH (ref 0–99)
NonHDL: 154.49
Total CHOL/HDL Ratio: 4
Triglycerides: 96 mg/dL (ref 0.0–149.0)
VLDL: 19.2 mg/dL (ref 0.0–40.0)

## 2020-11-16 LAB — HEMOGLOBIN A1C: Hgb A1c MFr Bld: 6.3 % (ref 4.6–6.5)

## 2020-11-16 MED ORDER — TRAMADOL HCL 50 MG PO TABS: 50.0000 mg | ORAL_TABLET | Freq: Two times a day (BID) | ORAL | 1 refills | Status: DC | PRN

## 2020-11-16 NOTE — Patient Instructions (Signed)
Go to the lab on the way out.   If you have mychart we'll likely use that to update you.    Try taking tramadol if needed for pain, sedation caution.  Let me know if that isn't helping.  If you take aleve, take it with food.  Take care.  Glad to see you.

## 2020-11-16 NOTE — Progress Notes (Signed)
This visit occurred during the SARS-CoV-2 public health emergency.  Safety protocols were in place, including screening questions prior to the visit, additional usage of staff PPE, and extensive cleaning of exam room while observing appropriate contact time as indicated for disinfecting solutions.  Leg pain, dec pain with aleve- that helped more than celebrex.  R>L knee pain.  She had prev injections.  Known OA.  More pain walking.  Some swelling.  No redness or bruising.  No locking of the knees.  She has some hip pain but the knees hurt more.  Some ankle pain.  Some days are better than others.  She wants to put off surgery if possible.    Hypertension:    Using medication without problems or lightheadedness:  yes Chest pain with exertion:no Edema: no Short of breath:not limiting exercise- she had trouble walking from knee pain.  Average home BPs: usually ~150/90, unclear how much that is pain related.   Labs pending.    H/o hyperglycemia but not DM2.  Labs pending.    Meds, vitals, and allergies reviewed.   ROS: Per HPI unless specifically indicated in ROS section   GEN: nad, alert and oriented HEENT: ncat NECK: supple w/o LA CV: rrr.  PULM: ctab, no inc wob ABD: soft, +bs EXT: no edema SKIN: no acute rash R knee normal ROM w/o crepitus but ttp on lateral joint line.

## 2020-11-17 ENCOUNTER — Telehealth: Payer: Self-pay | Admitting: *Deleted

## 2020-11-17 NOTE — Telephone Encounter (Signed)
Ms. Khatoon left voicemail on triage requesting her labs results from yesterday.  She states she was unable to retrieve them from her MyChart.

## 2020-11-17 NOTE — Telephone Encounter (Signed)
Agree.  Thanks.  Will address when possible.

## 2020-11-17 NOTE — Telephone Encounter (Signed)
Spoke with patient and advised her that Dr Damita Dunnings has not looked at the results yet and that the mychart just realized the results and that was what she saw and why she could not see any notes from the doctor.

## 2020-11-19 NOTE — Assessment & Plan Note (Signed)
See notes on follow-up labs.  Continue metoprolol and amlodipine

## 2020-11-19 NOTE — Assessment & Plan Note (Signed)
Discussed options. She can try taking tramadol if needed for pain, sedation caution.  I want her to let me know if that isn't helping.  If taking aleve, take it with food.  She agrees with plan.

## 2020-11-19 NOTE — Assessment & Plan Note (Signed)
See notes on follow-up labs. 

## 2020-11-20 ENCOUNTER — Telehealth: Payer: Self-pay

## 2020-11-20 DIAGNOSIS — E559 Vitamin D deficiency, unspecified: Secondary | ICD-10-CM

## 2020-11-20 NOTE — Progress Notes (Signed)
Spoke with patient about lab results. Patient verbalized understanding. Patient was not sure about Vit D dose as she is not home and has been out of town; she will call back once she's home. She has not started the pain medication yet either since she has been out of town.

## 2020-11-20 NOTE — Telephone Encounter (Signed)
Pt called for results of recent labs; I started to give pt information as instructed from lab result notes and pt said that she takes her vit D once or twice a week as instructed. Med list has take one daily. Pt will try to find her Vit D bottle with instructions from  her prescription bottle and request cb to go over results and verify how pt has been taking her vit D and get remainder of lab result notes.. Sending note to [facility].

## 2020-11-20 NOTE — Telephone Encounter (Signed)
Spoke with patient about lab results. Patient was still out of town when I called her back and did not know her Vit D dose. She will call back once she's back home.

## 2020-11-23 NOTE — Telephone Encounter (Signed)
Called patient back today to get update on Vit D. Patient is actually taking 50000 units of Vitamin D weekly. She states she has missed a dose or two in the past. She has one capsule left and does not think there are any more refills on the bottle. Per result note you thought patient was taking 2000 units QD but she is not. I advised patient I would call back once I found out what Dr. Damita Dunnings wanted her to take. Please advise.

## 2020-11-24 MED ORDER — VITAMIN D (ERGOCALCIFEROL) 1.25 MG (50000 UNIT) PO CAPS: 50000.0000 [IU] | ORAL_CAPSULE | ORAL | 0 refills | Status: DC

## 2020-11-24 NOTE — Telephone Encounter (Signed)
Was on the phone given her instructions per Dr. Damita Dunnings and the phone call dropped. Tried to call patient back and got her voicemail. Let a message on voicemail for patient to call the office back.

## 2020-11-24 NOTE — Telephone Encounter (Addendum)
Given her historical tendency for a low vitamin D, I would continue taking vitamin D weekly for another 3 months and then recheck a level.  I sent a new prescription and put in the lab order.  She would not need to take vitamin D daily in addition to the weekly dose.  Please help her get scheduled for lab visit in about 3 months.  Thanks.

## 2020-11-24 NOTE — Addendum Note (Signed)
Addended by: Tonia Ghent on: 11/24/2020 12:48 PM   Modules accepted: Orders

## 2020-11-27 NOTE — Telephone Encounter (Signed)
Called patient and gave instructions per Dr. Damita Dunnings. Advised patient new rx was sent in for her for the Vit D. Lab appt made for 02/27/21 at 9:15 am.

## 2020-11-30 ENCOUNTER — Ambulatory Visit (INDEPENDENT_AMBULATORY_CARE_PROVIDER_SITE_OTHER): Payer: Medicare HMO

## 2020-11-30 ENCOUNTER — Other Ambulatory Visit: Payer: Medicare (Managed Care)

## 2020-11-30 DIAGNOSIS — Z0000 Encounter for general adult medical examination without abnormal findings: Secondary | ICD-10-CM | POA: Diagnosis not present

## 2020-11-30 NOTE — Patient Instructions (Signed)
Cassandra Cooke , Thank you for taking time to come for your Medicare Wellness Visit. I appreciate your ongoing commitment to your health goals. Please review the following plan we discussed and let me know if I can assist you in the future.   Screening recommendations/referrals: Colonoscopy: Up to date, completed 01/21/2018, no longer required  Mammogram: no longer required  Bone Density: due, will discuss with provider at physical  Recommended yearly ophthalmology/optometry visit for glaucoma screening and checkup Recommended yearly dental visit for hygiene and checkup  Vaccinations: Influenza vaccine: due Fall 2022 Pneumococcal vaccine: Completed series Tdap vaccine: Up to date, completed 01/25/2013, due 01/2023 Shingles vaccine: due, check with your insurance regarding coverage if interested    Covid-19:Completed 3 vaccines. Bring card to physical so dates can be documented in chart  Advanced directives: Please bring a copy of your POA (Power of [place]) and/or Living Will to your next appointment.   Conditions/risks identified: hypertension, hypercholesterolemia   Next appointment: Follow up in one year for your annual wellness visit    Preventive Care 78 Years and Older, Female Preventive care refers to lifestyle choices and visits with your health care provider that can promote health and wellness. What does preventive care include? A yearly physical exam. This is also called an annual well check. Dental exams once or twice a year. Routine eye exams. Ask your health care provider how often you should have your eyes checked. Personal lifestyle choices, including: Daily care of your teeth and gums. Regular physical activity. Eating a healthy diet. Avoiding tobacco and drug use. Limiting alcohol use. Practicing safe sex. Taking low-dose aspirin every day. Taking vitamin and mineral supplements as recommended by your health care provider. What happens during an annual well  check? The services and screenings done by your health care provider during your annual well check will depend on your age, overall health, lifestyle risk factors, and family history of disease. Counseling  Your health care provider may ask you questions about your: Alcohol use. Tobacco use. Drug use. Emotional well-being. Home and relationship well-being. Sexual activity. Eating habits. History of falls. Memory and ability to understand (cognition). Work and work Statistician. Reproductive health. Screening  You may have the following tests or measurements: Height, weight, and BMI. Blood pressure. Lipid and cholesterol levels. These may be checked every 5 years, or more frequently if you are over 69 years old. Skin check. Lung cancer screening. You may have this screening every year starting at age 78 if you have a 30-pack-year history of smoking and currently smoke or have quit within the past 15 years. Fecal occult blood test (FOBT) of the stool. You may have this test every year starting at age 78. Flexible sigmoidoscopy or colonoscopy. You may have a sigmoidoscopy every 5 years or a colonoscopy every 10 years starting at age 78. Hepatitis C blood test. Hepatitis B blood test. Sexually transmitted disease (STD) testing. Diabetes screening. This is done by checking your blood sugar (glucose) after you have not eaten for a while (fasting). You may have this done every 1-3 years. Bone density scan. This is done to screen for osteoporosis. You may have this done starting at age 78. Mammogram. This may be done every 1-2 years. Talk to your health care provider about how often you should have regular mammograms. Talk with your health care provider about your test results, treatment options, and if necessary, the need for more tests. Vaccines  Your health care provider may recommend certain vaccines, such as:  Influenza vaccine. This is recommended every year. Tetanus, diphtheria, and  acellular pertussis (Tdap, Td) vaccine. You may need a Td booster every 10 years. Zoster vaccine. You may need this after age 78. Pneumococcal 13-valent conjugate (PCV13) vaccine. One dose is recommended after age 78. Pneumococcal polysaccharide (PPSV23) vaccine. One dose is recommended after age 78. Talk to your health care provider about which screenings and vaccines you need and how often you need them. This information is not intended to replace advice given to you by your health care provider. Make sure you discuss any questions you have with your health care provider. Document Released: 05/19/2015 Document Revised: 01/10/2016 Document Reviewed: 02/21/2015 Elsevier Interactive Patient Education  2017 [place] Prevention in the Home Falls can cause injuries. They can happen to people of all ages. There are many things you can do to make your home safe and to help prevent falls. What can I do on the outside of my home? Regularly fix the edges of walkways and driveways and fix any cracks. Remove anything that might make you trip as you walk through a door, such as a raised step or threshold. Trim any bushes or trees on the path to your home. Use bright outdoor lighting. Clear any walking paths of anything that might make someone trip, such as rocks or tools. Regularly check to see if handrails are loose or broken. Make sure that both sides of any steps have handrails. Any raised decks and porches should have guardrails on the edges. Have any leaves, snow, or ice cleared regularly. Use sand or salt on walking paths during winter. Clean up any spills in your garage right away. This includes oil or grease spills. What can I do in the bathroom? Use night lights. Install grab bars by the toilet and in the tub and shower. Do not use towel bars as grab bars. Use non-skid mats or decals in the tub or shower. If you need to sit down in the shower, use a plastic, non-slip stool. Keep  the floor dry. Clean up any water that spills on the floor as soon as it happens. Remove soap buildup in the tub or shower regularly. Attach bath mats securely with double-sided non-slip rug tape. Do not have throw rugs and other things on the floor that can make you trip. What can I do in the bedroom? Use night lights. Make sure that you have a light by your bed that is easy to reach. Do not use any sheets or blankets that are too big for your bed. They should not hang down onto the floor. Have a firm chair that has side arms. You can use this for support while you get dressed. Do not have throw rugs and other things on the floor that can make you trip. What can I do in the kitchen? Clean up any spills right away. Avoid walking on wet floors. Keep items that you use a lot in easy-to-reach places. If you need to reach something above you, use a strong step stool that has a grab bar. Keep electrical cords out of the way. Do not use floor polish or wax that makes floors slippery. If you must use wax, use non-skid floor wax. Do not have throw rugs and other things on the floor that can make you trip. What can I do with my stairs? Do not leave any items on the stairs. Make sure that there are handrails on both sides of the stairs and use them.  Fix handrails that are broken or loose. Make sure that handrails are as long as the stairways. Check any carpeting to make sure that it is firmly attached to the stairs. Fix any carpet that is loose or worn. Avoid having throw rugs at the top or bottom of the stairs. If you do have throw rugs, attach them to the floor with carpet tape. Make sure that you have a light switch at the top of the stairs and the bottom of the stairs. If you do not have them, ask someone to add them for you. What else can I do to help prevent falls? Wear shoes that: Do not have high heels. Have rubber bottoms. Are comfortable and fit you well. Are closed at the toe. Do not  wear sandals. If you use a stepladder: Make sure that it is fully opened. Do not climb a closed stepladder. Make sure that both sides of the stepladder are locked into place. Ask someone to hold it for you, if possible. Clearly mark and make sure that you can see: Any grab bars or handrails. First and last steps. Where the edge of each step is. Use tools that help you move around (mobility aids) if they are needed. These include: Canes. Walkers. Scooters. Crutches. Turn on the lights when you go into a dark area. Replace any light bulbs as soon as they burn out. Set up your furniture so you have a clear path. Avoid moving your furniture around. If any of your floors are uneven, fix them. If there are any pets around you, be aware of where they are. Review your medicines with your doctor. Some medicines can make you feel dizzy. This can increase your chance of falling. Ask your doctor what other things that you can do to help prevent falls. This information is not intended to replace advice given to you by your health care provider. Make sure you discuss any questions you have with your health care provider. Document Released: 02/16/2009 Document Revised: 09/28/2015 Document Reviewed: 05/27/2014 Elsevier Interactive Patient Education  2017 [employer].

## 2020-11-30 NOTE — Progress Notes (Addendum)
Subjective:   Cassandra Cooke is a 80 y.o. female who presents for Medicare Annual (Subsequent) preventive examination.  Review of Systems: N/A      I connected with the patient today by telephone and verified that I am speaking with the correct person using two identifiers. Location patient: home Location nurse: work Persons participating in the telephone visit: patient, nurse.   I discussed the limitations, risks, security and privacy concerns of performing an evaluation and management service by telephone and the availability of in person appointments. I also discussed with the patient that there may be a patient responsible charge related to this service. The patient expressed understanding and verbally consented to this telephonic visit.        Cardiac Risk Factors include: advanced age (>15mn, >>67women);hypertension;Other (see comment), Risk factor comments: hypercholesterolemia     Objective:    Today's Vitals   11/30/20 1155  PainSc: 8    There is no height or weight on file to calculate BMI.  Advanced Directives 11/30/2020 06/29/2019 10/02/2018 07/29/2016  Does Patient Have a Medical Advance Directive? Yes No No No  Type of AParamedicof AGalenaLiving will - - -  Copy of [place] Chart? No - copy requested - - -  Would patient like information on creating a medical advance directive? - No - Patient declined No - Patient declined -    Current Medications (verified) Outpatient Encounter Medications as of 11/30/2020  Medication Sig   albuterol (VENTOLIN HFA) 108 (90 Base) MCG/ACT inhaler Inhale 1-2 puffs into the lungs every 6 (six) hours as needed for wheezing.   amLODipine (NORVASC) 10 MG tablet Take 1 tablet (10 mg total) by mouth daily.   cetirizine (ZYRTEC) 10 MG tablet Take 1 tablet (10 mg total) by mouth daily.   fluticasone (FLONASE) 50 MCG/ACT nasal spray Place 1 spray into both nostrils daily.   meclizine  (ANTIVERT) 25 MG tablet Take 0.5-1 tablets (12.5-25 mg total) by mouth 3 (three) times daily as needed for dizziness (sedation caution).   metoprolol succinate (TOPROL-XL) 50 MG 24 hr tablet Take 3 tablets (150 mg total) by mouth daily.   naproxen sodium (ALEVE) 220 MG tablet Take 220 mg by mouth 2 (two) times daily as needed. With food   omeprazole (PRILOSEC) 40 MG capsule TAKE 1 CAPSULE BY MOUTH TWICE A DAY AS NEEDED.   traMADol (ULTRAM) 50 MG tablet Take 1 tablet (50 mg total) by mouth every 12 (twelve) hours as needed.   Vitamin D, Ergocalciferol, (DRISDOL) 1.25 MG (50000 UNIT) CAPS capsule Take 1 capsule (50,000 Units total) by mouth every 7 (seven) days.   No facility-administered encounter medications on file as of 11/30/2020.    Allergies (verified) Aspirin, Hctz [hydrochlorothiazide], and Lisinopril   History: Past Medical History:  Diagnosis Date   Allergy    Anxiety    during a period of social upheaval   Arthritis    Bloating    Cataract    Colon polyps    Diabetes mellitus type II    Borderline   Diverticulosis of colon    GERD (gastroesophageal reflux disease)    Heartburn    Hiatal hernia    Hypertension    Vitamin D deficiency    Past Surgical History:  Procedure Laterality Date   CATARACT EXTRACTION     Bil   COLONOSCOPY     DILATION AND CURETTAGE OF UTERUS     1 miscarriage  DILATION AND CURETTAGE OF UTERUS  01/2008   vaginal bleeding (Dr. Ronita Hipps)   [place]   right due to cysts   OOPHORECTOMY  1971   ovarian cystectomy left   POLYPECTOMY     VAGINAL DELIVERY     NSVD x 5   Family History  Problem Relation Age of Onset   Hypertension Mother    Heart disease Father        MI   Hypertension Father    Ulcers Brother        PUD   Alcohol abuse Sister    Alcohol abuse Sister    Colon cancer Son    Heart disease Brother    Heart attack Brother    Breast cancer Neg Hx    Esophageal cancer Neg Hx    Rectal cancer Neg Hx    Stomach  cancer Neg Hx    Social History   Socioeconomic History   Marital status: Widowed    Spouse name: Not on file   Number of children: 5   Years of education: Not on file   Highest education level: Not on file  Occupational History   Occupation: Retired    Fish farm manager: RETIRED  Tobacco Use   Smoking status: Former    Types: Cigarettes    Quit date: 07/16/1973    Years since quitting: 47.4   Smokeless tobacco: Never   Tobacco comments:    quit over 40 years ago  Substance and Sexual Activity   Alcohol use: No   Drug use: No   Sexual activity: Not Currently    Birth control/protection: None    Comment: Widow  Other Topics Concern   Not on file  Social History Narrative   1st husband died of cancer.    Widowed 08-21-11, he had prostate cancer.    H/o mult foster children (none as of 08/21/2019)      1 daughter died of renal disease   1 son with h/o colon cancer   2 other sons and 1 daughter healthy   Social Determinants of Health   Financial Resource Strain: Low Risk    Difficulty of Paying Living Expenses: Not hard at all  Food Insecurity: No Food Insecurity   Worried About Charity fundraiser in the Last Year: Never true   Arboriculturist in the Last Year: Never true  Transportation Needs: No Transportation Needs   Lack of Transportation (Medical): No   Lack of Transportation (Non-Medical): No  Physical Activity: Inactive   Days of Exercise per Week: 0 days   Minutes of Exercise per Session: 0 min  Stress: No Stress Concern Present   Feeling of Stress : Not at all  Social Connections: Not on file    Tobacco Counseling Counseling given: Not Answered Tobacco comments: quit over 40 years ago   Clinical Intake:  Pre-visit preparation completed: Yes  Pain : 0-10 Pain Score: 8  Pain Type: Chronic pain Pain Location: Leg Pain Orientation: Right Pain Descriptors / Indicators: Aching Pain Onset: More than a month ago Pain Frequency: Intermittent     Nutritional Risks:  None Diabetes: No  How often do you need to have someone help you when you read instructions, pamphlets, or other written materials from your doctor or pharmacy?: 1 - Never  Diabetic: No Nutrition Risk Assessment:  Has the patient had any N/V/D within the last 2 months?  No  Does the patient have any non-healing wounds?  No  Has  the patient had any unintentional weight loss or weight gain?  No   Diabetes:  Is the patient diabetic?  No  If diabetic, was a CBG obtained today?   N/A Did the patient bring in their glucometer from home?   N/A How often do you monitor your CBG's? N/A.   Financial Strains and Diabetes Management:  Are you having any financial strains with the device, your supplies or your medication?  N/A .  Does the patient want to be seen by Chronic Care Management for management of their diabetes?   N/A Would the patient like to be referred to a Nutritionist or for Diabetic Management?   N/A    Interpreter Needed?: No  Information entered by :: CJohnson, RN   Activities of Daily Living In your present state of health, do you have any difficulty performing the following activities: 11/30/2020 11/16/2020  Hearing? N N  Vision? N N  Difficulty concentrating or making decisions? N N  Walking or climbing stairs? N Y  Dressing or bathing? N N  Doing errands, shopping? N N  Preparing Food and eating ? N -  Using the Toilet? N -  In the past six months, have you accidently leaked urine? N -  Do you have problems with loss of bowel control? N -  Managing your Medications? N -  Managing your Finances? N -  Housekeeping or managing your Housekeeping? N -  Some recent data might be hidden    Patient Care Team: Tonia Ghent, MD as PCP - General (Family Medicine)  Indicate any recent Medical Services you may have received from other than [facility] providers in the past year (date may be approximate).     Assessment:   This is a routine wellness examination for  [place].  Hearing/Vision screen Vision Screening - Comments:: Patient gets annual eye exams   Dietary issues and exercise activities discussed: Current Exercise Habits: The patient does not participate in regular exercise at present, Exercise limited by: None identified   Goals Addressed             This Visit's Progress    Patient Stated       11/30/2020, I will maintain and continue medications as prescribed.       Depression Screen PHQ 2/9 Scores 11/30/2020 11/16/2020 06/29/2019 09/01/2017 07/29/2016 07/18/2015 01/12/2015  PHQ - 2 Score 0 0 0 0 0 0 0  PHQ- 9 Score 0 1 0 - - - -    Fall Risk Fall Risk  11/30/2020 11/16/2020 06/29/2019 09/01/2017 08/29/2017  Falls in the past year? 0 0 0 No No  Comment - - - - -  Number falls in past yr: 0 0 0 - -  Injury with Fall? 0 0 0 - -  Comment - - - - -  Risk for fall due to : Medication side effect - Impaired balance/gait - -  Follow up Falls evaluation completed;Falls prevention discussed - Falls evaluation completed;Falls prevention discussed - -    FALL RISK PREVENTION PERTAINING TO THE HOME:  Any stairs in or around the home? Yes  If so, are there any without handrails? No  Home free of loose throw rugs in walkways, pet beds, electrical cords, etc? Yes  Adequate lighting in your home to reduce risk of falls? Yes   ASSISTIVE DEVICES UTILIZED TO PREVENT FALLS:  Life alert? No  Use of a cane, walker or w/c? Yes  Grab bars in the bathroom? No  Shower chair  or bench in shower? No  Elevated toilet seat or a handicapped toilet? No   TIMED UP AND GO:  Was the test performed?  N/A telephone visit .    Cognitive Function: MMSE - Mini Mental State Exam 11/30/2020 06/29/2019 07/29/2016  Orientation to time '5 5 5  '$ Orientation to Place '5 5 5  '$ Registration '3 3 3  '$ Attention/ Calculation 5 5 0  Recall '3 3 3  '$ Language- name 2 objects - - 0  Language- repeat '1 1 1  '$ Language- follow 3 step command - - 3  Language- read & follow direction -  - 0  Write a sentence - - 0  Copy design - - 0  Total score - - 20  Mini Cog  Mini-Cog screen was completed. Maximum score is 22. A value of 0 denotes this part of the MMSE was not completed or the patient failed this part of the Mini-Cog screening.       Immunizations Immunization History  Administered Date(s) Administered   Influenza Whole 03/06/2005   Influenza, High Dose Seasonal PF 04/08/2018   Influenza,inj,Quad PF,6+ Mos 01/07/2014, 01/12/2015   Influenza-Unspecified 05/20/2016   Pneumococcal Conjugate-13 08/08/2016   Pneumococcal Polysaccharide-23 08/29/2017   Td 03/06/2005, 01/25/2013    TDAP status: Up to date  Flu Vaccine status: due Fall 2022  Pneumococcal vaccine status: Up to date  Covid-19 vaccine status: Completed 3 vaccines. Will bring card to physical so dates can be documented in chart  Qualifies for Shingles Vaccine? Yes   Zostavax completed No   Shingrix Completed?: No.    Education has been provided regarding the importance of this vaccine. Patient has been advised to call insurance company to determine out of pocket expense if they have not yet received this vaccine. Advised may also receive vaccine at local pharmacy or Health Dept. Verbalized acceptance and understanding.  Screening Tests Health Maintenance  Topic Date Due   COVID-19 Vaccine (1) Never done   Hepatitis C Screening  Never done   Zoster Vaccines- Shingrix (1 of 2) Never done   MAMMOGRAM  05/27/2018   INFLUENZA VACCINE  12/04/2020   COLONOSCOPY (Pts 45-82yr Insurance coverage will need to be confirmed)  01/22/2023   TETANUS/TDAP  01/26/2023   DEXA SCAN  Completed   PNA vac Low Risk Adult  Completed   HPV VACCINES  Aged Out    Health Maintenance  Health Maintenance Due  Topic Date Due   COVID-19 Vaccine (1) Never done   Hepatitis C Screening  Never done   Zoster Vaccines- Shingrix (1 of 2) Never done   MAMMOGRAM  05/27/2018    Colorectal cancer screening: Type of  screening: Colonoscopy. Completed 01/21/2018. No longer required  Mammogram status: No longer required due to age.  Bone Density status: due, will discuss with provider at physical   Lung Cancer Screening: (Low Dose CT Chest recommended if Age 78-80years, 30 pack-year currently smoking OR have quit w/in 15years.) does not qualify.  Additional Screening:  Hepatitis C Screening: does qualify; Completed due  Vision Screening: Recommended annual ophthalmology exams for early detection of glaucoma and other disorders of the eye. Is the patient up to date with their annual eye exam?  Yes  Who is the provider or what is the name of the office in which the patient attends annual eye exams? [facility] If pt is not established with a provider, would they like to be referred to a provider to establish care? No .   Dental  Screening: Recommended annual dental exams for proper oral hygiene  Community Resource Referral / Chronic Care Management: CRR required this visit?  No   CCM required this visit?  No      Plan:     I have personally reviewed and noted the following in the patient's chart:   Medical and social history Use of alcohol, tobacco or illicit drugs  Current medications and supplements including opioid prescriptions.  Functional ability and status Nutritional status Physical activity Advanced directives List of other physicians Hospitalizations, surgeries, and ER visits in previous 12 months Vitals Screenings to include cognitive, depression, and falls Referrals and appointments  In addition, I have reviewed and discussed with patient certain preventive protocols, quality metrics, and best practice recommendations. A written personalized care plan for preventive services as well as general preventive health recommendations were provided to patient.   Due to this being a telephonic visit, the after visit summary with patients personalized plan was offered to patient via office  or my-chart. Patient preferred to pick up at office at next visit or mychart.   Andrez Grime, LPN   [phone]

## 2020-11-30 NOTE — Progress Notes (Signed)
PCP notes:  Health Maintenance: Shingrix- due Dexa- due    Abnormal Screenings: none   Patient concerns: Leg pain Dry eyes  Balance issues  Nurse concerns: none   Next PCP appt.: 12/07/2020 @ 2:30 pm

## 2020-12-07 ENCOUNTER — Encounter: Payer: Medicare (Managed Care) | Admitting: Family Medicine

## 2020-12-14 ENCOUNTER — Encounter: Payer: Self-pay | Admitting: Family Medicine

## 2020-12-14 ENCOUNTER — Ambulatory Visit (INDEPENDENT_AMBULATORY_CARE_PROVIDER_SITE_OTHER): Payer: Medicare HMO | Admitting: Family Medicine

## 2020-12-14 ENCOUNTER — Other Ambulatory Visit: Payer: Self-pay

## 2020-12-14 VITALS — BP 136/82 | HR 96 | Temp 97.8°F | Ht 63.0 in | Wt 198.0 lb

## 2020-12-14 DIAGNOSIS — I10 Essential (primary) hypertension: Secondary | ICD-10-CM

## 2020-12-14 DIAGNOSIS — Z0000 Encounter for general adult medical examination without abnormal findings: Secondary | ICD-10-CM

## 2020-12-14 DIAGNOSIS — Z7189 Other specified counseling: Secondary | ICD-10-CM

## 2020-12-14 DIAGNOSIS — R739 Hyperglycemia, unspecified: Secondary | ICD-10-CM

## 2020-12-14 DIAGNOSIS — M25569 Pain in unspecified knee: Secondary | ICD-10-CM

## 2020-12-14 DIAGNOSIS — E559 Vitamin D deficiency, unspecified: Secondary | ICD-10-CM | POA: Diagnosis not present

## 2020-12-14 DIAGNOSIS — Z1231 Encounter for screening mammogram for malignant neoplasm of breast: Secondary | ICD-10-CM

## 2020-12-14 DIAGNOSIS — Z780 Asymptomatic menopausal state: Secondary | ICD-10-CM

## 2020-12-14 MED ORDER — METOPROLOL SUCCINATE ER 50 MG PO TB24: 100.0000 mg | ORAL_TABLET | Freq: Every day | ORAL | Status: DC

## 2020-12-14 MED ORDER — TRAMADOL HCL 50 MG PO TABS: 50.0000 mg | ORAL_TABLET | Freq: Two times a day (BID) | ORAL | 1 refills | Status: DC | PRN

## 2020-12-14 NOTE — Progress Notes (Signed)
This visit occurred during the SARS-CoV-2 public health emergency.  Safety protocols were in place, including screening questions prior to the visit, additional usage of staff PPE, and extensive cleaning of exam room while observing appropriate contact time as indicated for disinfecting solutions.  Flu vaccine- to be done this fall.   covid vaccine prev done Tetanus 2014 PNA up to date shingrix d/w pt.   Mammogram- due, ordered 2022 DXA- due, ordered 2022 Advance directive d/w pt. Daughter [name] and son [name] equally designated if patient were incapacitated.  Diet and exercise d/w pt.   Colonoscopy 2019  She got married on 10/28/2020.  Doing well at home.  H/o DM2.  No meds.  Not diabetic by A1c.  Labs d/w pt.   Vit D def d/w pt.  See avs.  On replacement currently.  Balance changes. No falls.  She did home exercises for vertigo with some relief of that, and that felt different.  She can occ get lightheaded.  See exam.  Discussed with patient about options.  No chest pain.  Not short of breath.  Dry eyes.  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    Knee pain is variable, sometimes more than others.  Worse with weather changes.  She couldn't get tramadol prev, rx printed today.    Meds, vitals, and allergies reviewed.   ROS: Per HPI unless specifically indicated in ROS section   GEN: nad, alert and oriented HEENT: ncat NECK: supple w/o LA CV: rrr.   PULM: ctab, no inc wob ABD: soft, +bs EXT: no edema SKIN: no acute rash R knee ttp laterally with mild crepitus.   Medial joint line not tender.  No bruising.  Able to bear weight. Minimally lightheaded on standing, this self resolves.

## 2020-12-14 NOTE — Patient Instructions (Addendum)
Lab visit when done with higher dose of vitamin D.  Cut metoprolol back to 2 tabs a day and update me next week about your balance.   We'll call about the mammogram and bone density test.  Use tramadol as needed for knee pain.  Let me know if that helps.  Take care.  Glad to see you.  I would get a flu shot each fall.   Check with your insurance to see if they will cover the shingles shot.

## 2020-12-17 ENCOUNTER — Encounter: Payer: Self-pay | Admitting: Family Medicine

## 2020-12-17 NOTE — Assessment & Plan Note (Signed)
I question if her symptoms are related to her blood pressure.  Cut metoprolol back to 2 tabs a day and update me next week about balance, she agrees.

## 2020-12-17 NOTE — Assessment & Plan Note (Signed)
Advance directive d/w pt. Daughter [name] and son [name] equally designated if patient were incapacitated.  Diet and exercise d/w pt.

## 2020-12-17 NOTE — Assessment & Plan Note (Signed)
Flu vaccine- to be done this fall.   covid vaccine prev done Tetanus 2014 PNA up to date shingrix d/w pt.   Mammogram- due, ordered 2022 DXA- due, ordered 2022 Advance directive d/w pt. Daughter [name] and son [name] equally designated if patient were incapacitated.  Diet and exercise d/w pt.   Colonoscopy 2019

## 2020-12-17 NOTE — Assessment & Plan Note (Signed)
Tramadol prescription printed.  Routine tramadol cautions given to patient.  She will update me as needed.

## 2020-12-17 NOTE — Assessment & Plan Note (Signed)
Continue replacement Lab visit when done with higher dose of vitamin D.

## 2020-12-17 NOTE — Assessment & Plan Note (Signed)
o meds.  Not diabetic by A1c.  Labs d/w pt. continue work on diet and exercise.

## 2021-01-12 ENCOUNTER — Other Ambulatory Visit: Payer: Self-pay | Admitting: Family Medicine

## 2021-01-12 DIAGNOSIS — E2839 Other primary ovarian failure: Secondary | ICD-10-CM

## 2021-01-15 ENCOUNTER — Other Ambulatory Visit: Payer: Self-pay

## 2021-01-15 ENCOUNTER — Other Ambulatory Visit: Payer: Medicare HMO

## 2021-01-15 ENCOUNTER — Ambulatory Visit
Admission: RE | Admit: 2021-01-15 | Discharge: 2021-01-15 | Disposition: A | Discharge status: Home / Self Care | Payer: Medicare HMO | Source: Ambulatory Visit | Attending: Family Medicine | Admitting: Family Medicine

## 2021-01-15 DIAGNOSIS — E2839 Other primary ovarian failure: Secondary | ICD-10-CM

## 2021-01-17 ENCOUNTER — Ambulatory Visit: Payer: Medicare HMO

## 2021-01-21 ENCOUNTER — Other Ambulatory Visit: Payer: Self-pay | Admitting: Family Medicine

## 2021-01-23 MED ORDER — OMEPRAZOLE 40 MG PO CPDR: DELAYED_RELEASE_CAPSULE | ORAL | 3 refills | Status: DC

## 2021-01-23 NOTE — Addendum Note (Signed)
Addended by: Sherrilee Gilles B on: 01/23/2021 10:23 AM   Modules accepted: Orders

## 2021-01-25 ENCOUNTER — Telehealth: Payer: Self-pay | Admitting: Family Medicine

## 2021-01-25 NOTE — Telephone Encounter (Signed)
Pt returning call, would like to be called back

## 2021-01-25 NOTE — Telephone Encounter (Signed)
error 

## 2021-01-25 NOTE — Telephone Encounter (Signed)
Patient notified of lab results and verbalized understanding.  ° °

## 2021-02-20 ENCOUNTER — Ambulatory Visit
Admission: RE | Admit: 2021-02-20 | Discharge: 2021-02-20 | Disposition: A | Discharge status: Home / Self Care | Payer: Medicare HMO | Source: Ambulatory Visit | Attending: Family Medicine | Admitting: Family Medicine

## 2021-02-20 ENCOUNTER — Other Ambulatory Visit: Payer: Self-pay

## 2021-02-20 DIAGNOSIS — Z1231 Encounter for screening mammogram for malignant neoplasm of breast: Secondary | ICD-10-CM

## 2021-02-27 ENCOUNTER — Other Ambulatory Visit: Payer: Medicare HMO

## 2021-05-18 ENCOUNTER — Other Ambulatory Visit: Payer: Self-pay

## 2021-05-18 ENCOUNTER — Ambulatory Visit: Payer: Medicare PPO | Admitting: Family Medicine

## 2021-05-18 ENCOUNTER — Encounter: Payer: Self-pay | Admitting: *Deleted

## 2021-05-18 ENCOUNTER — Encounter: Payer: Self-pay | Admitting: Family Medicine

## 2021-05-18 VITALS — BP 150/70 | HR 80 | Temp 97.4°F | Ht 63.0 in | Wt 199.0 lb

## 2021-05-18 DIAGNOSIS — E559 Vitamin D deficiency, unspecified: Secondary | ICD-10-CM

## 2021-05-18 DIAGNOSIS — M25519 Pain in unspecified shoulder: Secondary | ICD-10-CM

## 2021-05-18 LAB — VITAMIN D 25 HYDROXY (VIT D DEFICIENCY, FRACTURES): VITD: 22.55 ng/mL — ABNORMAL LOW (ref 30.00–100.00)

## 2021-05-18 MED ORDER — TRAMADOL HCL 50 MG PO TABS: 50.0000 mg | ORAL_TABLET | Freq: Two times a day (BID) | ORAL | 1 refills | Status: DC | PRN

## 2021-05-18 MED ORDER — AMLODIPINE BESYLATE 10 MG PO TABS: 10.0000 mg | ORAL_TABLET | Freq: Every day | ORAL | 3 refills | Status: AC

## 2021-05-18 NOTE — Progress Notes (Signed)
This visit occurred during the SARS-CoV-2 public health emergency.  Safety protocols were in place, including screening questions prior to the visit, additional usage of staff PPE, and extensive cleaning of exam room while observing appropriate contact time as indicated for disinfecting solutions.  Leg and back pain. Lower back pain, midline and R lower back.  Not much on the L lower back. Can radiate down the R leg.  L leg less than R. Pain laying down.  Going on for about 1 year.  Tylenol helps a little.  No recent tramadol use.  No foot drop.    Some R shoulder and elbow pain.  No FCNAVD.  No rash.  No B/B incontinence.    H/o low vit D, on weekly replacement.  D/w pt about getting recheck lab done.    Needed amlodipine refill.  Done at [facility].    Meds, vitals, and allergies reviewed.   ROS: Per HPI unless specifically indicated in ROS section   Nad Ncat Neck supple, no LA Rrr Ctab Abdomen soft.  Nontender. Lower back not ttp in midline.  B SLR neg but R medial knee ttp, R lateral knee not ttp, able to bear weight.  Right hip with pain on internal rotation. Right shoulder with pain on overhead movement.  Pain with internal and external rotation.  External rotation improved with scapular manipulation.  30 minutes were devoted to patient care in this encounter (this includes time spent reviewing the patient's file/history, interviewing and examining the patient, counseling/reviewing plan with patient).

## 2021-05-18 NOTE — Patient Instructions (Addendum)
Go to the lab on the way out.   If you have mychart we'll likely use that to update you.    Take care.  Glad to see you.  We'll call about seeing PT.   I would try tramadol in the meantime.

## 2021-05-20 ENCOUNTER — Other Ambulatory Visit: Payer: Self-pay | Admitting: Family Medicine

## 2021-05-20 DIAGNOSIS — E559 Vitamin D deficiency, unspecified: Secondary | ICD-10-CM

## 2021-05-20 NOTE — Assessment & Plan Note (Signed)
See notes on labs. 

## 2021-05-20 NOTE — Assessment & Plan Note (Signed)
She likely has rotator cuff symptoms, anatomy discussed with patient.  She also has right hip pain on internal rotation and she has medial knee pain.  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  Discussed.  Reasonable to see PT for all of that.  We can refer to orthopedics later on if needed.  Reasonable to use tramadol if needed for pain in the meantime.  Routine tramadol cautions discussed with patient.  She agrees with plan.

## 2021-05-23 ENCOUNTER — Other Ambulatory Visit: Payer: Self-pay | Admitting: Family Medicine

## 2021-05-23 DIAGNOSIS — E559 Vitamin D deficiency, unspecified: Secondary | ICD-10-CM

## 2021-05-24 ENCOUNTER — Other Ambulatory Visit: Payer: Self-pay | Admitting: Family Medicine

## 2021-05-24 MED ORDER — VITAMIN D (ERGOCALCIFEROL) 1.25 MG (50000 UNIT) PO CAPS: 50000.0000 [IU] | ORAL_CAPSULE | ORAL | 0 refills | Status: DC

## 2021-06-12 ENCOUNTER — Ambulatory Visit: Payer: Medicare PPO | Attending: Family Medicine

## 2021-06-12 DIAGNOSIS — M25561 Pain in right knee: Secondary | ICD-10-CM | POA: Insufficient documentation

## 2021-06-12 DIAGNOSIS — G8929 Other chronic pain: Secondary | ICD-10-CM | POA: Insufficient documentation

## 2021-06-12 DIAGNOSIS — M25511 Pain in right shoulder: Secondary | ICD-10-CM | POA: Insufficient documentation

## 2021-06-12 DIAGNOSIS — M6281 Muscle weakness (generalized): Secondary | ICD-10-CM | POA: Insufficient documentation

## 2021-06-12 NOTE — Therapy (Incomplete)
OUTPATIENT PHYSICAL THERAPY SHOULDER EVALUATION   Patient Name: Cassandra Cooke MRN: [id number] DOB:1940-07-30, 81 y.o., female Today's Date: 06/12/2021    Past Medical History:  Diagnosis Date   Allergy    Anxiety    during a period of social upheaval   Arthritis    Bloating    Cataract    Colon polyps    Diabetes mellitus type II    Borderline   Diverticulosis of colon    GERD (gastroesophageal reflux disease)    Heartburn    Hiatal hernia    Hypertension    Vitamin D deficiency    Past Surgical History:  Procedure Laterality Date   CATARACT EXTRACTION     Bil   COLONOSCOPY     DILATION AND CURETTAGE OF UTERUS     1 miscarriage   DILATION AND CURETTAGE OF UTERUS  01/2008   vaginal bleeding (Dr. Ronita Hipps)   [place]   right due to cysts   OOPHORECTOMY  1971   ovarian cystectomy left   POLYPECTOMY     VAGINAL DELIVERY     NSVD x 5   Patient Active Problem List   Diagnosis Date Noted   Joint pain 07/04/2019   Shoulder pain 12/30/2018   Vertigo 10/11/2018   Hyperglycemia 06/03/2018   Vaginal dryness 06/03/2018   Foot pain 09/01/2017   Healthcare maintenance 08/09/2016   Pain in knee joint 01/13/2015   Osteopenia 01/16/2014   Medicare annual wellness visit, subsequent 01/10/2014   Advance care planning 01/10/2014   Vitamin D deficiency 06/14/2010   GERD 03/15/2009   UNSPECIFIED MENOPAUSAL&POSTMENOPAUSAL DISORDER 06/01/2008   ANKLE EDEMA, CHRONIC 03/15/2008   LEG CRAMPS 11/18/2006   HYPERCHOLESTEROLEMIA 10/07/2006   OBESITY 10/07/2006   Essential hypertension 10/07/2006   DIVERTICULOSIS, COLON 10/07/2006   CONSTIPATION 10/07/2006    PCP: Tonia Ghent, MD  REFERRING PROVIDER: Tonia Ghent, MD  REFERRING DIAG: Shoulder pain, unspecified chronicity, unspecified laterality  THERAPY DIAG:  No diagnosis found.   ONSET DATE: ***  SUBJECTIVE:                                                                                                                                                                                       SUBJECTIVE STATEMENT: ***  PERTINENT HISTORY: ***  PAIN:  Are you having pain? {yes/no:20286} NPRS scale: ***/10 Pain location: *** Pain orientation: {Pain Orientation:25161}  PAIN TYPE: {type:313116} Pain description: {PAIN DESCRIPTION:21022940}  Aggravating factors: *** Relieving factors: ***  PRECAUTIONS: {Therapy precautions:24002}  WEIGHT BEARING RESTRICTIONS {Yes ***/No:24003}  FALLS:  Has patient fallen in last 6 months? {yes/no:20286} Number of falls: ***  LIVING ENVIRONMENT: Lives  with: {[facility] lives with:25569::"lives with their family"} Lives in: {Lives in:25570} Stairs: {yes/no:20286}; {Stairs:24000} Has following equipment at home: {Assistive devices:23999}  OCCUPATION: ***  PLOF: {PLOF:24004}  PATIENT GOALS ***  OBJECTIVE:   DIAGNOSTIC FINDINGS:   L shoulder Xray-12/30/18 IMPRESSION: Moderate degenerative changes in the left shoulder without acute osseous abnormality.  PATIENT SURVEYS:  {rehab surveys:24030:a}  COGNITION:  Overall cognitive status: {cognition:24006}     SENSATION:  Light touch: {intact/deficits:24005}  Stereognosis: {intact/deficits:24005}  Hot/Cold: {intact/deficits:24005}  Proprioception: {intact/deficits:24005}  POSTURE: ***  PALPATION: ***  UPPER EXTREMITY AROM/PROM:  A/PROM Right 06/12/2021 Left 06/12/2021  Shoulder flexion    Shoulder extension    Shoulder abduction    Shoulder adduction    Shoulder internal rotation    Shoulder external rotation    Elbow flexion    Elbow extension    Wrist flexion    Wrist extension    Wrist ulnar deviation    Wrist radial deviation    Wrist pronation    Wrist supination    (Blank rows = not tested)  UPPER EXTREMITY MMT:  MMT Right 06/12/2021 Left 06/12/2021  Shoulder flexion    Shoulder extension    Shoulder abduction    Shoulder adduction    Shoulder internal rotation     Shoulder external rotation    Middle trapezius    Lower trapezius    Elbow flexion    Elbow extension    Wrist flexion    Wrist extension    Wrist ulnar deviation    Wrist radial deviation    Wrist pronation    Wrist supination    Grip strength (lbs)    (Blank rows = not tested)  SHOULDER SPECIAL TESTS:  Impingement tests: {shoulder impingement test:25231:a}  SLAP lesions: {SLAP lesions:25232}  Instability tests: {shoulder instability test:25233}  Rotator cuff assessment: {rotator cuff assessment:25234}  Biceps assessment: {biceps assessment:25235}  JOINT MOBILITY TESTING:  ***  PALPATION:  ***  POSTURE:  ***   TODAY'S TREATMENT:  ***   PATIENT EDUCATION: Education details: *** Person educated: {Person educated:25204} Education method: {Education Method:25205} Education comprehension: {Education Comprehension:25206}   HOME EXERCISE PROGRAM: ***  ASSESSMENT:  CLINICAL IMPRESSION: Patient is a *** y.o. *** who was seen today for physical therapy evaluation and treatment for ***. Objective impairments include {opptimpairments:25111}. These impairments are limiting patient from {activity limitations:25113}. Personal factors including {Personal factors:25162} are also affecting patient's functional outcome. Patient will benefit from skilled PT to address above impairments and improve overall function.  REHAB POTENTIAL: {rehabpotential:25112}  CLINICAL DECISION MAKING: {clinical decision making:25114}  EVALUATION COMPLEXITY: {Evaluation complexity:25115}   GOALS: Goals reviewed with patient? {yes/no:20286}  SHORT TERM GOALS:  STG Name Target Date Goal status  1 *** Baseline:  {follow up:25551} {GOALSTATUS:25110}  2 *** Baseline:  {follow up:25551} {GOALSTATUS:25110}  3 *** Baseline: {follow up:25551} {GOALSTATUS:25110}  4 *** Baseline: {follow up:25551} {GOALSTATUS:25110}  5 *** Baseline: {follow up:25551} {GOALSTATUS:25110}  6 *** Baseline:  {follow up:25551} {GOALSTATUS:25110}  7 *** Baseline: {follow up:25551} {GOALSTATUS:25110}   LONG TERM GOALS:   LTG Name Target Date Goal status  1 *** Baseline: {follow up:25551} {GOALSTATUS:25110}  2 *** Baseline: {follow up:25551} {GOALSTATUS:25110}  3 *** Baseline: {follow up:25551} {GOALSTATUS:25110}  4 *** Baseline: {follow up:25551} {GOALSTATUS:25110}  5 *** Baseline: {follow up:25551} {GOALSTATUS:25110}  6 *** Baseline: {follow up:25551} {GOALSTATUS:25110}  7 *** Baseline: {follow up:25551} {GOALSTATUS:25110}   PLAN: PT FREQUENCY: {rehab frequency:25116}  PT DURATION: {rehab duration:25117}  PLANNED INTERVENTIONS: {rehab planned interventions:25118::"Therapeutic exercises","Therapeutic activity","Neuro Muscular re-education","Balance training","Gait training","Patient/Family education","Joint  mobilization"}  PLAN FOR NEXT SESSION: ***   Gar Ponto, PT 06/12/2021, 6:12 AM

## 2021-06-20 NOTE — Therapy (Signed)
OUTPATIENT PHYSICAL THERAPY SHOULDER EVALUATION   Patient Name: Cassandra Cooke MRN: [id number] DOB:[birth year], 81 y.o., female Today's Date: 06/21/2021   PT End of Session - 06/21/21 1042     Visit Number 1    Number of Visits 17    Date for PT Re-Evaluation 08/16/21    Authorization Type [employer] MCR    Authorization Time Period FOTO v6 and v10, KX mod at v15    Progress Note Due on Visit 10    PT Start Time 1012   Pt arrived 10 minutes late to her appointment.   PT Stop Time 1045    PT Time Calculation (min) 33 min    Activity Tolerance Patient tolerated treatment well    Behavior During Therapy WFL for tasks assessed/performed             Past Medical History:  Diagnosis Date   Allergy    Anxiety    during a period of social upheaval   Arthritis    Bloating    Cataract    Colon polyps    Diabetes mellitus type II    Borderline   Diverticulosis of colon    GERD (gastroesophageal reflux disease)    Heartburn    Hiatal hernia    Hypertension    Vitamin D deficiency    Past Surgical History:  Procedure Laterality Date   CATARACT EXTRACTION     Bil   COLONOSCOPY     DILATION AND CURETTAGE OF UTERUS     1 miscarriage   DILATION AND CURETTAGE OF UTERUS  01/2008   vaginal bleeding (Dr. Ronita Hipps)   [place]   right due to cysts   OOPHORECTOMY  1971   ovarian cystectomy left   POLYPECTOMY     VAGINAL DELIVERY     NSVD x 5   Patient Active Problem List   Diagnosis Date Noted   Joint pain 07/04/2019   Shoulder pain 12/30/2018   Vertigo 10/11/2018   Hyperglycemia 06/03/2018   Vaginal dryness 06/03/2018   Foot pain 09/01/2017   Healthcare maintenance 08/09/2016   Pain in knee joint 01/13/2015   Osteopenia 01/16/2014   Medicare annual wellness visit, subsequent 01/10/2014   Advance care planning 01/10/2014   Vitamin D deficiency 06/14/2010   GERD 03/15/2009   UNSPECIFIED MENOPAUSAL&POSTMENOPAUSAL DISORDER 06/01/2008   ANKLE EDEMA, CHRONIC  03/15/2008   LEG CRAMPS 11/18/2006   HYPERCHOLESTEROLEMIA 10/07/2006   OBESITY 10/07/2006   Essential hypertension 10/07/2006   DIVERTICULOSIS, COLON 10/07/2006   CONSTIPATION 10/07/2006    PCP: Tonia Ghent, MD  REFERRING PROVIDER: Tonia Ghent, MD  REFERRING DIAG: M25.519 (ICD-10-CM) - Shoulder pain, unspecified chronicity, unspecified laterality  THERAPY DIAG:  Chronic right shoulder pain  Muscle weakness (generalized)  Chronic pain of right knee   ONSET DATE: 2 years ago  SUBJECTIVE:  SUBJECTIVE STATEMENT: Pt reports primary c/o Rt shoulder pain of insidious onset lasting about 1 year. She reports painful popping and clicking in the Rt shoulder when reaching overhead. She also reports limited ability to reach overhead or behind her. Aggravating factors for the shoulder pain include laying on it, reaching overhead, or lifting > 5 pounds. Easing factors include pain cream and pain medication. Pt also reports chronic Rt knee pain lasting about 3 years after she went on a 30 minute walk in 2020, during which she first experienced Rt knee pain. She reports that she has Rt knee buckling when walking, which occurs every day. She denies any clicking or popping in the knee. However, she reports severe stiffness in the Rt knee in the mornings. Aggravating factors for her knee pain include walking > 1 hour, prolonged standing >15 minutes. Easing factors include rest and heating pad. Current pain is 5/10 (Rt knee), 8/10 (Rt shoulder). Worst pain is 10/10. Best pain is 3-4/10. Pt denies any unexplained weight change, changes in bowel/ bladder function, unrelenting night pain, and nausea/ vomiting.  PERTINENT HISTORY: HTN, DMII, anxiety  PAIN:  Are you having pain? Yes NPRS scale: 5/10 (Rt knee), 8/10 (Rt  shoulder) PAIN TYPE: aching and sharp Pain description: intermittent  Aggravating factors: knee: walking > 1 hour, prolonged standing >15 minutes. Shoulder: laying on it, reaching overhead, or lifting > 5 pounds Relieving factors: rest, pain cream, pain medication, heating pad  PRECAUTIONS: None  WEIGHT BEARING RESTRICTIONS No  FALLS:  Has patient fallen in last 6 months? Yes Number of falls: 1 (two weeks ago after knee buckled, no injury)  LIVING ENVIRONMENT: Lives with: lives with their family Lives in: House/apartment Stairs: No;  Has following equipment at home: Single point cane  OCCUPATION: Retired  PLOF: [place] Do yard work, Neurosurgeon, household ADLs  OBJECTIVE:   DIAGNOSTIC FINDINGS:  None related to current problem  PATIENT SURVEYS:  FOTO : Administer at first treatment  COGNITION:  Overall cognitive status: Within functional limits for tasks assessed     SENSATION:  Light touch: Appears intact  POSTURE: Forward head and rounded shoulders  UPPER EXTREMITY AROM/PROM:  A/PROM Right 06/21/2021 Left 06/21/2021  Shoulder flexion 90p! AROM WNL  Shoulder abduction 85p! AROM WNL  Shoulder internal rotation 15p! AROM WNL  Shoulder external rotation 30p! AROM WNL  (Blank rows = not tested)  UPPER EXTREMITY MMT:  MMT Right 06/21/2021 Left 06/21/2021  Shoulder flexion 3+/5p! 4/5  Shoulder abduction 3/5p! 4+/5  Shoulder internal rotation 4/5p! 5/5  Shoulder external rotation 3+/5p! 5/5  Middle trapezius 2+/5 3+/5  Lower trapezius 2+/5 3+/5  Elbow flexion 5/5 5/5  Elbow extension 5/5 5/5  Grip strength (lbs)    (Blank rows = not tested)  SHOULDER SPECIAL TESTS:  -Hawkins-Kennedy: (+) on Rt  -Neer's: (+) on Rt  -ERLS at 90d: (-) on Rt  JOINT MOBILITY TESTING:  GHJ: WNL all planes, minor pain with AP mobilization  PALPATION:  TTP to Rt supraspinatus and infraspinatus musculotendinous junction and subacromial  space   TODAY'S TREATMENT:  06/21/2021: Issued HEP   PATIENT EDUCATION: Education details: Educated on probable underlying pathophysiology behind her shoulder pain, POC, prognosis, and HEP Person educated: Patient Education method: Consulting civil engineer, Media planner, and Handouts Education comprehension: verbalized understanding and returned demonstration   HOME EXERCISE PROGRAM: Access Code: [email or web address] URL: [email or web address] Date: 06/21/2021 Prepared by: Vanessa [place]  Exercises Shoulder External Rotation and Scapular Retraction with Resistance - 1 x daily -  7 x weekly - 3 sets - 10 reps - 3-sec hold Standing Shoulder Row with Anchored Resistance - 1 x daily - 7 x weekly - 3 sets - 10 reps - 3-sec hold Shoulder Scaption AAROM with Dowel - 1 x daily - 7 x weekly - 3 sets - 10 reps - 5-sec hold   ASSESSMENT:  CLINICAL IMPRESSION: Due to pt arriving 10 minutes late to her evaluation, the session was truncated today. Patient is a 81 y.o. F who was seen today for physical therapy evaluation and treatment for chronic Rt shoulder and knee pain. Due to time constraints, the focus of today's eval was the shoulder since it presents the problem with the highest pain levels and longest chronicity of the two problems. Upon assessment, the pt's impairments include painful and limited global Rt shoulder AROM, painful and weak Rt shoulder MMT, weak BIL parascapular MMT, and TTP to Rt supraspinatus and infraspinatus musculotendinous junction and subacromial space. Ruling up Rt shoulder rotator cuff pathology due to painful and limited abduction, flexion, ER, and IR MMT and AROM, as well as positive Hawkins-Kennedy and Neer's special testing. The pt will continue to benefit from skilled PT to address her primary impairments and return to her prior level of function with less limitation.   OBJECTIVE IMPAIRMENTS Abnormal gait, decreased balance, decreased mobility, difficulty walking,  decreased ROM, decreased strength, impaired flexibility, impaired UE functional use, postural dysfunction, and pain.   ACTIVITY LIMITATIONS cleaning, community activity, laundry, yard work, shopping, and church.   PERSONAL FACTORS 3+ comorbidities: HTN, DMII, anxiety  are also affecting patient's functional outcome.    REHAB POTENTIAL: Fair Due to chronicity of symptoms and multiple problems areas  CLINICAL DECISION MAKING: Evolving/moderate complexity  EVALUATION COMPLEXITY: Moderate   GOALS: Goals reviewed with patient? Yes  SHORT TERM GOALS:  STG Name Target Date Goal status  1 Pt will report understanding and adherence to her HEP in order to promote independence in the management of her primary impairments. Baseline:  HEP provided at eval 07/19/2021 INITIAL   LONG TERM GOALS:   LTG Name Target Date Goal status  1 Pt will achieve a FOTO score of ___ in order to demonstrate improved functional ability as it relates to her shoulder pain. Baseline: Administer FOTO at first treatment session 08/16/2021 INITIAL  2 Pt will achieve Rt shoulder flexion/ abduction AROM of 150 degrees or greater in order to put away dishes in overhead cabinets. Baseline: Flexion = 90d, abduction = 85 degrees 08/16/2021 INITIAL  3 Pt will achieve global Rt shoulder strength of 4+/5 or greater in order to perform yard work with less limitation. Baseline: See MMT chart 08/16/2021 INITIAL  4 Pt will report ability to stand >30 minutes in order to fold clothes without limitation due to knee pain. Baseline: Pt requires seated break after 15 minutes of standing due to knee pain. 08/16/2021 INITIAL   PLAN: PT FREQUENCY: 2x/week  PT DURATION: 8 weeks  PLANNED INTERVENTIONS: Therapeutic exercises, Therapeutic activity, Neuro Muscular re-education, Balance training, Gait training, Patient/Family education, Joint mobilization, Dry Needling, Electrical stimulation, Cryotherapy, Moist heat, Taping, Vasopneumatic  device, and Manual therapy  PLAN FOR NEXT SESSION: Assess knee, balance assessment, administer FOTO, progress Rt shoulder RTC strengthening as able   Vanessa Monroe, PT, DPT 06/21/21 11:18 AM

## 2021-06-21 ENCOUNTER — Ambulatory Visit: Payer: Medicare PPO

## 2021-06-21 ENCOUNTER — Other Ambulatory Visit: Payer: Self-pay

## 2021-06-21 DIAGNOSIS — M25511 Pain in right shoulder: Secondary | ICD-10-CM

## 2021-06-21 DIAGNOSIS — G8929 Other chronic pain: Secondary | ICD-10-CM

## 2021-06-21 DIAGNOSIS — M25561 Pain in right knee: Secondary | ICD-10-CM | POA: Diagnosis not present

## 2021-06-21 DIAGNOSIS — M6281 Muscle weakness (generalized): Secondary | ICD-10-CM

## 2021-06-22 NOTE — Progress Notes (Signed)
Agree.  Thanks.   Physician Documentation  Your signature is required to indicate approval of the treatment plan as stated above. By signing this report, you are approving the plan of care. Please sign and either send electronically or print and fax the signed copy to the number below. If you approve with modifications, please indicate those in the space provided.  Physician Signature: Elsie Stain Date: 06/22/21 Time: 1:21 PM

## 2021-06-28 ENCOUNTER — Other Ambulatory Visit: Payer: Self-pay

## 2021-06-28 ENCOUNTER — Ambulatory Visit: Payer: Medicare PPO

## 2021-06-28 DIAGNOSIS — M25511 Pain in right shoulder: Secondary | ICD-10-CM

## 2021-06-28 DIAGNOSIS — M6281 Muscle weakness (generalized): Secondary | ICD-10-CM

## 2021-06-28 DIAGNOSIS — G8929 Other chronic pain: Secondary | ICD-10-CM

## 2021-06-28 DIAGNOSIS — M25561 Pain in right knee: Secondary | ICD-10-CM

## 2021-06-28 NOTE — Therapy (Signed)
OUTPATIENT PHYSICAL THERAPY TREATMENT NOTE   Patient Name: Cassandra Cooke MRN: [id number] DOB:1940-07-22, 81 y.o., female Today's Date: 06/28/2021  PCP: Tonia Ghent, MD REFERRING PROVIDER: Tonia Ghent, MD   PT End of Session - 06/28/21 1220     Visit Number 2    Number of Visits 17    Date for PT Re-Evaluation 08/16/21    Authorization Type [employer] MCR    Authorization Time Period FOTO v6 and v10, KX mod at v15    Progress Note Due on Visit 10    PT Start Time 1214    PT Stop Time 1259    PT Time Calculation (min) 45 min    Activity Tolerance Patient tolerated treatment well    Behavior During Therapy WFL for tasks assessed/performed             Past Medical History:  Diagnosis Date   Allergy    Anxiety    during a period of social upheaval   Arthritis    Bloating    Cataract    Colon polyps    Diabetes mellitus type II    Borderline   Diverticulosis of colon    GERD (gastroesophageal reflux disease)    Heartburn    Hiatal hernia    Hypertension    Vitamin D deficiency    Past Surgical History:  Procedure Laterality Date   CATARACT EXTRACTION     Bil   COLONOSCOPY     DILATION AND CURETTAGE OF UTERUS     1 miscarriage   DILATION AND CURETTAGE OF UTERUS  01/2008   vaginal bleeding (Dr. Ronita Hipps)   [place]   right due to cysts   OOPHORECTOMY  1971   ovarian cystectomy left   POLYPECTOMY     VAGINAL DELIVERY     NSVD x 5   Patient Active Problem List   Diagnosis Date Noted   Joint pain 07/04/2019   Shoulder pain 12/30/2018   Vertigo 10/11/2018   Hyperglycemia 06/03/2018   Vaginal dryness 06/03/2018   Foot pain 09/01/2017   Healthcare maintenance 08/09/2016   Pain in knee joint 01/13/2015   Osteopenia 01/16/2014   Medicare annual wellness visit, subsequent 01/10/2014   Advance care planning 01/10/2014   Vitamin D deficiency 06/14/2010   GERD 03/15/2009   UNSPECIFIED MENOPAUSAL&POSTMENOPAUSAL DISORDER 06/01/2008   ANKLE  EDEMA, CHRONIC 03/15/2008   LEG CRAMPS 11/18/2006   HYPERCHOLESTEROLEMIA 10/07/2006   OBESITY 10/07/2006   Essential hypertension 10/07/2006   DIVERTICULOSIS, COLON 10/07/2006   CONSTIPATION 10/07/2006    REFERRING DIAG: M25.519 (ICD-10-CM) - Shoulder pain, unspecified chronicity, unspecified laterality  THERAPY DIAG:  Chronic right shoulder pain  Muscle weakness (generalized)  Chronic pain of right knee  PERTINENT HISTORY: HTN, DMII, anxiety  PRECAUTIONS: None  ONSET DATE: 2 years ago  SUBJECTIVE: My knee is acting up today but my shoulder feels a bit better.   PAIN:  Are you having pain? Yes NPRS scale: 8/10 (Rt knee), 7/10 (Rt shoulder) PAIN TYPE: aching and sharp Pain description: intermittent  Aggravating factors: knee: walking > 1 hour, prolonged standing >15 minutes. Shoulder: laying on it, reaching overhead, or lifting > 5 pounds Relieving factors: rest, pain cream, pain medication, heating pad    OBJECTIVE:    DIAGNOSTIC FINDINGS:  None related to current problem   PATIENT SURVEYS:  FOTO : Administer at first treatment   COGNITION:          Overall cognitive status: Within functional limits for  tasks assessed                               SENSATION:          Light touch: Appears intact   POSTURE: Forward head and rounded shoulders   UPPER EXTREMITY AROM/PROM:   A/PROM Right 06/21/2021 Left 06/21/2021  Shoulder flexion 90p! AROM WNL  Shoulder abduction 85p! AROM WNL  Shoulder internal rotation 15p! AROM WNL  Shoulder external rotation 30p! AROM WNL  (Blank rows = not tested)   UPPER EXTREMITY MMT:   MMT Right 06/21/2021 Left 06/21/2021  Shoulder flexion 3+/5p! 4/5  Shoulder abduction 3/5p! 4+/5  Shoulder internal rotation 4/5p! 5/5  Shoulder external rotation 3+/5p! 5/5  Middle trapezius 2+/5 3+/5  Lower trapezius 2+/5 3+/5  Elbow flexion 5/5 5/5  Elbow extension 5/5 5/5  Grip strength (lbs)      (Blank rows = not tested)   SHOULDER  SPECIAL TESTS:           -Hawkins-Kennedy: (+) on Rt           -Neer's: (+) on Rt           -ERLS at 90d: (-) on Rt   JOINT MOBILITY TESTING:  GHJ: WNL all planes, minor pain with AP mobilization   PALPATION:  TTP to Rt supraspinatus and infraspinatus musculotendinous junction and subacromial space  FOTO 06/28/2021: 41%  BERG 06/28/2021: BERG BALANCE TEST Sitting to Standing: 3.      Stands independently using hands Standing Unsupported: 4.      Stands safely for 2 minutes Sitting Unsupported: 4.     Sits for 2 minutes independently Standing to Sitting: 3.     Controls descent with hands  Transfers: 1.     Needs 1 person assist Standing with eyes closed: 3.     Stands 10 seconds with supervision Standing with feet together: 3.     Stands for 1 minute with supervision Reaching forward with outstretched arm: 4.     Reaches forward 10 inches Retrieving object from the floor: 4.      Able to pick up easily and safely Turning to look behind: 2.     Turns sideways only, maintains balance Turning 360 degrees: 3.     Able to turn on one side in </= 4 seconds Place alternate foot on stool: 3.     Completes 8 steps in >20 seconds Standing with one foot in front: 1.     Needs help to step, but can hold for 15 seconds Standing on one foot: 3.     Holds 5-10 seconds  Total Score: 41/56             TODAY'S TREATMENT:  [facility] Adult PT Treatment:                                                DATE: 06/28/2021 Therapeutic Exercise: Rows RTB x 10 ER/IR RTB Rt x 10 each Shoulder extension RTB x 10 Seated horizontal abduction RTB 2 x 10 Seated ER RTB 2 x 10 Seated diagonals RTB x 10 (terminated on R due to increase in pain) STS x 10 Lifting 2# into cabinet x 10 Therapeutic Activity: BERG assessment   06/21/2021: Issued HEP     PATIENT EDUCATION: Education details:  Educated on probable underlying pathophysiology behind her shoulder pain, POC, prognosis, and HEP Person educated:  Patient Education method: Explanation, Demonstration, and Handouts Education comprehension: verbalized understanding and returned demonstration     HOME EXERCISE PROGRAM: Access Code: [email or web address] URL: [email or web address] Date: 06/21/2021 Prepared by: Vanessa [place]   Exercises Shoulder External Rotation and Scapular Retraction with Resistance - 1 x daily - 7 x weekly - 3 sets - 10 reps - 3-sec hold Standing Shoulder Row with Anchored Resistance - 1 x daily - 7 x weekly - 3 sets - 10 reps - 3-sec hold Shoulder Scaption AAROM with Dowel - 1 x daily - 7 x weekly - 3 sets - 10 reps - 5-sec hold     ASSESSMENT:   CLINICAL IMPRESSION: Patient presents to PT with R shoulder and R knee pain. Completed FOTO and BERG balance assessment this session. She reports HEP compliance. Focus of session on R shoulder strengthening. She was able to complete all prescribed exercises with minimal increase in pain throughout session. She reported mild dizziness at end of session that dissipated after a few minutes and a cup of water, she states this doesn't happen often but does occur occasionally. Patient continues to benefit from skilled PT services and should be progressed as able to improve functional independence.     OBJECTIVE IMPAIRMENTS Abnormal gait, decreased balance, decreased mobility, difficulty walking, decreased ROM, decreased strength, impaired flexibility, impaired UE functional use, postural dysfunction, and pain.    ACTIVITY LIMITATIONS cleaning, community activity, laundry, yard work, shopping, and church.    PERSONAL FACTORS 3+ comorbidities: HTN, DMII, anxiety  are also affecting patient's functional outcome.      REHAB POTENTIAL: Fair Due to chronicity of symptoms and multiple problems areas   CLINICAL DECISION MAKING: Evolving/moderate complexity   EVALUATION COMPLEXITY: Moderate     GOALS: Goals reviewed with patient? Yes   SHORT TERM GOALS:   STG Name  Target Date Goal status  1 Pt will report understanding and adherence to her HEP in order to promote independence in the management of her primary impairments. Baseline:  HEP provided at eval 07/19/2021 INITIAL    LONG TERM GOALS:    LTG Name Target Date Goal status  1 Pt will achieve a FOTO score of ___ in order to demonstrate improved functional ability as it relates to her shoulder pain. Baseline: Administer FOTO at first treatment session 41% 08/16/2021 INITIAL  2 Pt will achieve Rt shoulder flexion/ abduction AROM of 150 degrees or greater in order to put away dishes in overhead cabinets. Baseline: Flexion = 90d, abduction = 85 degrees 08/16/2021 INITIAL  3 Pt will achieve global Rt shoulder strength of 4+/5 or greater in order to perform yard work with less limitation. Baseline: See MMT chart 08/16/2021 INITIAL  4 Pt will report ability to stand >30 minutes in order to fold clothes without limitation due to knee pain. Baseline: Pt requires seated break after 15 minutes of standing due to knee pain. 08/16/2021 INITIAL    PLAN: PT FREQUENCY: 2x/week   PT DURATION: 8 weeks   PLANNED INTERVENTIONS: Therapeutic exercises, Therapeutic activity, Neuro Muscular re-education, Balance training, Gait training, Patient/Family education, Joint mobilization, Dry Needling, Electrical stimulation, Cryotherapy, Moist heat, Taping, Vasopneumatic device, and Manual therapy   PLAN FOR NEXT SESSION: Assess knee, balance training, progress Rt shoulder RTC strengthening as able    Evelene Croon, PTA 06/28/2021, 12:20 PM

## 2021-07-03 NOTE — Therapy (Signed)
OUTPATIENT PHYSICAL THERAPY TREATMENT NOTE   Patient Name: Cassandra Cooke MRN: [id number] DOB:Mar 11, 1941, 81 y.o., female Today's Date: 07/04/2021  PCP: Tonia Ghent, MD REFERRING PROVIDER: Tonia Ghent, MD   PT End of Session - 07/04/21 1107     Visit Number 3    Number of Visits 17    Date for PT Re-Evaluation 08/16/21    Authorization Type [employer] MCR    Authorization Time Period FOTO v6 and v10, KX mod at v15    Progress Note Due on Visit 10    PT Start Time 1105   Pt arrived 20 minutes late to her appointment.   PT Stop Time 1130    PT Time Calculation (min) 25 min    Activity Tolerance Patient tolerated treatment well    Behavior During Therapy WFL for tasks assessed/performed              Past Medical History:  Diagnosis Date   Allergy    Anxiety    during a period of social upheaval   Arthritis    Bloating    Cataract    Colon polyps    Diabetes mellitus type II    Borderline   Diverticulosis of colon    GERD (gastroesophageal reflux disease)    Heartburn    Hiatal hernia    Hypertension    Vitamin D deficiency    Past Surgical History:  Procedure Laterality Date   CATARACT EXTRACTION     Bil   COLONOSCOPY     DILATION AND CURETTAGE OF UTERUS     1 miscarriage   DILATION AND CURETTAGE OF UTERUS  01/2008   vaginal bleeding (Dr. Ronita Hipps)   [place]   right due to cysts   OOPHORECTOMY  1971   ovarian cystectomy left   POLYPECTOMY     VAGINAL DELIVERY     NSVD x 5   Patient Active Problem List   Diagnosis Date Noted   Joint pain 07/04/2019   Shoulder pain 12/30/2018   Vertigo 10/11/2018   Hyperglycemia 06/03/2018   Vaginal dryness 06/03/2018   Foot pain 09/01/2017   Healthcare maintenance 08/09/2016   Pain in knee joint 01/13/2015   Osteopenia 01/16/2014   Medicare annual wellness visit, subsequent 01/10/2014   Advance care planning 01/10/2014   Vitamin D deficiency 06/14/2010   GERD 03/15/2009   UNSPECIFIED  MENOPAUSAL&POSTMENOPAUSAL DISORDER 06/01/2008   ANKLE EDEMA, CHRONIC 03/15/2008   LEG CRAMPS 11/18/2006   HYPERCHOLESTEROLEMIA 10/07/2006   OBESITY 10/07/2006   Essential hypertension 10/07/2006   DIVERTICULOSIS, COLON 10/07/2006   CONSTIPATION 10/07/2006    REFERRING DIAG: M25.519 (ICD-10-CM) - Shoulder pain, unspecified chronicity, unspecified laterality  THERAPY DIAG:  Chronic right shoulder pain  Muscle weakness (generalized)  Chronic pain of right knee  PERTINENT HISTORY: HTN, DMII, anxiety  PRECAUTIONS: None  ONSET DATE: 2 years ago  SUBJECTIVE: Pt reports continued baseline Rt shoulder and Rt knee pain today. She reports varied adherence to her HEP, doing her exercises a couple of times per week.  PAIN:  Are you having pain? Yes NPRS scale: 5/10 (Rt knee), 5/10 (Rt shoulder) PAIN TYPE: aching and sharp Pain description: intermittent  Aggravating factors: knee: walking > 1 hour, prolonged standing >15 minutes. Shoulder: laying on it, reaching overhead, or lifting > 5 pounds Relieving factors: rest, pain cream, pain medication, heating pad    OBJECTIVE:    DIAGNOSTIC FINDINGS:  None related to current problem   PATIENT SURVEYS:  FOTO :  Administer at first treatment   COGNITION:          Overall cognitive status: Within functional limits for tasks assessed                               SENSATION:          Light touch: Appears intact   POSTURE: Forward head and rounded shoulders   UPPER EXTREMITY AROM/PROM:   A/PROM Right 06/21/2021 Left 06/21/2021  Shoulder flexion 90p! AROM WNL  Shoulder abduction 85p! AROM WNL  Shoulder internal rotation 15p! AROM WNL  Shoulder external rotation 30p! AROM WNL  (Blank rows = not tested)   UPPER EXTREMITY MMT:   MMT Right 06/21/2021 Left 06/21/2021  Shoulder flexion 3+/5p! 4/5  Shoulder abduction 3/5p! 4+/5  Shoulder internal rotation 4/5p! 5/5  Shoulder external rotation 3+/5p! 5/5  Middle trapezius 2+/5 3+/5   Lower trapezius 2+/5 3+/5  Elbow flexion 5/5 5/5  Elbow extension 5/5 5/5  Grip strength (lbs)      (Blank rows = not tested)   SHOULDER SPECIAL TESTS:           -Hawkins-Kennedy: (+) on Rt           -Neer's: (+) on Rt           -ERLS at 90d: (-) on Rt   JOINT MOBILITY TESTING:  GHJ: WNL all planes, minor pain with AP mobilization   PALPATION:  TTP to Rt supraspinatus and infraspinatus musculotendinous junction and subacromial space  FOTO 06/28/2021: 41%  BERG 06/28/2021: BERG BALANCE TEST Sitting to Standing: 3.      Stands independently using hands Standing Unsupported: 4.      Stands safely for 2 minutes Sitting Unsupported: 4.     Sits for 2 minutes independently Standing to Sitting: 3.     Controls descent with hands  Transfers: 1.     Needs 1 person assist Standing with eyes closed: 3.     Stands 10 seconds with supervision Standing with feet together: 3.     Stands for 1 minute with supervision Reaching forward with outstretched arm: 4.     Reaches forward 10 inches Retrieving object from the floor: 4.      Able to pick up easily and safely Turning to look behind: 2.     Turns sideways only, maintains balance Turning 360 degrees: 3.     Able to turn on one side in </= 4 seconds Place alternate foot on stool: 3.     Completes 8 steps in >20 seconds Standing with one foot in front: 1.     Needs help to step, but can hold for 15 seconds Standing on one foot: 3.     Holds 5-10 seconds  Total Score: 41/56             TODAY'S TREATMENT:   [facility] Adult PT Treatment:                                                DATE: 2/29/2023 Therapeutic Exercise: NuStep at self-selected pace x5 minutes while collecting subjective information 2-inch lateral heel taps 2x10 in Rt stance with UE support Functional squat with UE support 3x8 Standing windmill stretch 2x10 BIL Rt shoulder ER walkouts with 3# cable 2x8 with 5-sec hold  at end of walkout Manual Therapy: N/A Neuromuscular  re-ed: N/A Therapeutic Activity: N/A Modalities: N/A Self Care: N/A   [facility] Adult PT Treatment:                                                DATE: 06/28/2021 Therapeutic Exercise: Rows RTB x 10 ER/IR RTB Rt x 10 each Shoulder extension RTB x 10 Seated horizontal abduction RTB 2 x 10 Seated ER RTB 2 x 10 Seated diagonals RTB x 10 (terminated on R due to increase in pain) STS x 10 Lifting 2# into cabinet x 10 Therapeutic Activity: BERG assessment   06/21/2021: Issued HEP     PATIENT EDUCATION: Education details: Educated on probable underlying pathophysiology behind her shoulder pain, POC, prognosis, and HEP Person educated: Patient Education method: Consulting civil engineer, Demonstration, and Handouts Education comprehension: verbalized understanding and returned demonstration     HOME EXERCISE PROGRAM: Access Code: [email or web address] URL: [email or web address] Date: 06/21/2021 Prepared by: Vanessa [place]   Exercises Shoulder External Rotation and Scapular Retraction with Resistance - 1 x daily - 7 x weekly - 3 sets - 10 reps - 3-sec hold Standing Shoulder Row with Anchored Resistance - 1 x daily - 7 x weekly - 3 sets - 10 reps - 3-sec hold Shoulder Scaption AAROM with Dowel - 1 x daily - 7 x weekly - 3 sets - 10 reps - 5-sec hold     ASSESSMENT:   CLINICAL IMPRESSION: Due to the pt arriving 20 minutes late to her scheduled appointment, her session was truncated today. She tolerated all treatments well today, demonstrating good form and no increase in pain with selected exercises. She will benefit from continued skilled PT to address her primary impairments and return to her prior level of function with less limitation.     OBJECTIVE IMPAIRMENTS Abnormal gait, decreased balance, decreased mobility, difficulty walking, decreased ROM, decreased strength, impaired flexibility, impaired UE functional use, postural dysfunction, and pain.    ACTIVITY LIMITATIONS cleaning,  community activity, laundry, yard work, shopping, and church.    PERSONAL FACTORS 3+ comorbidities: HTN, DMII, anxiety  are also affecting patient's functional outcome.      REHAB POTENTIAL: Fair Due to chronicity of symptoms and multiple problems areas   CLINICAL DECISION MAKING: Evolving/moderate complexity   EVALUATION COMPLEXITY: Moderate     GOALS: Goals reviewed with patient? Yes   SHORT TERM GOALS:   STG Name Target Date Goal status  1 Pt will report understanding and adherence to her HEP in order to promote independence in the management of her primary impairments. Baseline:  HEP provided at eval 07/19/2021 INITIAL    LONG TERM GOALS:    LTG Name Target Date Goal status  1 Pt will achieve a FOTO score of ___ in order to demonstrate improved functional ability as it relates to her shoulder pain. Baseline: Administer FOTO at first treatment session 41% 08/16/2021 INITIAL  2 Pt will achieve Rt shoulder flexion/ abduction AROM of 150 degrees or greater in order to put away dishes in overhead cabinets. Baseline: Flexion = 90d, abduction = 85 degrees 08/16/2021 INITIAL  3 Pt will achieve global Rt shoulder strength of 4+/5 or greater in order to perform yard work with less limitation. Baseline: See MMT chart 08/16/2021 INITIAL  4 Pt will report ability to stand >30 minutes in order to fold clothes without limitation  due to knee pain. Baseline: Pt requires seated break after 15 minutes of standing due to knee pain. 08/16/2021 INITIAL    PLAN: PT FREQUENCY: 2x/week   PT DURATION: 8 weeks   PLANNED INTERVENTIONS: Therapeutic exercises, Therapeutic activity, Neuro Muscular re-education, Balance training, Gait training, Patient/Family education, Joint mobilization, Dry Needling, Electrical stimulation, Cryotherapy, Moist heat, Taping, Vasopneumatic device, and Manual therapy   PLAN FOR NEXT SESSION: Assess knee, balance training, progress Rt shoulder RTC strengthening as  able    Vanessa [place], PT, DPT 07/04/21 11:29 AM

## 2021-07-04 ENCOUNTER — Other Ambulatory Visit: Payer: Self-pay

## 2021-07-04 ENCOUNTER — Ambulatory Visit: Payer: Medicare PPO | Attending: Family Medicine

## 2021-07-04 DIAGNOSIS — M25561 Pain in right knee: Secondary | ICD-10-CM | POA: Insufficient documentation

## 2021-07-04 DIAGNOSIS — M6281 Muscle weakness (generalized): Secondary | ICD-10-CM | POA: Diagnosis not present

## 2021-07-04 DIAGNOSIS — G8929 Other chronic pain: Secondary | ICD-10-CM | POA: Insufficient documentation

## 2021-07-04 DIAGNOSIS — M25511 Pain in right shoulder: Secondary | ICD-10-CM | POA: Insufficient documentation

## 2021-07-06 ENCOUNTER — Other Ambulatory Visit: Payer: Self-pay

## 2021-07-06 ENCOUNTER — Ambulatory Visit: Payer: Medicare PPO

## 2021-07-06 DIAGNOSIS — G8929 Other chronic pain: Secondary | ICD-10-CM | POA: Diagnosis not present

## 2021-07-06 DIAGNOSIS — M25561 Pain in right knee: Secondary | ICD-10-CM | POA: Diagnosis not present

## 2021-07-06 DIAGNOSIS — M6281 Muscle weakness (generalized): Secondary | ICD-10-CM

## 2021-07-06 DIAGNOSIS — M25511 Pain in right shoulder: Secondary | ICD-10-CM | POA: Diagnosis not present

## 2021-07-06 NOTE — Therapy (Signed)
OUTPATIENT PHYSICAL THERAPY TREATMENT NOTE   Patient Name: Cassandra Cooke MRN: [id number] DOB:1940/12/09, 81 y.o., female Today's Date: 07/06/2021  PCP: Tonia Ghent, MD REFERRING PROVIDER: Tonia Ghent, MD   PT End of Session - 07/06/21 1045     Visit Number 4    Number of Visits 17    Date for PT Re-Evaluation 08/16/21    Authorization Type [employer] MCR    Authorization Time Period FOTO v6 and v10, KX mod at v15    Progress Note Due on Visit 10    PT Start Time 5638    PT Stop Time 1129    PT Time Calculation (min) 45 min    Activity Tolerance Patient tolerated treatment well    Behavior During Therapy WFL for tasks assessed/performed               Past Medical History:  Diagnosis Date   Allergy    Anxiety    during a period of social upheaval   Arthritis    Bloating    Cataract    Colon polyps    Diabetes mellitus type II    Borderline   Diverticulosis of colon    GERD (gastroesophageal reflux disease)    Heartburn    Hiatal hernia    Hypertension    Vitamin D deficiency    Past Surgical History:  Procedure Laterality Date   CATARACT EXTRACTION     Bil   COLONOSCOPY     DILATION AND CURETTAGE OF UTERUS     1 miscarriage   DILATION AND CURETTAGE OF UTERUS  01/2008   vaginal bleeding (Dr. Ronita Hipps)   [place]   right due to cysts   OOPHORECTOMY  1971   ovarian cystectomy left   POLYPECTOMY     VAGINAL DELIVERY     NSVD x 5   Patient Active Problem List   Diagnosis Date Noted   Joint pain 07/04/2019   Shoulder pain 12/30/2018   Vertigo 10/11/2018   Hyperglycemia 06/03/2018   Vaginal dryness 06/03/2018   Foot pain 09/01/2017   Healthcare maintenance 08/09/2016   Pain in knee joint 01/13/2015   Osteopenia 01/16/2014   Medicare annual wellness visit, subsequent 01/10/2014   Advance care planning 01/10/2014   Vitamin D deficiency 06/14/2010   GERD 03/15/2009   UNSPECIFIED MENOPAUSAL&POSTMENOPAUSAL DISORDER 06/01/2008    ANKLE EDEMA, CHRONIC 03/15/2008   LEG CRAMPS 11/18/2006   HYPERCHOLESTEROLEMIA 10/07/2006   OBESITY 10/07/2006   Essential hypertension 10/07/2006   DIVERTICULOSIS, COLON 10/07/2006   CONSTIPATION 10/07/2006    REFERRING DIAG: M25.519 (ICD-10-CM) - Shoulder pain, unspecified chronicity, unspecified laterality  THERAPY DIAG:  Chronic right shoulder pain  Muscle weakness (generalized)  Chronic pain of right knee  PERTINENT HISTORY: HTN, DMII, anxiety  PRECAUTIONS: None  ONSET DATE: 2 years ago  SUBJECTIVE: I'm achy all over because of this rain.  PAIN:  Are you having pain? Yes NPRS scale: 7/10 (Rt knee), 7/10 (Rt shoulder) PAIN TYPE: aching and sharp Pain description: intermittent  Aggravating factors: knee: walking > 1 hour, prolonged standing >15 minutes. Shoulder: laying on it, reaching overhead, or lifting > 5 pounds Relieving factors: rest, pain cream, pain medication, heating pad    OBJECTIVE:    DIAGNOSTIC FINDINGS:  None related to current problem   PATIENT SURVEYS:  FOTO : Administer at first treatment   COGNITION:          Overall cognitive status: Within functional limits for tasks assessed  SENSATION:          Light touch: Appears intact   POSTURE: Forward head and rounded shoulders   UPPER EXTREMITY AROM/PROM:   A/PROM Right 06/21/2021 Left 06/21/2021  Shoulder flexion 90p! AROM WNL  Shoulder abduction 85p! AROM WNL  Shoulder internal rotation 15p! AROM WNL  Shoulder external rotation 30p! AROM WNL  (Blank rows = not tested)   UPPER EXTREMITY MMT:   MMT Right 06/21/2021 Left 06/21/2021  Shoulder flexion 3+/5p! 4/5  Shoulder abduction 3/5p! 4+/5  Shoulder internal rotation 4/5p! 5/5  Shoulder external rotation 3+/5p! 5/5  Middle trapezius 2+/5 3+/5  Lower trapezius 2+/5 3+/5  Elbow flexion 5/5 5/5  Elbow extension 5/5 5/5  Grip strength (lbs)      (Blank rows = not tested)   SHOULDER SPECIAL TESTS:            -Hawkins-Kennedy: (+) on Rt           -Neer's: (+) on Rt           -ERLS at 90d: (-) on Rt   JOINT MOBILITY TESTING:  GHJ: WNL all planes, minor pain with AP mobilization   PALPATION:  TTP to Rt supraspinatus and infraspinatus musculotendinous junction and subacromial space  FOTO 06/28/2021: 41%  BERG 06/28/2021: BERG BALANCE TEST Sitting to Standing: 3.      Stands independently using hands Standing Unsupported: 4.      Stands safely for 2 minutes Sitting Unsupported: 4.     Sits for 2 minutes independently Standing to Sitting: 3.     Controls descent with hands  Transfers: 1.     Needs 1 person assist Standing with eyes closed: 3.     Stands 10 seconds with supervision Standing with feet together: 3.     Stands for 1 minute with supervision Reaching forward with outstretched arm: 4.     Reaches forward 10 inches Retrieving object from the floor: 4.      Able to pick up easily and safely Turning to look behind: 2.     Turns sideways only, maintains balance Turning 360 degrees: 3.     Able to turn on one side in </= 4 seconds Place alternate foot on stool: 3.     Completes 8 steps in >20 seconds Standing with one foot in front: 1.     Needs help to step, but can hold for 15 seconds Standing on one foot: 3.     Holds 5-10 seconds  Total Score: 41/56             TODAY'S TREATMENT:  [facility] Adult PT Treatment:                                                DATE: 07/06/2021 Therapeutic Exercise: Nustep level 4 x 5 mins while gathering subjective Lifting 3# into cabinet 2 x 10 Rt forward (middle shelf) Lifting 3# into cabinet x 10 Rt lateral (bottom shelf) Step ups forward/lateral Rt leading x 10 each 4" step Heel taps x 10 from 2" step Squats with UE support 2 x 10 Rows RTB 2 x 10 ER/IR RTB Rt 2 x 10 each Shoulder extension RTB 2 x 10 Seated horizontal abduction RTB 2 x 10 Seated ER RTB 2 x 10 STS 2 x 10 Neuromuscular re-ed: all @ counter Romberg stance eyes closed 2  x  30" Tandem stance BIL 2 x 30" Romberg stance on foam eyes open 2 x 30"   [facility] Adult PT Treatment:                                                DATE: 2/29/2023 Therapeutic Exercise: NuStep at self-selected pace x5 minutes while collecting subjective information 2-inch lateral heel taps 2x10 in Rt stance with UE support Functional squat with UE support 3x8 Standing windmill stretch 2x10 BIL Rt shoulder ER walkouts with 3# cable 2x8 with 5-sec hold at end of walkout Manual Therapy: N/A Neuromuscular re-ed: N/A Therapeutic Activity: N/A Modalities: N/A Self Care: N/A   [facility] Adult PT Treatment:                                                DATE: 06/28/2021 Therapeutic Exercise: Rows RTB x 10 ER/IR RTB Rt x 10 each Shoulder extension RTB x 10 Seated horizontal abduction RTB 2 x 10 Seated ER RTB 2 x 10 Seated diagonals RTB x 10 (terminated on R due to increase in pain) STS x 10 Lifting 2# into cabinet x 10 Therapeutic Activity: BERG assessment     PATIENT EDUCATION: Education details: Educated on probable underlying pathophysiology behind her shoulder pain, POC, prognosis, and HEP Person educated: Patient Education method: Explanation, Demonstration, and Handouts Education comprehension: verbalized understanding and returned demonstration     HOME EXERCISE PROGRAM: Access Code: [email or web address] URL: [email or web address] Date: 06/21/2021 Prepared by: Vanessa Huxley   Exercises Shoulder External Rotation and Scapular Retraction with Resistance - 1 x daily - 7 x weekly - 3 sets - 10 reps - 3-sec hold Standing Shoulder Row with Anchored Resistance - 1 x daily - 7 x weekly - 3 sets - 10 reps - 3-sec hold Shoulder Scaption AAROM with Dowel - 1 x daily - 7 x weekly - 3 sets - 10 reps - 5-sec hold     ASSESSMENT:   CLINICAL IMPRESSION: Patient presents to PT with moderate to high levels of pain in her right shoulder and knee, citing the recent weather  phenomena for her aching joints. She was able to tolerate majority of the session standing before needing to sit for a rest break. Focus of today's session on strengthening the right shoulder and right lower extremity as well as balance training. Patient continues to benefit from skilled PT services and should be progressed as able to improve functional independence.    OBJECTIVE IMPAIRMENTS Abnormal gait, decreased balance, decreased mobility, difficulty walking, decreased ROM, decreased strength, impaired flexibility, impaired UE functional use, postural dysfunction, and pain.    ACTIVITY LIMITATIONS cleaning, community activity, laundry, yard work, shopping, and church.    PERSONAL FACTORS 3+ comorbidities: HTN, DMII, anxiety  are also affecting patient's functional outcome.      REHAB POTENTIAL: Fair Due to chronicity of symptoms and multiple problems areas   CLINICAL DECISION MAKING: Evolving/moderate complexity   EVALUATION COMPLEXITY: Moderate     GOALS: Goals reviewed with patient? Yes   SHORT TERM GOALS:   STG Name Target Date Goal status  1 Pt will report understanding and adherence to her HEP in order to promote independence in the management of her primary impairments. Baseline:  HEP provided at eval 07/19/2021 INITIAL    LONG TERM GOALS:    LTG Name Target Date Goal status  1 Pt will achieve a FOTO score of ___ in order to demonstrate improved functional ability as it relates to her shoulder pain. Baseline: Administer FOTO at first treatment session 41% 08/16/2021 INITIAL  2 Pt will achieve Rt shoulder flexion/ abduction AROM of 150 degrees or greater in order to put away dishes in overhead cabinets. Baseline: Flexion = 90d, abduction = 85 degrees 08/16/2021 INITIAL  3 Pt will achieve global Rt shoulder strength of 4+/5 or greater in order to perform yard work with less limitation. Baseline: See MMT chart 08/16/2021 INITIAL  4 Pt will report ability to stand >30 minutes in  order to fold clothes without limitation due to knee pain. Baseline: Pt requires seated break after 15 minutes of standing due to knee pain. 08/16/2021 INITIAL    PLAN: PT FREQUENCY: 2x/week   PT DURATION: 8 weeks   PLANNED INTERVENTIONS: Therapeutic exercises, Therapeutic activity, Neuro Muscular re-education, Balance training, Gait training, Patient/Family education, Joint mobilization, Dry Needling, Electrical stimulation, Cryotherapy, Moist heat, Taping, Vasopneumatic device, and Manual therapy   PLAN FOR NEXT SESSION: Assess knee, balance training, progress Rt shoulder RTC strengthening as able   Evelene Croon, PTA 07/06/21 11:31 AM

## 2021-07-11 ENCOUNTER — Ambulatory Visit: Payer: Medicare PPO

## 2021-07-11 NOTE — Therapy (Incomplete)
OUTPATIENT PHYSICAL THERAPY TREATMENT NOTE   Patient Name: Cassandra Cooke MRN: [id number] DOB:10-30-1940, 81 y.o., female Today's Date: 07/11/2021  PCP: Tonia Ghent, MD REFERRING PROVIDER: Tonia Ghent, MD       Past Medical History:  Diagnosis Date   Allergy    Anxiety    during a period of social upheaval   Arthritis    Bloating    Cataract    Colon polyps    Diabetes mellitus type II    Borderline   Diverticulosis of colon    GERD (gastroesophageal reflux disease)    Heartburn    Hiatal hernia    Hypertension    Vitamin D deficiency    Past Surgical History:  Procedure Laterality Date   CATARACT EXTRACTION     Bil   COLONOSCOPY     DILATION AND CURETTAGE OF UTERUS     1 miscarriage   DILATION AND CURETTAGE OF UTERUS  01/2008   vaginal bleeding (Dr. Ronita Hipps)   [place]   right due to cysts   OOPHORECTOMY  1971   ovarian cystectomy left   POLYPECTOMY     VAGINAL DELIVERY     NSVD x 5   Patient Active Problem List   Diagnosis Date Noted   Joint pain 07/04/2019   Shoulder pain 12/30/2018   Vertigo 10/11/2018   Hyperglycemia 06/03/2018   Vaginal dryness 06/03/2018   Foot pain 09/01/2017   Healthcare maintenance 08/09/2016   Pain in knee joint 01/13/2015   Osteopenia 01/16/2014   Medicare annual wellness visit, subsequent 01/10/2014   Advance care planning 01/10/2014   Vitamin D deficiency 06/14/2010   GERD 03/15/2009   UNSPECIFIED MENOPAUSAL&POSTMENOPAUSAL DISORDER 06/01/2008   ANKLE EDEMA, CHRONIC 03/15/2008   LEG CRAMPS 11/18/2006   HYPERCHOLESTEROLEMIA 10/07/2006   OBESITY 10/07/2006   Essential hypertension 10/07/2006   DIVERTICULOSIS, COLON 10/07/2006   CONSTIPATION 10/07/2006    REFERRING DIAG: M25.519 (ICD-10-CM) - Shoulder pain, unspecified chronicity, unspecified laterality  THERAPY DIAG:  No diagnosis found.  PERTINENT HISTORY: HTN, DMII, anxiety  PRECAUTIONS: None  ONSET DATE: 2 years ago  SUBJECTIVE:  I'm achy all over because of this rain.  PAIN:  Are you having pain? Yes NPRS scale: 7/10 (Rt knee), 7/10 (Rt shoulder) PAIN TYPE: aching and sharp Pain description: intermittent  Aggravating factors: knee: walking > 1 hour, prolonged standing >15 minutes. Shoulder: laying on it, reaching overhead, or lifting > 5 pounds Relieving factors: rest, pain cream, pain medication, heating pad    OBJECTIVE:    DIAGNOSTIC FINDINGS:  None related to current problem   PATIENT SURVEYS:  FOTO : Administer at first treatment   COGNITION:          Overall cognitive status: Within functional limits for tasks assessed                               SENSATION:          Light touch: Appears intact   POSTURE: Forward head and rounded shoulders   UPPER EXTREMITY AROM/PROM:   A/PROM Right 06/21/2021 Left 06/21/2021  Shoulder flexion 90p! AROM WNL  Shoulder abduction 85p! AROM WNL  Shoulder internal rotation 15p! AROM WNL  Shoulder external rotation 30p! AROM WNL  (Blank rows = not tested)   UPPER EXTREMITY MMT:   MMT Right 06/21/2021 Left 06/21/2021  Shoulder flexion 3+/5p! 4/5  Shoulder abduction 3/5p! 4+/5  Shoulder internal rotation 4/5p! 5/5  Shoulder external rotation 3+/5p! 5/5  Middle trapezius 2+/5 3+/5  Lower trapezius 2+/5 3+/5  Elbow flexion 5/5 5/5  Elbow extension 5/5 5/5  Grip strength (lbs)      (Blank rows = not tested)   SHOULDER SPECIAL TESTS:           -Hawkins-Kennedy: (+) on Rt           -Neer's: (+) on Rt           -ERLS at 90d: (-) on Rt   JOINT MOBILITY TESTING:  GHJ: WNL all planes, minor pain with AP mobilization   PALPATION:  TTP to Rt supraspinatus and infraspinatus musculotendinous junction and subacromial space  FOTO 06/28/2021: 41%  BERG 06/28/2021: BERG BALANCE TEST Sitting to Standing: 3.      Stands independently using hands Standing Unsupported: 4.      Stands safely for 2 minutes Sitting Unsupported: 4.     Sits for 2 minutes  independently Standing to Sitting: 3.     Controls descent with hands  Transfers: 1.     Needs 1 person assist Standing with eyes closed: 3.     Stands 10 seconds with supervision Standing with feet together: 3.     Stands for 1 minute with supervision Reaching forward with outstretched arm: 4.     Reaches forward 10 inches Retrieving object from the floor: 4.      Able to pick up easily and safely Turning to look behind: 2.     Turns sideways only, maintains balance Turning 360 degrees: 3.     Able to turn on one side in </= 4 seconds Place alternate foot on stool: 3.     Completes 8 steps in >20 seconds Standing with one foot in front: 1.     Needs help to step, but can hold for 15 seconds Standing on one foot: 3.     Holds 5-10 seconds  Total Score: 41/56             TODAY'S TREATMENT:  [facility] Adult PT Treatment:                                                DATE: 07/11/2021 Therapeutic Exercise: Nustep level 4 x 5 mins while gathering subjective Lifting 3# into cabinet 2 x 10 Rt forward (middle shelf) Lifting 3# into cabinet x 10 Rt lateral (bottom shelf) Cybex hip abduction 12.5# 2x10 BIL Step ups forward/lateral Rt leading x 10 each 4" step Heel taps x 10 from 2" step Rt Squats with UE support 2 x 10 Rows GTB 2 x 10 ER/IR RTB Rt 2 x 10 each Shoulder extension GTB 2 x 10 Seated horizontal abduction RTB 2 x 10 Seated ER RTB 2 x 10 STS 2 x 10 Neuromuscular re-ed: all @ counter Romberg stance eyes closed 2 x 30" Tandem stance BIL 2 x 30" Romberg stance on foam eyes open 2 x 30" Taps to bosu x10 BIL   [facility] Adult PT Treatment:                                                DATE: 07/06/2021 Therapeutic Exercise: Nustep level 4 x 5 mins while gathering subjective  Lifting 3# into cabinet 2 x 10 Rt forward (middle shelf) Lifting 3# into cabinet x 10 Rt lateral (bottom shelf) Step ups forward/lateral Rt leading x 10 each 4" step Heel taps x 10 from 2" step Squats with UE support 2  x 10 Rows RTB 2 x 10 ER/IR RTB Rt 2 x 10 each Shoulder extension RTB 2 x 10 Seated horizontal abduction RTB 2 x 10 Seated ER RTB 2 x 10 STS 2 x 10 Neuromuscular re-ed: all @ counter Romberg stance eyes closed 2 x 30" Tandem stance BIL 2 x 30" Romberg stance on foam eyes open 2 x 30"   [facility] Adult PT Treatment:                                                DATE: 2/29/2023 Therapeutic Exercise: NuStep at self-selected pace x5 minutes while collecting subjective information 2-inch lateral heel taps 2x10 in Rt stance with UE support Functional squat with UE support 3x8 Standing windmill stretch 2x10 BIL Rt shoulder ER walkouts with 3# cable 2x8 with 5-sec hold at end of walkout Manual Therapy: N/A Neuromuscular re-ed: N/A Therapeutic Activity: N/A Modalities: N/A Self Care: N/A     PATIENT EDUCATION: Education details: Educated on probable underlying pathophysiology behind her shoulder pain, POC, prognosis, and HEP Person educated: Patient Education method: Explanation, Demonstration, and Handouts Education comprehension: verbalized understanding and returned demonstration     HOME EXERCISE PROGRAM: Access Code: [email or web address] URL: [email or web address] Date: 06/21/2021 Prepared by: Vanessa Kindred   Exercises Shoulder External Rotation and Scapular Retraction with Resistance - 1 x daily - 7 x weekly - 3 sets - 10 reps - 3-sec hold Standing Shoulder Row with Anchored Resistance - 1 x daily - 7 x weekly - 3 sets - 10 reps - 3-sec hold Shoulder Scaption AAROM with Dowel - 1 x daily - 7 x weekly - 3 sets - 10 reps - 5-sec hold     ASSESSMENT:   CLINICAL IMPRESSION: ***  Patient presents to PT with moderate to high levels of pain in her right shoulder and knee, citing the recent weather phenomena for her aching joints. She was able to tolerate majority of the session standing before needing to sit for a rest break. Focus of today's session on strengthening  the right shoulder and right lower extremity as well as balance training. Patient continues to benefit from skilled PT services and should be progressed as able to improve functional independence.    OBJECTIVE IMPAIRMENTS Abnormal gait, decreased balance, decreased mobility, difficulty walking, decreased ROM, decreased strength, impaired flexibility, impaired UE functional use, postural dysfunction, and pain.    ACTIVITY LIMITATIONS cleaning, community activity, laundry, yard work, shopping, and church.    PERSONAL FACTORS 3+ comorbidities: HTN, DMII, anxiety  are also affecting patient's functional outcome.      REHAB POTENTIAL: Fair Due to chronicity of symptoms and multiple problems areas   CLINICAL DECISION MAKING: Evolving/moderate complexity   EVALUATION COMPLEXITY: Moderate     GOALS: Goals reviewed with patient? Yes   SHORT TERM GOALS:   STG Name Target Date Goal status  1 Pt will report understanding and adherence to her HEP in order to promote independence in the management of her primary impairments. Baseline:  HEP provided at eval 07/19/2021 INITIAL    LONG TERM GOALS:    LTG Name Target Date  Goal status  1 Pt will achieve a FOTO score of ___ in order to demonstrate improved functional ability as it relates to her shoulder pain. Baseline: Administer FOTO at first treatment session 41% 08/16/2021 INITIAL  2 Pt will achieve Rt shoulder flexion/ abduction AROM of 150 degrees or greater in order to put away dishes in overhead cabinets. Baseline: Flexion = 90d, abduction = 85 degrees 08/16/2021 INITIAL  3 Pt will achieve global Rt shoulder strength of 4+/5 or greater in order to perform yard work with less limitation. Baseline: See MMT chart 08/16/2021 INITIAL  4 Pt will report ability to stand >30 minutes in order to fold clothes without limitation due to knee pain. Baseline: Pt requires seated break after 15 minutes of standing due to knee pain. 08/16/2021 INITIAL     PLAN: PT FREQUENCY: 2x/week   PT DURATION: 8 weeks   PLANNED INTERVENTIONS: Therapeutic exercises, Therapeutic activity, Neuro Muscular re-education, Balance training, Gait training, Patient/Family education, Joint mobilization, Dry Needling, Electrical stimulation, Cryotherapy, Moist heat, Taping, Vasopneumatic device, and Manual therapy   PLAN FOR NEXT SESSION: Assess knee, balance training, progress Rt shoulder RTC strengthening as able, FOTO next session visit #6   Evelene Croon, PTA 07/11/21 11:36 AM

## 2021-07-18 NOTE — Therapy (Addendum)
?OUTPATIENT PHYSICAL THERAPY TREATMENT NOTE ? ? ?Patient Name: Cassandra Cooke ?MRN: [id number] ?DOB:27-May-1940, 81 y.o., female ?Today's Date: 07/31/2021 ? ?PCP: Tonia Ghent, MD ?REFERRING PROVIDER: Tonia Ghent, MD ? ? ? ? ? ? ? ?Past Medical History:  ?Diagnosis Date  ? Allergy   ? Anxiety   ? during a period of social upheaval  ? Arthritis   ? Bloating   ? Cataract   ? Colon polyps   ? Diabetes mellitus type II   ? Borderline  ? Diverticulosis of colon   ? GERD (gastroesophageal reflux disease)   ? Heartburn   ? Hiatal hernia   ? Hypertension   ? Vitamin D deficiency   ? ?Past Surgical History:  ?Procedure Laterality Date  ? CATARACT EXTRACTION    ? Bil  ? COLONOSCOPY    ? DILATION AND CURETTAGE OF UTERUS    ? 1 miscarriage  ? DILATION AND CURETTAGE OF UTERUS  01/2008  ? vaginal bleeding (Dr. Ronita Hipps)  ? OOPHORECTOMY  1965  ? right due to cysts  ? OOPHORECTOMY  1971  ? ovarian cystectomy left  ? POLYPECTOMY    ? VAGINAL DELIVERY    ? NSVD x 5  ? ?Patient Active Problem List  ? Diagnosis Date Noted  ? Joint pain 07/04/2019  ? Shoulder pain 12/30/2018  ? Vertigo 10/11/2018  ? Hyperglycemia 06/03/2018  ? Vaginal dryness 06/03/2018  ? Foot pain 09/01/2017  ? Healthcare maintenance 08/09/2016  ? Pain in knee joint 01/13/2015  ? Osteopenia 01/16/2014  ? Medicare annual wellness visit, subsequent 01/10/2014  ? Advance care planning 01/10/2014  ? Vitamin D deficiency 06/14/2010  ? GERD 03/15/2009  ? UNSPECIFIED MENOPAUSAL&POSTMENOPAUSAL DISORDER 06/01/2008  ? ANKLE EDEMA, CHRONIC 03/15/2008  ? LEG CRAMPS 11/18/2006  ? HYPERCHOLESTEROLEMIA 10/07/2006  ? OBESITY 10/07/2006  ? Essential hypertension 10/07/2006  ? DIVERTICULOSIS, COLON 10/07/2006  ? CONSTIPATION 10/07/2006  ? ? ?REFERRING DIAG: M25.519 (ICD-10-CM) - Shoulder pain, unspecified chronicity, unspecified laterality ? ?THERAPY DIAG:  ?Chronic right shoulder pain ? ?Muscle weakness (generalized) ? ?Chronic pain of right knee ? ?PERTINENT HISTORY: HTN,  DMII, anxiety ? ?PRECAUTIONS: None ? ?ONSET DATE: 2 years ago ? ?SUBJECTIVE: Pt reports vast improvement in her pain since starting PT, however, she reports 5/10 Rt shoulder and knee pain today. She reports daily HEP adherence and adds that she has also been working out at [employer]. ? ?PAIN:  ?Are you having pain? Yes ?NPRS scale: 5/10 (Rt knee), 5/10 (Rt shoulder) ?PAIN TYPE: aching and sharp ?Pain description: intermittent  ?Aggravating factors: knee: walking > 1 hour, prolonged standing >15 minutes. Shoulder: laying on it, reaching overhead, or lifting > 5 pounds ?Relieving factors: rest, pain cream, pain medication, heating pad ? ? ? ?OBJECTIVE:  ? *Unless otherwise noted, objective information collected previously* ? ?DIAGNOSTIC FINDINGS:  ?None related to current problem ?  ?PATIENT SURVEYS:  ?FOTO : Administer at first treatment ?  ?COGNITION: ?         Overall cognitive status: Within functional limits for tasks assessed ?                              ?SENSATION: ?         Light touch: Appears intact ?  ?POSTURE: ?Forward head and rounded shoulders ?  ?UPPER EXTREMITY AROM/PROM: ?  ?A/PROM Right ?06/21/2021 Left ?06/21/2021 Right ?07/19/2021  ?Shoulder flexion 90p! AROM WNL 160/170p!  ?Shoulder abduction  85p! AROM WNL 180/185  ?Shoulder internal rotation 15p! AROM WNL 50/60  ?Shoulder external rotation 30p! AROM WNL 55/75  ?(Blank rows = not tested) ?  ?UPPER EXTREMITY MMT: ?  ?MMT Right ?06/21/2021 Left ?06/21/2021 Right ?07/19/2021 Left ?07/19/2021  ?Shoulder flexion 3+/5p! 4/5 4+/5 5/5  ?Shoulder abduction 3/5p! 4+/5 4+/5 5/5  ?Shoulder internal rotation 4/5p! 5/5 5/5 5/5  ?Shoulder external rotation 3+/5p! 5/5 4+/5 minor pain 5/5  ?Middle trapezius 2+/5 3+/5    ?Lower trapezius 2+/5 3+/5    ?Elbow flexion 5/5 5/5    ?Elbow extension 5/5 5/5    ?Grip strength (lbs)        ?(Blank rows = not tested) ?  ?SHOULDER SPECIAL TESTS: ?          -Hawkins-Kennedy: (+) on Rt ?          -Neer's: (+) on Rt ?          -ERLS at  90d: (-) on Rt ?  ?JOINT MOBILITY TESTING:  ?GHJ: WNL all planes, minor pain with AP mobilization ?  ?PALPATION:  ?TTP to Rt supraspinatus and infraspinatus musculotendinous junction and subacromial space ? ?FOTO ?06/28/2021: 41% ?07/19/2021:  ? ?BERG ?06/28/2021: ?BERG BALANCE TEST ?Sitting to Standing: 3.      Stands independently using hands ?Standing Unsupported: 4.      Stands safely for 2 minutes ?Sitting Unsupported: 4.     Sits for 2 minutes independently ?Standing to Sitting: 3.     Controls descent with hands  ?Transfers: 1.     Needs 1 person assist ?Standing with eyes closed: 3.     Stands 10 seconds with supervision ?Standing with feet together: 3.     Stands for 1 minute with supervision ?Reaching forward with outstretched arm: 4.     Reaches forward 10 inches ?Retrieving object from the floor: 4.      Able to pick up easily and safely ?Turning to look behind: 2.     Turns sideways only, maintains balance ?Turning 360 degrees: 3.     Able to turn on one side in </= 4 seconds ?Place alternate foot on stool: 3.     Completes 8 steps in >20 seconds ?Standing with one foot in front: 1.     Needs help to step, but can hold for 15 seconds ?Standing on one foot: 3.     Holds 5-10 seconds ? ?Total Score: 41/56 ? ?           ?TODAY'S TREATMENT:  ? ?[place] Adult PT Treatment:                                                DATE: 07/19/2021 ?Therapeutic Exercise: ?Mini squat with two 3# cables at shoulder level at [facility] machine 3x8 ?Standing PNF D2 shoulder flexion with YTB 2x10 on Rt ?Standing shoulder rolls 2x10 forward and backward ?Standing Rt shoulder ER with 3# cable 3x8 ?Lateral heel taps on 2-inch step 2x10 BIL ?Manual Therapy: ?N/A ?Neuromuscular re-ed: ?N/A ?Therapeutic Activity: ?Re-assessment of objective measures with pt education about progress made in PT to this point ?FOTO administration ?Modalities: ?N/A ?Self Care: ?N/A ? ? ?[place] Adult PT Treatment:  DATE: 07/06/2021 ?Therapeutic Exercise: ?Nustep level 4 x 5 mins while gathering subjective ?Lifting 3# into cabinet 2 x 10 Rt forward (middle shelf) ?Lifting 3# into cabinet x 10 Rt lateral (bottom shelf) ?Step ups forward/lateral Rt leading x 10 each 4" step ?Heel taps x 10 from 2" step ?Squats with UE support 2 x 10 ?Rows RTB 2 x 10 ?ER/IR RTB Rt 2 x 10 each ?Shoulder extension RTB 2 x 10 ?Seated horizontal abduction RTB 2 x 10 ?Seated ER RTB 2 x 10 ?STS 2 x 10 ?Neuromuscular re-ed: all @ counter ?Romberg stance eyes closed 2 x 30" ?Tandem stance BIL 2 x 30" ?Romberg stance on foam eyes open 2 x 30" ? ? ?[facility] Adult PT Treatment:                                                DATE: 2/29/2023 ?Therapeutic Exercise: ?NuStep at self-selected pace x5 minutes while collecting subjective information ?2-inch lateral heel taps 2x10 in Rt stance with UE support ?Functional squat with UE support 3x8 ?Standing windmill stretch 2x10 BIL ?Rt shoulder ER walkouts with 3# cable 2x8 with 5-sec hold at end of walkout ?Manual Therapy: ?N/A ?Neuromuscular re-ed: ?N/A ?Therapeutic Activity: ?N/A ?Modalities: ?N/A ?Self Care: ?N/A ? ?  ?  ?PATIENT EDUCATION: ?Education details: Educated pt on objective progress in PT to this point; updated HEP ?Person educated: Patient ?Education method: Explanation, Demonstration, and Handouts ?Education comprehension: verbalized understanding and returned demonstration ?  ?  ?HOME EXERCISE PROGRAM: ?Access Code: [email or web address] ?URL: [email or web address] ?Date: 06/21/2021 ?Prepared by: Vanessa Okaton ?  ?Exercises ?Shoulder External Rotation and Scapular Retraction with Resistance - 1 x daily - 7 x weekly - 3 sets - 10 reps - 3-sec hold ?Standing Shoulder Row with Anchored Resistance - 1 x daily - 7 x weekly - 3 sets - 10 reps - 3-sec hold ?Shoulder Scaption AAROM with Dowel - 1 x daily - 7 x weekly - 3 sets - 10 reps - 5-sec hold ? ?Added 07/19/2021: ?Shoulder PNF D2 with Resistance - 1 x  daily - 7 x weekly - 2 sets - 10 reps ?Mini Squat with Counter Support - 1 x daily - 7 x weekly - 3 sets - 10 reps ?  ?  ?ASSESSMENT: ?  ?CLINICAL IMPRESSION: ?The pt responded well to all exercises today,

## 2021-07-19 ENCOUNTER — Ambulatory Visit: Payer: Medicare PPO

## 2021-07-19 ENCOUNTER — Other Ambulatory Visit: Payer: Self-pay

## 2021-07-19 DIAGNOSIS — M6281 Muscle weakness (generalized): Secondary | ICD-10-CM | POA: Diagnosis not present

## 2021-07-19 DIAGNOSIS — M25511 Pain in right shoulder: Secondary | ICD-10-CM | POA: Diagnosis not present

## 2021-07-19 DIAGNOSIS — G8929 Other chronic pain: Secondary | ICD-10-CM | POA: Diagnosis not present

## 2021-07-19 DIAGNOSIS — M25561 Pain in right knee: Secondary | ICD-10-CM | POA: Diagnosis not present

## 2021-07-25 NOTE — Therapy (Addendum)
?OUTPATIENT PHYSICAL THERAPY TREATMENT NOTE/ DISCHARGE SUMMARY ? ? ?Patient Name: Cassandra Cooke ?MRN: [id number] ?DOB:10/19/1940, 81 y.o., female ?Today's Date: 07/26/2021 ? ?PCP: Tonia Ghent, MD ?REFERRING PROVIDER: Tonia Ghent, MD ? ? PT End of Session - 07/26/21 1358   ? ? Visit Number 6   ? Number of Visits 17   ? Date for PT Re-Evaluation 08/16/21   ? Authorization Type [employer] MCR   ? Authorization Time Period FOTO v6 and v10, KX mod at v15   ? Progress Note Due on Visit 10   ? PT Start Time 1400   ? PT Stop Time 3335   ? PT Time Calculation (min) 45 min   ? Activity Tolerance Patient tolerated treatment well   ? Behavior During Therapy [facility] for tasks assessed/performed   ? ?  ?  ? ?  ? ? ? ? ? ? ?Past Medical History:  ?Diagnosis Date  ? Allergy   ? Anxiety   ? during a period of social upheaval  ? Arthritis   ? Bloating   ? Cataract   ? Colon polyps   ? Diabetes mellitus type II   ? Borderline  ? Diverticulosis of colon   ? GERD (gastroesophageal reflux disease)   ? Heartburn   ? Hiatal hernia   ? Hypertension   ? Vitamin D deficiency   ? ?Past Surgical History:  ?Procedure Laterality Date  ? CATARACT EXTRACTION    ? Bil  ? COLONOSCOPY    ? DILATION AND CURETTAGE OF UTERUS    ? 1 miscarriage  ? DILATION AND CURETTAGE OF UTERUS  01/2008  ? vaginal bleeding (Dr. Ronita Hipps)  ? OOPHORECTOMY  1965  ? right due to cysts  ? OOPHORECTOMY  1971  ? ovarian cystectomy left  ? POLYPECTOMY    ? VAGINAL DELIVERY    ? NSVD x 5  ? ?Patient Active Problem List  ? Diagnosis Date Noted  ? Joint pain 07/04/2019  ? Shoulder pain 12/30/2018  ? Vertigo 10/11/2018  ? Hyperglycemia 06/03/2018  ? Vaginal dryness 06/03/2018  ? Foot pain 09/01/2017  ? Healthcare maintenance 08/09/2016  ? Pain in knee joint 01/13/2015  ? Osteopenia 01/16/2014  ? Medicare annual wellness visit, subsequent 01/10/2014  ? Advance care planning 01/10/2014  ? Vitamin D deficiency 06/14/2010  ? GERD 03/15/2009  ? UNSPECIFIED MENOPAUSAL&POSTMENOPAUSAL  DISORDER 06/01/2008  ? ANKLE EDEMA, CHRONIC 03/15/2008  ? LEG CRAMPS 11/18/2006  ? HYPERCHOLESTEROLEMIA 10/07/2006  ? OBESITY 10/07/2006  ? Essential hypertension 10/07/2006  ? DIVERTICULOSIS, COLON 10/07/2006  ? CONSTIPATION 10/07/2006  ? ? ?REFERRING DIAG: M25.519 (ICD-10-CM) - Shoulder pain, unspecified chronicity, unspecified laterality ? ?THERAPY DIAG:  ?Chronic right shoulder pain ? ?Muscle weakness (generalized) ? ?Chronic pain of right knee ? ?PERTINENT HISTORY: HTN, DMII, anxiety ? ?PRECAUTIONS: None ? ?ONSET DATE: 2 years ago ? ?SUBJECTIVE: Pt reports minor Rt shoulder pain today. She also reports only doing her HEP about twice per week. She adds that her Rt knee pain is 5/10. ? ?PAIN:  ?Are you having pain? Yes ?NPRS scale: 5/10 (Rt knee), 4/10 (Rt shoulder) ?PAIN TYPE: aching and sharp ?Pain description: intermittent  ?Aggravating factors: knee: walking > 1 hour, prolonged standing >15 minutes. Shoulder: laying on it, reaching overhead, or lifting > 5 pounds ?Relieving factors: rest, pain cream, pain medication, heating pad ? ? ? ?OBJECTIVE:  ? *Unless otherwise noted, objective information collected previously* ? ?DIAGNOSTIC FINDINGS:  ?None related to current problem ?  ?PATIENT SURVEYS:  ?  FOTO : 06/28/2021: 41% ? 07/19/2021: 46% ?  ?COGNITION: ?         Overall cognitive status: Within functional limits for tasks assessed ?                              ?SENSATION: ?         Light touch: Appears intact ?  ?POSTURE: ?Forward head and rounded shoulders ?  ?UPPER EXTREMITY AROM/PROM: ?  ?A/PROM Right ?06/21/2021 Left ?06/21/2021 Right ?07/19/2021  ?Shoulder flexion 90p! AROM WNL 160/170p!  ?Shoulder abduction 85p! AROM WNL 180/185  ?Shoulder internal rotation 15p! AROM WNL 50/60  ?Shoulder external rotation 30p! AROM WNL 55/75  ?(Blank rows = not tested) ? ?LE ROM: ?  ?A/PROM Right ?07/26/2021 Left ?07/26/2021  ?Knee flexion 98/115p! 115/117  ?Knee extension 0/2p! 0/2  ? ?  ?UPPER EXTREMITY MMT: ?  ?MMT  Right ?06/21/2021 Left ?06/21/2021 Right ?07/19/2021 Left ?07/19/2021  ?Shoulder flexion 3+/5p! 4/5 4+/5 5/5  ?Shoulder abduction 3/5p! 4+/5 4+/5 5/5  ?Shoulder internal rotation 4/5p! 5/5 5/5 5/5  ?Shoulder external rotation 3+/5p! 5/5 4+/5 minor pain 5/5  ?Middle trapezius 2+/5 3+/5    ?Lower trapezius 2+/5 3+/5    ?Elbow flexion 5/5 5/5    ?Elbow extension 5/5 5/5    ?Grip strength (lbs)        ?(Blank rows = not tested) ?  ?SHOULDER SPECIAL TESTS: ?          -Hawkins-Kennedy: (+) on Rt ?          -Neer's: (+) on Rt ?          -ERLS at 90d: (-) on Rt ? ?KNEE SPECIAL TESTS: ?            -Patellar compression: (+) on Rt ? -patellar apprehension: (+) on Rt ? -Lateral pull: (+) on Rt ? -McMurray's: (-) on Rt ?  ?JOINT MOBILITY TESTING:  ?GHJ: WNL all planes, minor pain with AP mobilization ?  ?PALPATION:  ?TTP to Rt supraspinatus and infraspinatus musculotendinous junction and subacromial space ? ?FOTO ?06/28/2021: 41% ?07/19/2021: 46% ? ?BERG ?06/28/2021: ?BERG BALANCE TEST ?Sitting to Standing: 3.      Stands independently using hands ?Standing Unsupported: 4.      Stands safely for 2 minutes ?Sitting Unsupported: 4.     Sits for 2 minutes independently ?Standing to Sitting: 3.     Controls descent with hands  ?Transfers: 1.     Needs 1 person assist ?Standing with eyes closed: 3.     Stands 10 seconds with supervision ?Standing with feet together: 3.     Stands for 1 minute with supervision ?Reaching forward with outstretched arm: 4.     Reaches forward 10 inches ?Retrieving object from the floor: 4.      Able to pick up easily and safely ?Turning to look behind: 2.     Turns sideways only, maintains balance ?Turning 360 degrees: 3.     Able to turn on one side in </= 4 seconds ?Place alternate foot on stool: 3.     Completes 8 steps in >20 seconds ?Standing with one foot in front: 1.     Needs help to step, but can hold for 15 seconds ?Standing on one foot: 3.     Holds 5-10 seconds ? ?Total Score: 41/56 ? ?            ?TODAY'S TREATMENT:  ? ?[place] Adult  PT Treatment:                                                DATE: 07/26/2021 ?Therapeutic Exercise: ?Side knee plank 2x30sec BIL ?Prone swimmers 3x10 BIL ?Standing mini marches with shoulder rolls 2x10 forward and backward ?Standing PNF D2 shoulder flexion with YTB 2x10 on Rt ?Mini-squat Rt shoulder ER walkouts with RTB 2x8 with 5-sec hold at end range ?Manual Therapy: ?N/A ?Neuromuscular re-ed: ?N/A ?Therapeutic Activity: ?Objective assessment measures for knee pain with education on probable underlying pathophysiology ?10# kettlebell dead lifts in front of table 3x8 ?4-inch Rt step-up with Lt retro strep down with 10# kettlebell suitcase carry on Rt 3x10 ?Modalities: ?N/A ?Self Care: ?N/A ? ? ?[place] Adult PT Treatment:                                                DATE: 07/19/2021 ?Therapeutic Exercise: ?Mini squat with two 3# cables at shoulder level at [facility] machine 3x8 ?Standing PNF D2 shoulder flexion with YTB 2x10 on Rt ?Standing shoulder rolls 2x10 forward and backward ?Standing Rt shoulder ER with 3# cable 3x8 ?Lateral heel taps on 2-inch step 2x10 BIL ?Manual Therapy: ?N/A ?Neuromuscular re-ed: ?N/A ?Therapeutic Activity: ?Re-assessment of objective measures with pt education about progress made in PT to this point ?FOTO administration ?Modalities: ?N/A ?Self Care: ?N/A ? ? ?[place] Adult PT Treatment:                                                DATE: 07/06/2021 ?Therapeutic Exercise: ?Nustep level 4 x 5 mins while gathering subjective ?Lifting 3# into cabinet 2 x 10 Rt forward (middle shelf) ?Lifting 3# into cabinet x 10 Rt lateral (bottom shelf) ?Step ups forward/lateral Rt leading x 10 each 4" step ?Heel taps x 10 from 2" step ?Squats with UE support 2 x 10 ?Rows RTB 2 x 10 ?ER/IR RTB Rt 2 x 10 each ?Shoulder extension RTB 2 x 10 ?Seated horizontal abduction RTB 2 x 10 ?Seated ER RTB 2 x 10 ?STS 2 x 10 ?Neuromuscular re-ed: all @ counter ?Romberg stance eyes closed  2 x 30" ?Tandem stance BIL 2 x 30" ?Romberg stance on foam eyes open 2 x 30" ? ? ?  ?  ?PATIENT EDUCATION: ?Education details: Educated pt on objective progress in PT to this point; updated HEP ?Person educated:

## 2021-07-26 ENCOUNTER — Ambulatory Visit: Payer: Medicare PPO

## 2021-07-26 ENCOUNTER — Other Ambulatory Visit: Payer: Self-pay

## 2021-07-26 DIAGNOSIS — M25511 Pain in right shoulder: Secondary | ICD-10-CM | POA: Diagnosis not present

## 2021-07-26 DIAGNOSIS — M6281 Muscle weakness (generalized): Secondary | ICD-10-CM

## 2021-07-26 DIAGNOSIS — M25561 Pain in right knee: Secondary | ICD-10-CM | POA: Diagnosis not present

## 2021-07-26 DIAGNOSIS — G8929 Other chronic pain: Secondary | ICD-10-CM | POA: Diagnosis not present

## 2021-08-17 ENCOUNTER — Other Ambulatory Visit: Payer: Self-pay | Admitting: Family Medicine

## 2021-08-20 ENCOUNTER — Other Ambulatory Visit: Payer: Self-pay

## 2021-08-20 MED ORDER — METOPROLOL SUCCINATE ER 50 MG PO TB24: 100.0000 mg | ORAL_TABLET | Freq: Every day | ORAL | 3 refills | Status: DC

## 2021-08-22 ENCOUNTER — Other Ambulatory Visit (INDEPENDENT_AMBULATORY_CARE_PROVIDER_SITE_OTHER): Payer: Medicare PPO

## 2021-08-22 DIAGNOSIS — E559 Vitamin D deficiency, unspecified: Secondary | ICD-10-CM | POA: Diagnosis not present

## 2021-08-22 LAB — VITAMIN D 25 HYDROXY (VIT D DEFICIENCY, FRACTURES): VITD: 39.82 ng/mL (ref 30.00–100.00)

## 2021-08-23 ENCOUNTER — Other Ambulatory Visit: Payer: Self-pay | Admitting: Family Medicine

## 2021-08-23 MED ORDER — VITAMIN D3 50 MCG (2000 UT) PO CAPS: 2000.0000 [IU] | ORAL_CAPSULE | Freq: Every day | ORAL | Status: DC

## 2021-09-13 ENCOUNTER — Telehealth: Payer: Self-pay | Admitting: Family Medicine

## 2021-09-13 NOTE — Telephone Encounter (Addendum)
Unable to reach pt by phone; per access note pt agreed to go to ED. Sending note to Dr Joanne Gavel CMA and [place] triage.unable to reach Cassandra Cooke as well. I spoke with pts husband, pt is laying down resting now; pt just took her med; pt was feeling lightheaded but does not feel lightheaded now. Pt has not recked BP and might do that later. Pts husband said he is taking care of her. UC & ED precautions given and pts husband voiced understanding. Pt or pts husband will call Grandfather on 09/14/21 with update. Sending note to Dr Damita Dunnings and Janett Billow CMA. ?

## 2021-09-13 NOTE — Telephone Encounter (Signed)
[facility] - Client ?TELEPHONE ADVICE RECORD ?AccessNurse? ?Patient ?Name: ?Cassandra P ?Cooke ?Gender: Female ?DOB: [birth year] ?Age: 81 Y 47 D ?Return ?Phone ?Number: ?[phone] ?(Primary), ?[id number] ?(Secondary) ?Address: [place] [address] ?City/ ?State/ ?Zip: ?[place] ? [address] ?Client [facility] - Client ?Client Site [facility] ?Provider Renford Dills - MD ?Contact Type Call ?Who Is Calling Patient / Member / Family / Caregiver ?Call Type Triage / Clinical ?Relationship To Patient Self ?Return Phone Number [phone] (Primary) ?Chief Complaint BLOOD PRESSURE HIGH - Systolic (top ?number) 262 or greater ?Reason for Call Symptomatic / Request for Health Information ?Initial Comment [name] is calling about her blood pressure 205/95. ?Translation No ?Nurse Assessment ?Nurse: Cassandra Stain, Cassandra Cooke, Cassandra Cooke Date/Time (Eastern Time): 09/13/2021 3:59:59 PM ?Confirm and document reason for call. If ?symptomatic, describe symptoms. ?---[name] states blood pressure 205/95. States took BP ?at [employer], states her BP machine is not working. ?Says +HA, +blurred vision, +dizzy feeling. States has ?not had BP medication since Tuesday. (states has been ?away from home) ?Does the patient have any new or worsening ?symptoms? ---Yes ?Will a triage be completed? ---Yes ?Related visit to physician within the last 2 weeks? ---No ?Does the PT have any chronic conditions? (i.e. ?diabetes, asthma, this includes High risk factors for ?pregnancy, etc.) ?---Yes ?List chronic conditions. ---HTN, arthritis ?Is this a behavioral health or substance abuse call? ---No ?Guidelines ?Guideline Title Affirmed Question Affirmed Notes Nurse Date/Time (Eastern ?Time) ?Blood Pressure - ?High ?[0] Systolic BP >= ?355 OR Diastolic ?>= 974 AND [1] ?cardiac or neurologic ?symptoms (e.g., ?chest pain, difficulty ?breathing, unsteady ?gait, blurred vision) ?Cassandra Stain, Cassandra Cooke,  [facility] 09/13/2021 4:03:15 ?PM ?PLEASE NOTE: All timestamps contained within this report are represented as [place] Standard Time. ?CONFIDENTIALTY NOTICE: This fax transmission is intended only for the addressee. It contains information that is legally privileged, confidential or ?otherwise protected from use or disclosure. If you are not the intended recipient, you are strictly prohibited from reviewing, disclosing, copying using ?or disseminating any of this information or taking any action in reliance on or regarding this information. If you have received this fax in error, please ?notify us immediately by telephone so that we can arrange for its return to [place]. Phone: [phone], Toll-Free: [phone], Fax: [phone] ?Page: 2 of 2 ?Call Id: [id number] ?Disp. Time (Eastern ?Time) Disposition Final User ?09/13/2021 3:58:56 PM Send to Urgent Cassandra Cooke, Cassandra Cooke ?09/13/2021 4:05:32 PM Go to ED Now Yes Cassandra Stain, Cassandra Cooke, Cassandra Cooke ?[name] Disagree/Comply Comply ?[name] Understands Yes ?PreDisposition InappropriateToAsk ?Care Advice Given Per Guideline ?GO TO ED NOW: * You need to be seen in the Emergency Department. * Go to the ED at ___________ [place] now. ?Drive carefully. ?Referrals ?GO TO FACILITY UNDECIDE ?

## 2021-09-13 NOTE — Telephone Encounter (Signed)
Pt said she was advised to call PCP because her BP was reading 205/95, I transferred to the triage nurse for further help ?

## 2021-09-14 NOTE — Telephone Encounter (Signed)
Please get update on patient.  Thanks. 

## 2021-09-14 NOTE — Telephone Encounter (Signed)
Called and spoke with patient and she is doing well. Patient states she is back on her medication and feels so much better. States her BP last night was 145/85 and she has not taken it today. Advised patient if she has anymore problems to let us know and we are glad she is feeling better.  ?

## 2021-09-16 NOTE — Telephone Encounter (Signed)
Agree. Thanks

## 2021-09-26 ENCOUNTER — Ambulatory Visit (HOSPITAL_COMMUNITY)
Admission: EM | Admit: 2021-09-26 | Discharge: 2021-09-26 | Disposition: A | Discharge status: Home / Self Care | Payer: Medicare PPO | Attending: Physician Assistant | Admitting: Physician Assistant

## 2021-09-26 ENCOUNTER — Encounter (HOSPITAL_COMMUNITY): Payer: Self-pay | Admitting: Emergency Medicine

## 2021-09-26 ENCOUNTER — Ambulatory Visit (INDEPENDENT_AMBULATORY_CARE_PROVIDER_SITE_OTHER): Payer: Medicare PPO

## 2021-09-26 DIAGNOSIS — M7989 Other specified soft tissue disorders: Secondary | ICD-10-CM | POA: Diagnosis not present

## 2021-09-26 DIAGNOSIS — M25531 Pain in right wrist: Secondary | ICD-10-CM | POA: Diagnosis not present

## 2021-09-26 DIAGNOSIS — M19031 Primary osteoarthritis, right wrist: Secondary | ICD-10-CM | POA: Diagnosis not present

## 2021-09-26 DIAGNOSIS — M1711 Unilateral primary osteoarthritis, right knee: Secondary | ICD-10-CM

## 2021-09-26 DIAGNOSIS — I10 Essential (primary) hypertension: Secondary | ICD-10-CM

## 2021-09-26 DIAGNOSIS — M25561 Pain in right knee: Secondary | ICD-10-CM

## 2021-09-26 MED ORDER — PREDNISONE 20 MG PO TABS: 20.0000 mg | ORAL_TABLET | Freq: Every day | ORAL | 0 refills | Status: DC

## 2021-09-26 NOTE — ED Provider Notes (Signed)
[place]    CSN: [id number] Arrival date & time: 09/26/21  1244      History   Chief Complaint Chief Complaint  Patient presents with   Leg Pain   Arm Pain    HPI Cassandra Cooke is a 81 y.o. female.   Patient presents today with several concerns.  She reports a prolonged history of right neck pain with radiculopathy and states that symptoms have worsened in the past several weeks and she is now experiencing severe right wrist pain.  She has previously seen PT but not had imaging.  She denies history of rheumatoid arthritis but does have osteoarthritis.  She denies any recent fall or injury prior to symptom onset.  She has been taking Tylenol without improvement of symptoms.  She is right-handed and having difficulty with daily activities as result of symptoms.  Denies any numbness or paresthesias.  Pain is rated 7 on a 0-10 pain scale, described as aching, worse with movement, no alleviating factors identified.  In addition, patient has a history of chronic right knee pain.  She reports that pain is worse when she is ambulating.  She will occasionally have a popping/clicking sensation but denies instability or recent fall.  Denies any change in activity prior to symptom onset.  Pain is rated 8 on a 0-10 pain scale, localized to anterior right knee, described as aching, no aggravating relieving factors identified.  She is having difficulty with daily tibias as a result of symptoms.  She denies any right leg pain, significant risk factors for VTE event including malignancy, hospitalization, immobilization, COVID-19 infection, recent surgery.  Patient's blood pressure was noted to be elevated.  She has not yet taken her amlodipine today.  She denies any chest pain, shortness of breath, headache, vision change.  She does not take NSAIDs or decongestants.  Denies any increase in caffeine or sodium consumption.  She is working closely with her primary care about this as it was even  more elevated in the past and today's reading is improved.   Past Medical History:  Diagnosis Date   Allergy    Anxiety    during a period of social upheaval   Arthritis    Bloating    Cataract    Colon polyps    Diabetes mellitus type II    Borderline   Diverticulosis of colon    GERD (gastroesophageal reflux disease)    Heartburn    Hiatal hernia    Hypertension    Vitamin D deficiency     Patient Active Problem List   Diagnosis Date Noted   Joint pain 07/04/2019   Shoulder pain 12/30/2018   Vertigo 10/11/2018   Hyperglycemia 06/03/2018   Vaginal dryness 06/03/2018   Foot pain 09/01/2017   Healthcare maintenance 08/09/2016   Pain in knee joint 01/13/2015   Osteopenia 01/16/2014   Medicare annual wellness visit, subsequent 01/10/2014   Advance care planning 01/10/2014   Vitamin D deficiency 06/14/2010   GERD 03/15/2009   UNSPECIFIED MENOPAUSAL&POSTMENOPAUSAL DISORDER 06/01/2008   ANKLE EDEMA, CHRONIC 03/15/2008   LEG CRAMPS 11/18/2006   HYPERCHOLESTEROLEMIA 10/07/2006   OBESITY 10/07/2006   Essential hypertension 10/07/2006   DIVERTICULOSIS, COLON 10/07/2006   CONSTIPATION 10/07/2006    Past Surgical History:  Procedure Laterality Date   CATARACT EXTRACTION     Bil   COLONOSCOPY     DILATION AND CURETTAGE OF UTERUS     1 miscarriage   DILATION AND CURETTAGE OF UTERUS  01/2008  vaginal bleeding (Dr. Ronita Hipps)   No Name   right due to cysts   OOPHORECTOMY  1971   ovarian cystectomy left   POLYPECTOMY     VAGINAL DELIVERY     NSVD x 5    OB History   No obstetric history on file.      Home Medications    Prior to Admission medications   Medication Sig Start Date End Date Taking? Authorizing Provider  predniSONE (DELTASONE) 20 MG tablet Take 1 tablet (20 mg total) by mouth daily. 09/26/21  Yes Jerzie Bieri K, PA-C  albuterol (VENTOLIN HFA) 108 (90 Base) MCG/ACT inhaler Inhale 1-2 puffs into the lungs every 6 (six) hours as needed for  wheezing. 07/02/19   Tonia Ghent, MD  amLODipine (NORVASC) 10 MG tablet Take 1 tablet (10 mg total) by mouth daily. 05/18/21   Tonia Ghent, MD  cetirizine (ZYRTEC) 10 MG tablet Take 1 tablet (10 mg total) by mouth daily. 10/26/18   Loura Halt A, NP  Cholecalciferol (VITAMIN D3) 50 MCG (2000 UT) capsule Take 1 capsule (2,000 Units total) by mouth daily. 08/23/21   Tonia Ghent, MD  fluticasone (FLONASE) 50 MCG/ACT nasal spray Place 1 spray into both nostrils daily. 12/14/18   Tonia Ghent, MD  Loteprednol Etabonate (LOTEMAX SM) 0.38 % GEL Apply to eye.    [provider]  meclizine (ANTIVERT) 25 MG tablet Take 0.5-1 tablets (12.5-25 mg total) by mouth 3 (three) times daily as needed for dizziness (sedation caution). 10/08/18   Tonia Ghent, MD  metoprolol succinate (TOPROL-XL) 50 MG 24 hr tablet Take 2 tablets (100 mg total) by mouth daily. 08/20/21   Tonia Ghent, MD  omeprazole (PRILOSEC) 40 MG capsule Take one capsule by mouth twice a day as needed 01/23/21   Tonia Ghent, MD  traMADol (ULTRAM) 50 MG tablet Take 1 tablet (50 mg total) by mouth every 12 (twelve) hours as needed. 05/18/21   Tonia Ghent, MD    Family History Family History  Problem Relation Age of Onset   Hypertension Mother    Heart disease Father        MI   Hypertension Father    Ulcers Brother        PUD   Alcohol abuse Sister    Alcohol abuse Sister    Colon cancer Son    Heart disease Brother    Heart attack Brother    Breast cancer Neg Hx    Esophageal cancer Neg Hx    Rectal cancer Neg Hx    Stomach cancer Neg Hx     Social History Social History   Tobacco Use   Smoking status: Former    Types: Cigarettes    Quit date: 07/16/1973    Years since quitting: 48.2   Smokeless tobacco: Never   Tobacco comments:    quit over 40 years ago  Substance Use Topics   Alcohol use: No   Drug use: No     Allergies   Aspirin, Hctz [hydrochlorothiazide], and  Lisinopril   Review of Systems Review of Systems  Constitutional:  Positive for activity change. Negative for appetite change, fatigue and fever.  Eyes:  Negative for visual disturbance.  Respiratory:  Negative for cough and shortness of breath.   Cardiovascular:  Negative for chest pain.  Gastrointestinal:  Negative for abdominal pain, diarrhea, nausea and vomiting.  Musculoskeletal:  Positive for arthralgias and gait problem. Negative for joint swelling and  myalgias.  Neurological:  Negative for dizziness, light-headedness and headaches.    Physical Exam Triage Vital Signs ED Triage Vitals  Enc Vitals Group     BP 09/26/21 1345 (!) 174/75     Pulse Rate 09/26/21 1345 76     Resp 09/26/21 1345 17     Temp 09/26/21 1345 98.1 F (36.7 C)     Temp Source 09/26/21 1345 Oral     SpO2 09/26/21 1345 100 %     Weight --      Height --      Head Circumference --      Peak Flow --      Pain Score 09/26/21 1344 7     Pain Loc --      Pain Edu? --      Excl. in [place]? --    No data found.  Updated Vital Signs BP (!) 156/50 (BP Location: Left Arm)   Pulse 76   Temp 98.1 F (36.7 C) (Oral)   Resp 17   SpO2 100%   Visual Acuity Right Eye Distance:   Left Eye Distance:   Bilateral Distance:    Right Eye Near:   Left Eye Near:    Bilateral Near:     Physical Exam Vitals reviewed.  Constitutional:      General: She is awake. She is not in acute distress.    Appearance: Normal appearance. She is well-developed. She is not ill-appearing.     Comments: Very pleasant female appears stated age in no acute distress sitting comfortably in exam room  HENT:     Head: Normocephalic and atraumatic.  Cardiovascular:     Rate and Rhythm: Normal rate and regular rhythm.     Pulses:          Radial pulses are 2+ on the right side and 2+ on the left side.     Heart sounds: Normal heart sounds, S1 normal and S2 normal. No murmur heard. Pulmonary:     Effort: Pulmonary effort is normal.      Breath sounds: Normal breath sounds. No wheezing, rhonchi or rales.     Comments: Clear to auscultation bilaterally Abdominal:     Palpations: Abdomen is soft.     Tenderness: There is no abdominal tenderness.  Musculoskeletal:     Right wrist: Tenderness present. No swelling, bony tenderness or snuff box tenderness. Decreased range of motion.     Comments: Right wrist: Tenderness palpation over radial right wrist.  Decreased range of motion with extension secondary to pain.  No snuffbox tenderness.  Hand neurovascularly intact.  Right knee: Strength 5/5.  Mild tender palpation at lateral inferior knee without deformity.  No ligamentous laxity on exam.  Psychiatric:        Behavior: Behavior is cooperative.     UC Treatments / Results  Labs (all labs ordered are listed, but only abnormal results are displayed) Labs Reviewed - No data to display  EKG   Radiology DG Wrist Complete Right  Result Date: 09/26/2021 CLINICAL DATA:  Right wrist pain and decreased range of motion. EXAM: RIGHT WRIST - COMPLETE 3+ VIEW COMPARISON:  None Available. FINDINGS: There is diffuse soft tissue swelling about the dorsal aspect of the wrist without associated fracture or dislocation. Mild windswept deformity of the second through fifth MCP joints. Joint spaces appear grossly preserved. No erosions. No evidence of chondrocalcinosis. No radiopaque foreign body. IMPRESSION: 1. Nonspecific diffuse soft tissue swelling about the dorsal aspect of the wrist  without associated fracture or dislocation. 2. Nonspecific mild windswept deformity of the second through fifth MCP joints without radiographic evidence of significant degenerative or an inflammatory/crystalline arthropathy. Electronically Signed   By: Sandi Mariscal M.D.   On: 09/26/2021 14:14   DG Knee Complete 4 Views Right  Result Date: 09/26/2021 CLINICAL DATA:  Right knee pain. Decreased range of motion. No known injury. EXAM: RIGHT KNEE - COMPLETE 4+  VIEW COMPARISON:  None Available. FINDINGS: No fracture or dislocation. Moderate to severe tricompartmental degenerative change of the knee, worse within the medial compartment and patellofemoral joints with joint space loss, articular surface irregularity, subchondral sclerosis and osteophytosis. Small amount of chondrocalcinosis is seen within the lateral joint space. No joint effusion. Regional soft tissues appear normal. IMPRESSION: 1. Moderate to severe tricompartmental degenerative change of the knee. 2. 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 Electronically Signed   By: Sandi Mariscal M.D.   On: 09/26/2021 14:15    Procedures Procedures (including critical care time)  Medications Ordered in UC Medications - No data to display  Initial Impression / Assessment and Plan / UC Course  I have reviewed the triage vital signs and the nursing notes.  Pertinent labs & imaging results that were available during my care of the patient were reviewed by me and considered in my medical decision making (see chart for details).     X-rays obtained given prolonged and worsening symptoms showed arthritic changes with potential evidence for pseudogout.  Recommend patient follow-up with orthopedics and was given contact information for local provider with instruction to call to schedule an appointment.  She is unable to take NSAIDs so we will use low-dose prednisone to help manage symptoms.  She has type 2 diabetes listed as a problem on her medical history, however, last A1c 11/16/2020 was appropriate at 6.3%.  Discussed that this can cause elevated blood sugars and she should avoid carbohydrates and drink lots of fluid while on this medication.  She can continue her Tylenol for pain relief.  Recommended heat and gentle stretch for symptom improvement.  If she has any worsening symptoms she is to return for reevaluation.  Blood pressure is elevated today.  She denies  any signs/symptoms of endorgan damage.  Recommended she go home and take her medication as prescribed.  She is to monitor this at home and if she develops any chest pain, shortness of breath, headache, vision change in setting of high blood pressure she is to go to the emergency room immediately.  Recommended follow-up with PCP next week for further evaluation and management.  Final Clinical Impressions(s) / UC Diagnoses   Final diagnoses:  Arthritis of right knee  Arthritis of right wrist  Elevated blood pressure reading with diagnosis of hypertension     Discharge Instructions      Your x-rays showed arthritis.  I do think you should follow-up with orthopedics.  Please call to schedule appointment.  Since you cannot take NSAIDs we are going to use a low-dose of prednisone.  Take 1 tablet in the morning for the next 3 days.  Use heat and stretch for symptom relief.  Continue your Tylenol as previously recommended.  If anything worsens please return for reevaluation.  Your blood pressure is elevated.  Please take your medication when you get home.  Monitor this at home.  If you develop any chest pain, shortness of breath, headache, vision change, dizziness in the setting of high  blood pressure you need to go to the emergency room immediately.     ED Prescriptions     Medication Sig Dispense Auth. Provider   predniSONE (DELTASONE) 20 MG tablet Take 1 tablet (20 mg total) by mouth daily. 3 tablet Johan Antonacci, Derry Skill, PA-C      PDMP not reviewed this encounter.   Terrilee Croak, PA-C 09/26/21 1431

## 2021-09-26 NOTE — Discharge Instructions (Signed)
Your x-rays showed arthritis.  I do think you should follow-up with orthopedics.  Please call to schedule appointment.  Since you cannot take NSAIDs we are going to use a low-dose of prednisone.  Take 1 tablet in the morning for the next 3 days.  Use heat and stretch for symptom relief.  Continue your Tylenol as previously recommended.  If anything worsens please return for reevaluation.  Your blood pressure is elevated.  Please take your medication when you get home.  Monitor this at home.  If you develop any chest pain, shortness of breath, headache, vision change, dizziness in the setting of high blood pressure you need to go to the emergency room immediately.

## 2021-09-26 NOTE — ED Triage Notes (Signed)
Pt reports had right leg pains for "while" and had therapy on it before. Pt reports having right arm and hand pains and swelling for over 2 weeks. Denies any injuries.

## 2021-11-01 ENCOUNTER — Ambulatory Visit (INDEPENDENT_AMBULATORY_CARE_PROVIDER_SITE_OTHER): Payer: Medicare PPO

## 2021-11-01 ENCOUNTER — Ambulatory Visit
Admission: EM | Admit: 2021-11-01 | Discharge: 2021-11-01 | Disposition: A | Discharge status: Home / Self Care | Payer: Medicare PPO | Attending: Emergency Medicine | Admitting: Emergency Medicine

## 2021-11-01 DIAGNOSIS — M67431 Ganglion, right wrist: Secondary | ICD-10-CM

## 2021-11-01 DIAGNOSIS — M79641 Pain in right hand: Secondary | ICD-10-CM | POA: Diagnosis not present

## 2021-11-01 DIAGNOSIS — M7989 Other specified soft tissue disorders: Secondary | ICD-10-CM | POA: Diagnosis not present

## 2021-11-01 NOTE — ED Triage Notes (Signed)
Patient presents to Urgent Care with complaints of right hand pain and swelling since earlier this week. Patient reports tylenol for pain and discomfort.

## 2021-11-01 NOTE — Discharge Instructions (Addendum)
I have put in a referral to Dr. Grandville Silos.  You should be hearing from his office soon.  However, I have given you a list of other hand surgeons here in [place].  They are all excellent.  You can take 500 to 1000 mg of Tylenol 3 times a day as needed for pain.  Ace wrap as needed for comfort.

## 2021-11-01 NOTE — ED Provider Notes (Signed)
HPI  SUBJECTIVE:  Cassandra Cooke is a right-handed 81 y.o. female who presents with painful mass of gradually increasing size on the dorsum of her right wrist starting a month ago.  She states that the swelling is worse in the morning.  She reports pain with wrist extension that radiates up her arm.  No trauma to the area, overlying erythema, fevers, distal numbness or tingling.  She cannot characterize the pain.  She states this is similar to when she had a left-sided ganglion cyst many years ago.  She tried Tylenol and Ace wrap with improvement in her symptoms.  Symptoms are worse with wrist extension.  She has a past medical history of hypertension, diabetes, left ganglion cyst that she ruptured at home.  No history of peripheral neuropathy.  PCP: [facility].     Past Medical History:  Diagnosis Date   Allergy    Anxiety    during a period of social upheaval   Arthritis    Bloating    Cataract    Colon polyps    Diabetes mellitus type II    Borderline   Diverticulosis of colon    GERD (gastroesophageal reflux disease)    Heartburn    Hiatal hernia    Hypertension    Vitamin D deficiency     Past Surgical History:  Procedure Laterality Date   CATARACT EXTRACTION     Bil   COLONOSCOPY     DILATION AND CURETTAGE OF UTERUS     1 miscarriage   DILATION AND CURETTAGE OF UTERUS  01/2008   vaginal bleeding (Dr. Ronita Hipps)   [place]   right due to cysts   OOPHORECTOMY  1971   ovarian cystectomy left   POLYPECTOMY     VAGINAL DELIVERY     NSVD x 5    Family History  Problem Relation Age of Onset   Hypertension Mother    Heart disease Father        MI   Hypertension Father    Ulcers Brother        PUD   Alcohol abuse Sister    Alcohol abuse Sister    Colon cancer Son    Heart disease Brother    Heart attack Brother    Breast cancer Neg Hx    Esophageal cancer Neg Hx    Rectal cancer Neg Hx    Stomach cancer Neg Hx     Social History   Tobacco Use    Smoking status: Former    Types: Cigarettes    Quit date: 07/16/1973    Years since quitting: 48.3   Smokeless tobacco: Never   Tobacco comments:    quit over 40 years ago  Substance Use Topics   Alcohol use: No   Drug use: No    No current facility-administered medications for this encounter.  Current Outpatient Medications:    albuterol (VENTOLIN HFA) 108 (90 Base) MCG/ACT inhaler, Inhale 1-2 puffs into the lungs every 6 (six) hours as needed for wheezing., Disp: 18 g, Rfl: 1   amLODipine (NORVASC) 10 MG tablet, Take 1 tablet (10 mg total) by mouth daily., Disp: 90 tablet, Rfl: 3   cetirizine (ZYRTEC) 10 MG tablet, Take 1 tablet (10 mg total) by mouth daily., Disp: 30 tablet, Rfl: 0   Cholecalciferol (VITAMIN D3) 50 MCG (2000 UT) capsule, Take 1 capsule (2,000 Units total) by mouth daily., Disp: , Rfl:    fluticasone (FLONASE) 50 MCG/ACT nasal spray, Place 1 spray  into both nostrils daily., Disp: 16 g, Rfl: 1   Loteprednol Etabonate (LOTEMAX SM) 0.38 % GEL, Apply to eye., Disp: , Rfl:    meclizine (ANTIVERT) 25 MG tablet, Take 0.5-1 tablets (12.5-25 mg total) by mouth 3 (three) times daily as needed for dizziness (sedation caution)., Disp: 30 tablet, Rfl: 0   metoprolol succinate (TOPROL-XL) 50 MG 24 hr tablet, Take 2 tablets (100 mg total) by mouth daily., Disp: 60 tablet, Rfl: 3   omeprazole (PRILOSEC) 40 MG capsule, Take one capsule by mouth twice a day as needed, Disp: 180 capsule, Rfl: 3  Allergies  Allergen Reactions   Aspirin     REACTION: GI upset with high dose   Hctz [Hydrochlorothiazide] Other (See Comments)    cramping   Lisinopril     Causes cough     ROS  As noted in HPI.   Physical Exam  BP (!) 155/79   Pulse 87   Temp 98 F (36.7 C)   Resp 18   SpO2 95%   BP Readings from Last 3 Encounters:  11/01/21 (!) 155/79  09/26/21 (!) 156/50  05/18/21 (!) 150/70     Constitutional: Well developed, well nourished, no acute distress Eyes:  EOMI,  conjunctiva normal bilaterally HENT: Normocephalic, atraumatic,mucus membranes moist Respiratory: Normal inspiratory effort Cardiovascular: Normal rate GI: nondistended skin: No rash, skin intact Musculoskeletal: Soft tissue swelling dorsum right wrist without overlying erythema.  Pain with wrist extension.  No pain with wrist flexion, radial/ulnar deviation, supination, pronation.  Hand nontender.  RP 2+.  Able to move all fingers.  Sensation grossly intact in all fingers Neurologic: Alert & oriented x 3, no focal neuro deficits Psychiatric: Speech and behavior appropriate   ED Course   Medications - No data to display  Orders Placed This Encounter  Procedures   DG Hand Complete Right    Standing Status:   Standing    Number of Occurrences:   1    Order Specific Question:   Reason for Exam (SYMPTOM  OR DIAGNOSIS REQUIRED)    Answer:   swelling   AMB referral to orthopedics    Referral Priority:   Routine    Referral Type:   Consultation    Referred to Provider:   Milly Jakob, MD    Number of Visits Requested:   1    No results found for this or any previous visit (from the past 24 hour(s)). DG Hand Complete Right  Result Date: 11/01/2021 CLINICAL DATA:  Swelling. EXAM: RIGHT HAND - COMPLETE 3+ VIEW COMPARISON:  Right wrist x-ray 09/26/2021 FINDINGS: There is no evidence of fracture or dislocation. There is no evidence of arthropathy or other focal bone abnormality. There is soft tissue swelling of the posterior wrist. IMPRESSION: 1. Soft tissue swelling of the posterior wrist. 2. No acute bony abnormality. Electronically Signed   By: Ronney Asters M.D.   On: 11/01/2021 16:17    ED Clinical Impression  1. Ganglion cyst of dorsum of right wrist      ED Assessment/Plan  Reviewed imaging independently.  Soft tissue swelling posterior wrist.  No acute bony abnormalities.  See radiology report for details  Patient consistent with a ganglion cyst.  No evidence of infection.   X-ray negative.  Will place an Ace wrap here, she may take Tylenol 500 to 1000 mg 3-4 times a day.  will place a referral to hand surgery.  Discussed  imaging, MDM, treatment plan, and plan for follow-up with patient. patient  agrees with plan.   No orders of the defined types were placed in this encounter.     *This clinic note was created using Dragon dictation software. Therefore, there may be occasional mistakes despite careful proofreading.  ?    Melynda Ripple, MD 11/01/21 1757

## 2021-11-16 ENCOUNTER — Other Ambulatory Visit: Payer: Self-pay | Admitting: Family Medicine

## 2021-11-29 ENCOUNTER — Telehealth: Payer: Self-pay | Admitting: Family Medicine

## 2021-11-29 NOTE — Telephone Encounter (Signed)
Left message for patient to call back and schedule Medicare Annual Wellness Visit (AWV).   Please offer to do virtually or by telephone.   Last AWV:11/30/2020  Please schedule at anytime with [facility] schedule 2  30 minute appointent  If any questions, please contact me at [phone]

## 2022-01-24 ENCOUNTER — Ambulatory Visit: Payer: Self-pay | Admitting: *Deleted

## 2022-01-24 NOTE — Telephone Encounter (Signed)
Pt is calling to ask what two red lines mean on a COVID test. Pt is not aware of any instructions on the test.       Attempted to reach pt. Unable to leave message, voice mail full.

## 2022-01-25 ENCOUNTER — Ambulatory Visit: Admission: EM | Admit: 2022-01-25 | Discharge: 2022-01-25 | Discharge status: Absent without leave | Payer: Medicare PPO

## 2022-01-25 ENCOUNTER — Telehealth: Payer: Self-pay | Admitting: Family Medicine

## 2022-01-25 NOTE — Telephone Encounter (Signed)
Attempted to reach pt. Mailbox is full, unable to leave message.

## 2022-01-25 NOTE — Telephone Encounter (Signed)
Left message to return call to our office.  

## 2022-01-25 NOTE — Telephone Encounter (Signed)
Patient called in stating that she has tested positive for covid. Right now she's just experiencing a cough and weakness. No fever. She would like to know if she could get a rx called in for her ?

## 2022-01-25 NOTE — Telephone Encounter (Signed)
Mail box full if patient calls back please see if we can put her on for virtual with open provider.

## 2022-01-25 NOTE — Telephone Encounter (Signed)
Patient called back we did not have anything open for virtual in our office. Waked patient through how to do a virtual with [facility]. If she is not able to be seen that way she will go to urgent care.

## 2022-01-25 NOTE — Telephone Encounter (Signed)
Summary: COVID Test Advice   Pt is calling to ask what two red lines mean on a COVID test. Pt is not aware of any instructions on the test.     Mailbox is full - cannot leave a message.

## 2022-01-25 NOTE — Telephone Encounter (Signed)
Patient called back in and returning a call she received. Thank you!

## 2022-01-26 ENCOUNTER — Encounter (HOSPITAL_COMMUNITY): Payer: Self-pay | Admitting: *Deleted

## 2022-01-26 ENCOUNTER — Other Ambulatory Visit: Payer: Self-pay

## 2022-01-26 ENCOUNTER — Ambulatory Visit: Admission: EM | Admit: 2022-01-26 | Discharge: 2022-01-26 | Discharge status: Absent without leave | Payer: Medicare PPO

## 2022-01-26 ENCOUNTER — Ambulatory Visit (HOSPITAL_COMMUNITY)
Admission: EM | Admit: 2022-01-26 | Discharge: 2022-01-26 | Disposition: A | Discharge status: Home / Self Care | Payer: Medicare PPO | Attending: Family Medicine | Admitting: Family Medicine

## 2022-01-26 DIAGNOSIS — U071 COVID-19: Secondary | ICD-10-CM | POA: Diagnosis not present

## 2022-01-26 DIAGNOSIS — J069 Acute upper respiratory infection, unspecified: Secondary | ICD-10-CM

## 2022-01-26 MED ORDER — HYDROCODONE BIT-HOMATROP MBR 5-1.5 MG/5ML PO SOLN: 5.0000 mL | Freq: Four times a day (QID) | ORAL | 0 refills | Status: DC | PRN

## 2022-01-26 NOTE — Discharge Instructions (Addendum)
Be aware, your cough medication may cause drowsiness. Please do not drive, operate heavy machinery or make important decisions while on this medication, it can cloud your judgement.  

## 2022-01-26 NOTE — ED Provider Notes (Signed)
[facility]   [id number] 01/26/22 Arrival Time: 1010  ASSESSMENT & PLAN:  1. Viral URI with cough   2. COVID-19 virus infection    Day # 5-6; no indication for anti-virals. She is feeling a bit better. Discussed typical duration of viral illnesses. OTC symptom care as needed.  Discharge Medication List as of 01/26/2022 11:46 AM     START taking these medications   Details  HYDROcodone bit-homatropine (HYCODAN) 5-1.5 MG/5ML syrup Take 5 mLs by mouth every 6 (six) hours as needed for cough., Starting Sat 01/26/2022, Normal         Follow-up Information     Tonia Ghent, MD.   Specialty: Family Medicine Why: As needed. Contact information: [place] [address] [phone]                 Reviewed expectations re: course of current medical issues. Questions answered. Outlined signs and symptoms indicating need for more acute intervention. Understanding verbalized. After Visit Summary given.   SUBJECTIVE: History from: Patient. Cassandra Cooke is a 81 y.o. female. Reports: nasal congestion, cough, fatigue, body aches; x 5-6 days "maybe longer". Denies: fever and difficulty breathing. Normal PO intake without n/v/d. Cough is now affecting sleep. No SOB/CP.  OBJECTIVE:  Vitals:   01/26/22 1103  BP: (!) 147/61  Pulse: 69  Resp: 20  Temp: 98.1 F (36.7 C)  SpO2: 100%    General appearance: alert; no distress Eyes: PERRLA; EOMI; conjunctiva normal HENT: [place]; AT; with nasal congestion Neck: supple  Lungs: speaks full sentences without difficulty; unlabored; CTAB Extremities: no edema Skin: warm and dry Neurologic: normal gait Psychological: alert and cooperative; normal mood and affect  Allergies  Allergen Reactions   Aspirin     REACTION: GI upset with high dose   Hctz [Hydrochlorothiazide] Other (See Comments)    cramping   Lisinopril     Causes cough    Past Medical History:  Diagnosis Date   Allergy     Anxiety    during a period of social upheaval   Arthritis    Bloating    Cataract    Colon polyps    Diabetes mellitus type II    Borderline   Diverticulosis of colon    GERD (gastroesophageal reflux disease)    Heartburn    Hiatal hernia    Hypertension    Vitamin D deficiency    Social History   Socioeconomic History   Marital status: Widowed    Spouse name: Not on file   Number of children: 5   Years of education: Not on file   Highest education level: Not on file  Occupational History   Occupation: Retired    Fish farm manager: RETIRED  Tobacco Use   Smoking status: Former    Types: Cigarettes    Quit date: 07/16/1973    Years since quitting: 48.5   Smokeless tobacco: Never   Tobacco comments:    quit over 40 years ago  Substance and Sexual Activity   Alcohol use: No   Drug use: No   Sexual activity: Not Currently    Birth control/protection: None    Comment: Widow  Other Topics Concern   Not on file  Social History Narrative   Remarried 2020/11/05.      1st husband died of cancer.    Widowed 07-26-11, he had prostate cancer.    H/o mult foster children (none as of 25-Jul-2020)      1  daughter died of renal disease   1 son with h/o colon cancer   2 other sons and 1 daughter healthy   Social Determinants of Health   Financial Resource Strain: Low Risk  (11/30/2020)   Overall Financial Resource Strain (CARDIA)    Difficulty of Paying Living Expenses: Not hard at all  Food Insecurity: No Food Insecurity (11/30/2020)   Hunger Vital Sign    Worried About Running Out of Food in the Last Year: Never true    Ran Out of Food in the Last Year: Never true  Transportation Needs: No Transportation Needs (11/30/2020)   PRAPARE - Hydrologist (Medical): No    Lack of Transportation (Non-Medical): No  Physical Activity: Inactive (11/30/2020)   Exercise Vital Sign    Days of Exercise per Week: 0 days    Minutes of Exercise per Session: 0 min  Stress: No  Stress Concern Present (11/30/2020)   [place]    Feeling of Stress : Not at all  Social Connections: Not on file  Intimate Partner Violence: Not At Risk (11/30/2020)   Humiliation, Afraid, Rape, and Kick questionnaire    Fear of Current or Ex-Partner: No    Emotionally Abused: No    Physically Abused: No    Sexually Abused: No   Family History  Problem Relation Age of Onset   Hypertension Mother    Heart disease Father        MI   Hypertension Father    Ulcers Brother        PUD   Alcohol abuse Sister    Alcohol abuse Sister    Colon cancer Son    Heart disease Brother    Heart attack Brother    Breast cancer Neg Hx    Esophageal cancer Neg Hx    Rectal cancer Neg Hx    Stomach cancer Neg Hx    Past Surgical History:  Procedure Laterality Date   CATARACT EXTRACTION     Bil   COLONOSCOPY     DILATION AND CURETTAGE OF UTERUS     1 miscarriage   DILATION AND CURETTAGE OF UTERUS  01/2008   vaginal bleeding (Dr. Ronita Hipps)   [employer]   right due to cysts   OOPHORECTOMY  1971   ovarian cystectomy left   POLYPECTOMY     VAGINAL DELIVERY     NSVD x 5     Vanessa Kick, MD 01/26/22 1256

## 2022-01-26 NOTE — ED Triage Notes (Signed)
Pt reports a positive home COVID test on Thursday and Friday. Pt reports cough ,fatigue ,sore throat S X's started Monday evening.

## 2022-01-29 ENCOUNTER — Other Ambulatory Visit: Payer: Self-pay | Admitting: Family Medicine

## 2022-02-13 ENCOUNTER — Other Ambulatory Visit: Payer: Self-pay | Admitting: Family Medicine

## 2022-02-25 ENCOUNTER — Ambulatory Visit (INDEPENDENT_AMBULATORY_CARE_PROVIDER_SITE_OTHER): Payer: Medicare PPO

## 2022-02-25 VITALS — Ht 63.0 in | Wt 198.0 lb

## 2022-02-25 DIAGNOSIS — Z0000 Encounter for general adult medical examination without abnormal findings: Secondary | ICD-10-CM | POA: Diagnosis not present

## 2022-02-25 NOTE — Progress Notes (Signed)
I connected with Cassandra Cooke today by telephone and verified that I am speaking with the correct person using two identifiers. Location patient: home Location provider: work Persons participating in the virtual visit: Trissa, Molina LPN.   I discussed the limitations, risks, security and privacy concerns of performing an evaluation and management service by telephone and the availability of in person appointments. I also discussed with the patient that there may be a patient responsible charge related to this service. The patient expressed understanding and verbally consented to this telephonic visit.    Interactive audio and video telecommunications were attempted between this provider and patient, however failed, due to patient having technical difficulties OR patient did not have access to video capability.  We continued and completed visit with audio only.     Vital signs may be patient reported or missing.  Subjective:   Cassandra Cooke is a 81 y.o. female who presents for Medicare Annual (Subsequent) preventive examination.  Review of Systems     Cardiac Risk Factors include: advanced age (>68mn, >>33women);hypertension;obesity (BMI >30kg/m2)     Objective:    Today's Vitals   02/25/22 1611  Weight: 198 lb (89.8 kg)  Height: '5\' 3"'$  (1.6 m)   Body mass index is 35.07 kg/m.     02/25/2022    4:20 PM 06/21/2021   10:14 AM 11/30/2020   12:08 PM 06/29/2019   11:36 AM 10/02/2018   12:45 PM 07/29/2016   11:57 AM  Advanced Directives  Does Patient Have a Medical Advance Directive? Yes Yes Yes No No No  Type of AParamedicof [place] will Living will;Healthcare Power of [place] will     Does patient want to make changes to medical advance directive?  No - Patient declined      Copy of [place]in Chart? No - copy requested Yes - validated most recent copy scanned in chart  (See row information) No - copy requested     Would patient like information on creating a medical advance directive?    No - Patient declined No - Patient declined     Current Medications (verified) Outpatient Encounter Medications as of 02/25/2022  Medication Sig   amLODipine (NORVASC) 10 MG tablet Take 1 tablet (10 mg total) by mouth daily.   Cholecalciferol (VITAMIN D3) 50 MCG (2000 UT) capsule Take 1 capsule (2,000 Units total) by mouth daily.   fluticasone (FLONASE) 50 MCG/ACT nasal spray Place 1 spray into both nostrils daily.   HYDROcodone bit-homatropine (HYCODAN) 5-1.5 MG/5ML syrup Take 5 mLs by mouth every 6 (six) hours as needed for cough.   Loteprednol Etabonate (LOTEMAX SM) 0.38 % GEL Apply to eye.   metoprolol succinate (TOPROL-XL) 50 MG 24 hr tablet TAKE 2 TABLETS BY MOUTH EVERY DAY   omeprazole (PRILOSEC) 40 MG capsule TAKE 1 CAPSULE BY MOUTH TWICE A DAY AS NEEDED   albuterol (VENTOLIN HFA) 108 (90 Base) MCG/ACT inhaler Inhale 1-2 puffs into the lungs every 6 (six) hours as needed for wheezing. (Patient not taking: Reported on 02/25/2022)   cetirizine (ZYRTEC) 10 MG tablet Take 1 tablet (10 mg total) by mouth daily. (Patient not taking: Reported on 02/25/2022)   meclizine (ANTIVERT) 25 MG tablet Take 0.5-1 tablets (12.5-25 mg total) by mouth 3 (three) times daily as needed for dizziness (sedation caution). (Patient not taking: Reported on 02/25/2022)   No facility-administered encounter medications on file as of 02/25/2022.  Allergies (verified) Aspirin, Hctz [hydrochlorothiazide], and Lisinopril   History: Past Medical History:  Diagnosis Date   Allergy    Anxiety    during a period of social upheaval   Arthritis    Bloating    Cataract    Colon polyps    Diabetes mellitus type II    Borderline   Diverticulosis of colon    GERD (gastroesophageal reflux disease)    Heartburn    Hiatal hernia    Hypertension    Vitamin D deficiency    Past Surgical  History:  Procedure Laterality Date   CATARACT EXTRACTION     Bil   COLONOSCOPY     DILATION AND CURETTAGE OF UTERUS     1 miscarriage   DILATION AND CURETTAGE OF UTERUS  01/2008   vaginal bleeding (Dr. Ronita Hipps)   [place]   right due to cysts   OOPHORECTOMY  July 25, 1969   ovarian cystectomy left   POLYPECTOMY     VAGINAL DELIVERY     NSVD x 5   Family History  Problem Relation Age of Onset   Hypertension Mother    Heart disease Father        MI   Hypertension Father    Ulcers Brother        PUD   Alcohol abuse Sister    Alcohol abuse Sister    Colon cancer Son    Heart disease Brother    Heart attack Brother    Breast cancer Neg Hx    Esophageal cancer Neg Hx    Rectal cancer Neg Hx    Stomach cancer Neg Hx    Social History   Socioeconomic History   Marital status: Widowed    Spouse name: Not on file   Number of children: 5   Years of education: Not on file   Highest education level: Not on file  Occupational History   Occupation: Retired    Fish farm manager: RETIRED  Tobacco Use   Smoking status: Former    Types: Cigarettes    Quit date: 07/16/1973    Years since quitting: 48.6   Smokeless tobacco: Never   Tobacco comments:    quit over 40 years ago  Vaping Use   Vaping Use: Never used  Substance and Sexual Activity   Alcohol use: No   Drug use: No   Sexual activity: Not Currently    Birth control/protection: None    Comment: Widow  Other Topics Concern   Not on file  Social History Narrative   Remarried 11-05-2020.      1st husband died of cancer.    Widowed 07/26/11, he had prostate cancer.    H/o mult foster children (none as of July 25, 2020)      1 daughter died of renal disease   1 son with h/o colon cancer   2 other sons and 1 daughter healthy   Social Determinants of Health   Financial Resource Strain: Low Risk  (02/25/2022)   Overall Financial Resource Strain (CARDIA)    Difficulty of Paying Living Expenses: Not hard at all  Food Insecurity: No  Food Insecurity (02/25/2022)   Hunger Vital Sign    Worried About Running Out of Food in the Last Year: Never true    Ran Out of Food in the Last Year: Never true  Transportation Needs: No Transportation Needs (02/25/2022)   PRAPARE - Hydrologist (Medical): No    Lack of Transportation (Non-Medical): No  Physical Activity: Inactive (02/25/2022)   Exercise Vital Sign    Days of Exercise per Week: 0 days    Minutes of Exercise per Session: 0 min  Stress: No Stress Concern Present (02/25/2022)   [place]    Feeling of Stress : Only a little  Social Connections: Not on file    Tobacco Counseling Counseling given: Not Answered Tobacco comments: quit over 40 years ago   Clinical Intake:  Pre-visit preparation completed: Yes  Pain : No/denies pain     Nutritional Status: BMI > 30  Obese Nutritional Risks: None Diabetes: No  How often do you need to have someone help you when you read instructions, pamphlets, or other written materials from your doctor or pharmacy?: 1 - Never What is the last grade level you completed in school?: 12th grade  Diabetic? no  Interpreter Needed?: No  Information entered by :: NAllen LPN   Activities of Daily Living    02/25/2022    4:22 PM  In your present state of health, do you have any difficulty performing the following activities:  Hearing? 0  Vision? 0  Difficulty concentrating or making decisions? 0  Walking or climbing stairs? 1  Comment gets short winded  Dressing or bathing? 0  Doing errands, shopping? 0  Preparing Food and eating ? N  Using the Toilet? N  In the past six months, have you accidently leaked urine? Y  Do you have problems with loss of bowel control? N  Managing your Medications? N  Managing your Finances? N  Housekeeping or managing your Housekeeping? N    Patient Care Team: Tonia Ghent, MD as PCP -  General (Family Medicine)  Indicate any recent Medical Services you may have received from other than [facility] providers in the past year (date may be approximate).     Assessment:   This is a routine wellness examination for [place].  Hearing/Vision screen Vision Screening - Comments:: Regular eye exams, [facility]  Dietary issues and exercise activities discussed: Current Exercise Habits: The patient does not participate in regular exercise at present   Goals Addressed             This Visit's Progress    Patient Stated       02/25/2022, wants to lose weight in a healthy way       Depression Screen    02/25/2022    4:21 PM 11/30/2020   12:09 PM 11/16/2020    9:34 AM 06/29/2019   11:38 AM 09/01/2017    8:35 AM 07/29/2016   11:57 AM 07/18/2015   10:06 AM  PHQ 2/9 Scores  PHQ - 2 Score 0 0 0 0 0 0 0  PHQ- 9 Score  0 1 0       Fall Risk    02/25/2022    4:21 PM 11/30/2020   12:09 PM 11/16/2020    9:33 AM 06/29/2019   11:37 AM 09/01/2017    8:35 AM  Fall Risk   Falls in the past year? 0 0 0 0 No  Number falls in past yr: 0 0 0 0   Injury with Fall? 0 0 0 0   Risk for fall due to : Medication side effect;Impaired balance/gait Medication side effect  Impaired balance/gait   Follow up Falls prevention discussed;Education provided;Falls evaluation completed Falls evaluation completed;Falls prevention discussed  Falls evaluation completed;Falls prevention discussed     FALL RISK PREVENTION PERTAINING  TO THE HOME:  Any stairs in or around the home? Yes  If so, are there any without handrails? No  Home free of loose throw rugs in walkways, pet beds, electrical cords, etc? Yes  Adequate lighting in your home to reduce risk of falls? Yes   ASSISTIVE DEVICES UTILIZED TO PREVENT FALLS:  Life alert? No  Use of a cane, walker or w/c? Yes  Grab bars in the bathroom? Yes  Shower chair or bench in shower? Yes  Elevated toilet seat or a handicapped toilet? Yes   TIMED UP AND  GO:  Was the test performed? No .      Cognitive Function:    11/30/2020   12:16 PM 06/29/2019   11:39 AM 07/29/2016   12:00 PM  MMSE - Mini Mental State Exam  Orientation to time '5 5 5  '$ Orientation to Place '5 5 5  '$ Registration '3 3 3  '$ Attention/ Calculation 5 5 0  Recall '3 3 3  '$ Language- name 2 objects   0  Language- repeat '1 1 1  '$ Language- follow 3 step command   3  Language- read & follow direction   0  Write a sentence   0  Copy design   0  Total score   20        02/25/2022    4:23 PM  6CIT Screen  What Year? 0 points  What month? 0 points  What time? 0 points  Count back from 20 0 points  Months in reverse 0 points  Repeat phrase 0 points  Total Score 0 points    Immunizations Immunization History  Administered Date(s) Administered   Influenza Whole 03/06/2005   Influenza, High Dose Seasonal PF 04/08/2018   Influenza,inj,Quad PF,6+ Mos 01/07/2014, 01/12/2015   Influenza-Unspecified 05/20/2016, 01/31/2021   PFIZER Comirnaty(Gray Top)Covid-19 Tri-Sucrose Vaccine 04/27/2020, 08/29/2020, 09/19/2020   Pneumococcal Conjugate-13 08/08/2016   Pneumococcal Polysaccharide-23 08/29/2017   Td 03/06/2005, 01/25/2013    TDAP status: Up to date  Flu Vaccine status: Due, Education has been provided regarding the importance of this vaccine. Advised may receive this vaccine at local pharmacy or Health Dept. Aware to provide a copy of the vaccination record if obtained from local pharmacy or Health Dept. Verbalized acceptance and understanding.  Pneumococcal vaccine status: Up to date  Covid-19 vaccine status: Completed vaccines  Qualifies for Shingles Vaccine? Yes   Zostavax completed No   Shingrix Completed?: No.    Education has been provided regarding the importance of this vaccine. Patient has been advised to call insurance company to determine out of pocket expense if they have not yet received this vaccine. Advised may also receive vaccine at local pharmacy or  Health Dept. Verbalized acceptance and understanding.  Screening Tests Health Maintenance  Topic Date Due   Hepatitis C Screening  Never done   Zoster Vaccines- Shingrix (1 of 2) Never done   COVID-19 Vaccine (4 - Pfizer series) 11/14/2020   INFLUENZA VACCINE  12/04/2021   MAMMOGRAM  02/20/2022   COLONOSCOPY (Pts 45-59yr Insurance coverage will need to be confirmed)  01/22/2023   TETANUS/TDAP  01/26/2023   Pneumonia Vaccine 81 Years old  Completed   DEXA SCAN  Completed   HPV VACCINES  Aged Out    Health Maintenance  Health Maintenance Due  Topic Date Due   Hepatitis C Screening  Never done   Zoster Vaccines- Shingrix (1 of 2) Never done   COVID-19 Vaccine (4 - Pfizer series) 11/14/2020   INFLUENZA  VACCINE  12/04/2021   MAMMOGRAM  02/20/2022    Colorectal cancer screening: Type of screening: Colonoscopy. Completed 01/21/2018. Repeat every 5 years  Mammogram status: patient to schedule  Bone Density status: Completed 01/15/2021.   Lung Cancer Screening: (Low Dose CT Chest recommended if Age 55-80 years, 30 pack-year currently smoking OR have quit w/in 15years.) does not qualify.   Lung Cancer Screening Referral: no  Additional Screening:  Hepatitis C Screening: does qualify;   Vision Screening: Recommended annual ophthalmology exams for early detection of glaucoma and other disorders of the eye. Is the patient up to date with their annual eye exam?  Yes  Who is the provider or what is the name of the office in which the patient attends annual eye exams? [facility] If pt is not established with a provider, would they like to be referred to a provider to establish care? No .   Dental Screening: Recommended annual dental exams for proper oral hygiene  Community Resource Referral / Chronic Care Management: CRR required this visit?  No   CCM required this visit?  No      Plan:     I have personally reviewed and noted the following in the patient's chart:    Medical and social history Use of alcohol, tobacco or illicit drugs  Current medications and supplements including opioid prescriptions. Patient is not currently taking opioid prescriptions. Functional ability and status Nutritional status Physical activity Advanced directives List of other physicians Hospitalizations, surgeries, and ER visits in previous 12 months Vitals Screenings to include cognitive, depression, and falls Referrals and appointments  In addition, I have reviewed and discussed with patient certain preventive protocols, quality metrics, and best practice recommendations. A written personalized care plan for preventive services as well as general preventive health recommendations were provided to patient.     Kellie Simmering, LPN   89/38/1017   Nurse Notes: has not been feeling well overall. Appointment made with Dr. Damita Dunnings for Friday  Due to this being a virtual visit, the after visit summary with patients personalized plan was offered to patient via mail or my-chart.  to pick up at office at next visit

## 2022-02-25 NOTE — Patient Instructions (Signed)
[name] , Thank you for taking time to come for your Medicare Wellness Visit. I appreciate your ongoing commitment to your health goals. Please review the following plan we discussed and let me know if I can assist you in the future.   Screening recommendations/referrals: Colonoscopy: not required Mammogram: patient to schedule Bone Density: completed 01/15/2021 Recommended yearly ophthalmology/optometry visit for glaucoma screening and checkup Recommended yearly dental visit for hygiene and checkup  Vaccinations: Influenza vaccine: due Pneumococcal vaccine: completed 08/29/2017 Tdap vaccine: completed 01/25/2013, due 01/26/2023 Shingles vaccine: discussed   Covid-19: 09/19/2020, 08/29/2020, 04/27/2020  Advanced directives: Please bring a copy of your POA (Power of Attorney) and/or Living Will to your next appointment.   Conditions/risks identified: none  Next appointment: Follow up in one year for your annual wellness visit    Preventive Care 65 Years and Older, Female Preventive care refers to lifestyle choices and visits with your health care provider that can promote health and wellness. What does preventive care include? A yearly physical exam. This is also called an annual well check. Dental exams once or twice a year. Routine eye exams. Ask your health care provider how often you should have your eyes checked. Personal lifestyle choices, including: Daily care of your teeth and gums. Regular physical activity. Eating a healthy diet. Avoiding tobacco and drug use. Limiting alcohol use. Practicing safe sex. Taking low-dose aspirin every day. Taking vitamin and mineral supplements as recommended by your health care provider. What happens during an annual well check? The services and screenings done by your health care provider during your annual well check will depend on your age, overall health, lifestyle risk factors, and family history of disease. Counseling  Your health  care provider may ask you questions about your: Alcohol use. Tobacco use. Drug use. Emotional well-being. Home and relationship well-being. Sexual activity. Eating habits. History of falls. Memory and ability to understand (cognition). Work and work Statistician. Reproductive health. Screening  You may have the following tests or measurements: Height, weight, and BMI. Blood pressure. Lipid and cholesterol levels. These may be checked every 5 years, or more frequently if you are over 48 years old. Skin check. Lung cancer screening. You may have this screening every year starting at age 70 if you have a 30-pack-year history of smoking and currently smoke or have quit within the past 15 years. Fecal occult blood test (FOBT) of the stool. You may have this test every year starting at age 59. Flexible sigmoidoscopy or colonoscopy. You may have a sigmoidoscopy every 5 years or a colonoscopy every 10 years starting at age 72. Hepatitis C blood test. Hepatitis B blood test. Sexually transmitted disease (STD) testing. Diabetes screening. This is done by checking your blood sugar (glucose) after you have not eaten for a while (fasting). You may have this done every 1-3 years. Bone density scan. This is done to screen for osteoporosis. You may have this done starting at age 57. Mammogram. This may be done every 1-2 years. Talk to your health care provider about how often you should have regular mammograms. Talk with your health care provider about your test results, treatment options, and if necessary, the need for more tests. Vaccines  Your health care provider may recommend certain vaccines, such as: Influenza vaccine. This is recommended every year. Tetanus, diphtheria, and acellular pertussis (Tdap, Td) vaccine. You may need a Td booster every 10 years. Zoster vaccine. You may need this after age 81. Pneumococcal 13-valent conjugate (PCV13) vaccine. One dose  is recommended after age  65. Pneumococcal polysaccharide (PPSV23) vaccine. One dose is recommended after age 7. Talk to your health care provider about which screenings and vaccines you need and how often you need them. This information is not intended to replace advice given to you by your health care provider. Make sure you discuss any questions you have with your health care provider. Document Released: 05/19/2015 Document Revised: 01/10/2016 Document Reviewed: 02/21/2015 Elsevier Interactive Patient Education  2017 [place] Prevention in the Home Falls can cause injuries. They can happen to people of all ages. There are many things you can do to make your home safe and to help prevent falls. What can I do on the outside of my home? Regularly fix the edges of walkways and driveways and fix any cracks. Remove anything that might make you trip as you walk through a door, such as a raised step or threshold. Trim any bushes or trees on the path to your home. Use bright outdoor lighting. Clear any walking paths of anything that might make someone trip, such as rocks or tools. Regularly check to see if handrails are loose or broken. Make sure that both sides of any steps have handrails. Any raised decks and porches should have guardrails on the edges. Have any leaves, snow, or ice cleared regularly. Use sand or salt on walking paths during winter. Clean up any spills in your garage right away. This includes oil or grease spills. What can I do in the bathroom? Use night lights. Install grab bars by the toilet and in the tub and shower. Do not use towel bars as grab bars. Use non-skid mats or decals in the tub or shower. If you need to sit down in the shower, use a plastic, non-slip stool. Keep the floor dry. Clean up any water that spills on the floor as soon as it happens. Remove soap buildup in the tub or shower regularly. Attach bath mats securely with double-sided non-slip rug tape. Do not have throw  rugs and other things on the floor that can make you trip. What can I do in the bedroom? Use night lights. Make sure that you have a light by your bed that is easy to reach. Do not use any sheets or blankets that are too big for your bed. They should not hang down onto the floor. Have a firm chair that has side arms. You can use this for support while you get dressed. Do not have throw rugs and other things on the floor that can make you trip. What can I do in the kitchen? Clean up any spills right away. Avoid walking on wet floors. Keep items that you use a lot in easy-to-reach places. If you need to reach something above you, use a strong step stool that has a grab bar. Keep electrical cords out of the way. Do not use floor polish or wax that makes floors slippery. If you must use wax, use non-skid floor wax. Do not have throw rugs and other things on the floor that can make you trip. What can I do with my stairs? Do not leave any items on the stairs. Make sure that there are handrails on both sides of the stairs and use them. Fix handrails that are broken or loose. Make sure that handrails are as long as the stairways. Check any carpeting to make sure that it is firmly attached to the stairs. Fix any carpet that is loose or worn. Avoid  having throw rugs at the top or bottom of the stairs. If you do have throw rugs, attach them to the floor with carpet tape. Make sure that you have a light switch at the top of the stairs and the bottom of the stairs. If you do not have them, ask someone to add them for you. What else can I do to help prevent falls? Wear shoes that: Do not have high heels. Have rubber bottoms. Are comfortable and fit you well. Are closed at the toe. Do not wear sandals. If you use a stepladder: Make sure that it is fully opened. Do not climb a closed stepladder. Make sure that both sides of the stepladder are locked into place. Ask someone to hold it for you, if  possible. Clearly mark and make sure that you can see: Any grab bars or handrails. First and last steps. Where the edge of each step is. Use tools that help you move around (mobility aids) if they are needed. These include: Canes. Walkers. Scooters. Crutches. Turn on the lights when you go into a dark area. Replace any light bulbs as soon as they burn out. Set up your furniture so you have a clear path. Avoid moving your furniture around. If any of your floors are uneven, fix them. If there are any pets around you, be aware of where they are. Review your medicines with your doctor. Some medicines can make you feel dizzy. This can increase your chance of falling. Ask your doctor what other things that you can do to help prevent falls. This information is not intended to replace advice given to you by your health care provider. Make sure you discuss any questions you have with your health care provider. Document Released: 02/16/2009 Document Revised: 09/28/2015 Document Reviewed: 05/27/2014 Elsevier Interactive Patient Education  2017 [employer].

## 2022-02-28 DIAGNOSIS — Z79899 Other long term (current) drug therapy: Secondary | ICD-10-CM | POA: Diagnosis not present

## 2022-02-28 DIAGNOSIS — E559 Vitamin D deficiency, unspecified: Secondary | ICD-10-CM | POA: Diagnosis not present

## 2022-02-28 DIAGNOSIS — E785 Hyperlipidemia, unspecified: Secondary | ICD-10-CM | POA: Diagnosis not present

## 2022-02-28 DIAGNOSIS — I10 Essential (primary) hypertension: Secondary | ICD-10-CM | POA: Diagnosis not present

## 2022-03-01 ENCOUNTER — Other Ambulatory Visit: Payer: Self-pay | Admitting: Family Medicine

## 2022-03-01 ENCOUNTER — Ambulatory Visit: Payer: Medicare PPO | Admitting: Family Medicine

## 2022-03-01 DIAGNOSIS — Z1231 Encounter for screening mammogram for malignant neoplasm of breast: Secondary | ICD-10-CM

## 2022-03-13 ENCOUNTER — Ambulatory Visit: Payer: Medicare PPO

## 2022-03-13 ENCOUNTER — Ambulatory Visit
Admission: RE | Admit: 2022-03-13 | Discharge: 2022-03-13 | Disposition: A | Discharge status: Home / Self Care | Payer: Medicare PPO | Source: Ambulatory Visit | Attending: Family Medicine | Admitting: Family Medicine

## 2022-03-13 DIAGNOSIS — Z1231 Encounter for screening mammogram for malignant neoplasm of breast: Secondary | ICD-10-CM

## 2022-05-13 ENCOUNTER — Other Ambulatory Visit: Payer: Self-pay | Admitting: Family Medicine

## 2022-05-13 NOTE — Telephone Encounter (Signed)
Patient is due for medicare wellness with Dr. Damita Dunnings; please call to schedule appt.

## 2022-05-13 NOTE — Telephone Encounter (Signed)
Called patient and she stated she is seeing someone closer her to home right now. She would like to keep him as her PCP but right now she can't make it here.

## 2022-05-14 NOTE — Telephone Encounter (Signed)
Refill request for omeprazole (PRILOSEC) 40 MG capsule   LOV - 05/18/21 Next OV - not scheduled; would not schedule appt when called Last refill - 01/29/22 #180/0

## 2022-05-15 NOTE — Telephone Encounter (Signed)
I sent the refill since I didn't want her to run out.  I wish her only the best.  If she is going to be able to get here for future visits, then please schedule when possible.  If she is going to continue with another clinic that is closer by then I'll defer to her.  Either way, I hope she is doing well.  Thanks.

## 2022-06-06 ENCOUNTER — Ambulatory Visit: Payer: Medicare PPO | Attending: Internal Medicine | Admitting: Internal Medicine

## 2022-06-06 ENCOUNTER — Encounter: Payer: Self-pay | Admitting: Internal Medicine

## 2022-06-06 VITALS — BP 160/94 | HR 62 | Ht 63.0 in | Wt 193.9 lb

## 2022-06-06 DIAGNOSIS — R0602 Shortness of breath: Secondary | ICD-10-CM

## 2022-06-06 NOTE — Patient Instructions (Signed)
Medication Instructions:   *If you need a refill on your cardiac medications before your next appointment, please call your pharmacy*   Lab Work:  If you have labs (blood work) drawn today and your tests are completely normal, you will receive your results only by: [facility] (if you have MyChart) OR A paper copy in the mail If you have any lab test that is abnormal or we need to change your treatment, we will call you to review the results.   Testing/Procedures: Your physician has requested that you have an echocardiogram. Echocardiography is a painless test that uses sound waves to create images of your heart. It provides your doctor with information about the size and shape of your heart and how well your heart's chambers and valves are working. This procedure takes approximately one hour. There are no restrictions for this procedure. Please do NOT wear cologne, perfume, aftershave, or lotions (deodorant is allowed). Please arrive 15 minutes prior to your appointment time.    Follow-Up: At [facility], you and your health needs are our priority.  As part of our continuing mission to provide you with exceptional heart care, we have created designated Provider Care Teams.  These Care Teams include your primary Cardiologist (physician) and Advanced Practice Providers (APPs -  Physician Assistants and Nurse Practitioners) who all work together to provide you with the care you need, when you need it.  We recommend signing up for the patient portal called "MyChart".  Sign up information is provided on this After Visit Summary.  MyChart is used to connect with patients for Virtual Visits (Telemedicine).  Patients are able to view lab/test results, encounter notes, upcoming appointments, etc.  Non-urgent messages can be sent to your provider as well.   To learn more about what you can do with MyChart, go to [email or web address].

## 2022-06-06 NOTE — Progress Notes (Signed)
Cardiology Office Note   Date:  06/06/2022   ID:  Keenan, Dimitrov May 31, 1940, MRN [id number]  PCP:  Tonia Ghent, MD  Cardiologist:   Dorris Carnes, MD   Patient referred for evaluation of pulmonary HTN    History of Present Illness: HOUSTON SURGES is a 82 y.o. female with a history of HTN  She is seen at [facility]    Had echocardiogram done that showed Normal LVEF  LAE midl  RV dilated mildly, RVEF normal    Est RVSP was 49 mm Hg  Referred to cardiology  The pt notes intermitt chest tightness with and without activity  Does also get intermitt SOB with activity   Denies dizziness  No syncope     Occasional palpitations   Brief       Current Meds  Medication Sig   albuterol (VENTOLIN HFA) 108 (90 Base) MCG/ACT inhaler Inhale 1-2 puffs into the lungs every 6 (six) hours as needed for wheezing.   amLODipine (NORVASC) 10 MG tablet Take 1 tablet (10 mg total) by mouth daily.   atorvastatin (LIPITOR) 20 MG tablet Take 20 mg by mouth daily.   cetirizine (ZYRTEC) 10 MG tablet Take 1 tablet (10 mg total) by mouth daily.   Cholecalciferol (VITAMIN D-3 PO) Take 1 capsule by mouth once a week.   fluticasone (FLONASE) 50 MCG/ACT nasal spray Place 1 spray into both nostrils daily.   metoprolol succinate (TOPROL-XL) 50 MG 24 hr tablet TAKE 2 TABLETS BY MOUTH EVERY DAY   omeprazole (PRILOSEC) 40 MG capsule TAKE 1 CAPSULE BY MOUTH TWICE A DAY AS NEEDED   [DISCONTINUED] Cholecalciferol (VITAMIN D3) 50 MCG (2000 UT) capsule Take 1 capsule (2,000 Units total) by mouth daily.     Allergies:   Aspirin, Hctz [hydrochlorothiazide], and Lisinopril   Past Medical History:  Diagnosis Date   Allergy    Anxiety    during a period of social upheaval   Arthritis    Bloating    Cataract    Colon polyps    Diabetes mellitus type II    Borderline   Diverticulosis of colon    GERD (gastroesophageal reflux disease)    Heartburn    Hiatal hernia    Hypertension    Vitamin D  deficiency     Past Surgical History:  Procedure Laterality Date   CATARACT EXTRACTION     Bil   COLONOSCOPY     DILATION AND CURETTAGE OF UTERUS     1 miscarriage   DILATION AND CURETTAGE OF UTERUS  01/2008   vaginal bleeding (Dr. Ronita Hipps)   [place]   right due to cysts   OOPHORECTOMY  1971   ovarian cystectomy left   POLYPECTOMY     VAGINAL DELIVERY     NSVD x 5     Social History:  The patient  reports that she quit smoking about 48 years ago. Her smoking use included cigarettes. She has never used smokeless tobacco. She reports that she does not drink alcohol and does not use drugs.   Family History:  The patient's family history includes Alcohol abuse in her sister and sister; Colon cancer in her son; Heart attack in her brother; Heart disease in her brother and father; Hypertension in her father and mother; Ulcers in her brother.    ROS:  Please see the history of present illness. All other systems are reviewed and  Negative to the above problem except as noted.  PHYSICAL EXAM: VS:  BP (!) 160/94   Pulse 62   Ht '5\' 3"'$  (1.6 m)   Wt 193 lb 13.9 oz (87.9 kg)   SpO2 97%   BMI 34.34 kg/m   GEN: Well nourished, well developed, in no acute distress  HEENT: normal  Neck: no JVD, carotid bruits Cardiac: RRR  Normal S1, S2  No murmurs,  No RV heave   No LE edema  Respiratory:  clear to auscultation bilaterally No rales or wheezes GI: soft, nontender, nondistended, + BS  No hepatomegaly  MS: no deformity Moving all extremities   Skin: warm and dry, no rash Neuro:  Strength and sensation are intact Psych: euthymic mood, full affect   EKG:  EKG is ordered today.  NSR  62 pbm  LVH      Lipid Panel    Component Value Date/Time   CHOL 207 (H) 11/16/2020 1017   TRIG 96.0 11/16/2020 1017   HDL 52.30 11/16/2020 1017   CHOLHDL 4 11/16/2020 1017   VLDL 19.2 11/16/2020 1017   LDLCALC 135 (H) 11/16/2020 1017   LDLDIRECT 140.5 06/14/2010 1057      Wt  Readings from Last 3 Encounters:  06/06/22 193 lb 13.9 oz (87.9 kg)  02/25/22 198 lb (89.8 kg)  05/18/21 199 lb (90.3 kg)      ASSESSMENT AND PLAN:  1  ? Pulmonary HTN  PT with recent echo that showed RV mildly dilated   Normal RVEF   RVSP estimated at 49 mm Hg     She denies clots in past   No hx to sugg sleep apnea.   Exam remarkable for HTN but other wise unremarkable  I would recomm an echo to confirm measurements of chambers and confirm hemodynamics  2  HTN   BP is not controlled   Will review outside labs  Will need an additional agent.  Runs high at home as well  3.  HL  Lipids in Jan 2024 LDL 77  On Lipitor '20mg'$      4  Atherosclerosis of aorta.   Risk factor modify  6   PreDM   A1C 6.1   discussed diet  Limt carbs      Current medicines are reviewed at length with the patient today.  The patient does not have concerns regarding medicines.  Signed, Dorris Carnes, MD  06/06/2022 10:14 AM    [facility] [address], [place], [place]  [address] Phone: [phone]; Fax: [phone]

## 2022-07-03 ENCOUNTER — Ambulatory Visit (HOSPITAL_COMMUNITY): Payer: Medicare PPO | Attending: Cardiology

## 2022-07-03 DIAGNOSIS — R0602 Shortness of breath: Secondary | ICD-10-CM

## 2022-07-03 LAB — ECHOCARDIOGRAM COMPLETE
Area-P 1/2: 4.02 cm2
S' Lateral: 2.2 cm

## 2022-07-04 ENCOUNTER — Telehealth: Payer: Self-pay | Admitting: Internal Medicine

## 2022-07-04 NOTE — Telephone Encounter (Signed)
Called the pt back and informed her that Dr. Harrington Challenger hasn't gotten a chance to result her echo yet, but as soon as she does, a nurse from the office will call her shortly thereafter to provide her with those results.   Pt verbalized understanding and agrees with this plan.

## 2022-07-04 NOTE — Telephone Encounter (Signed)
Pt calling to f/u on Echo results. Please advise

## 2022-07-14 NOTE — Progress Notes (Unsigned)
Cardiology Office Note   Date:  07/18/2022   ID:  Cassandra Cooke, Cassandra Cooke 02-21-1941, MRN [id number]  PCP:  Cleta Alberts, MD  Cardiologist:   Dorris Carnes, MD   Pt presents for follow up, to discuss test results.     History of Present Illness: Cassandra Cooke is a 82 y.o. female with a history of HTN  She is seen at [facility]    Had echo done that showed Normal LVEF  LAE  RV mildly dilated, RVEF normal    Est RVSP was 49 mm Hg  Referred to cardiology I saw her for the first time in Feb 2024   She noted intermitt chest tightness with and without activity    SOme intermitt SOB with activity    No dizziness or syncope     Echo ordered   LVEF and RVEF normal  Est   RV pressure 50   Since seen the pt says shestill gets SOB with some activity    Has some chest tightness with activities     One year ago she did not have   Current Meds  Medication Sig   albuterol (VENTOLIN HFA) 108 (90 Base) MCG/ACT inhaler Inhale 1-2 puffs into the lungs every 6 (six) hours as needed for wheezing.   amLODipine (NORVASC) 10 MG tablet Take 1 tablet (10 mg total) by mouth daily.   atorvastatin (LIPITOR) 20 MG tablet Take 20 mg by mouth daily.   cetirizine (ZYRTEC) 10 MG tablet Take 1 tablet (10 mg total) by mouth daily.   fluticasone (FLONASE) 50 MCG/ACT nasal spray Place 1 spray into both nostrils daily.   metoprolol succinate (TOPROL-XL) 50 MG 24 hr tablet TAKE 2 TABLETS BY MOUTH EVERY DAY   omeprazole (PRILOSEC) 40 MG capsule TAKE 1 CAPSULE BY MOUTH TWICE A DAY AS NEEDED   Vitamin D, Ergocalciferol, (DRISDOL) 1.25 MG (50000 UNIT) CAPS capsule Take 50,000 Units by mouth once a week.   [DISCONTINUED] Cholecalciferol (VITAMIN D-3 PO) Take 1 capsule by mouth once a week.     Allergies:   Aspirin, Hctz [hydrochlorothiazide], and Lisinopril   Past Medical History:  Diagnosis Date   Allergy    Anxiety    during a period of social upheaval   Arthritis    Bloating    Cataract    Colon polyps     Diabetes mellitus type II    Borderline   Diverticulosis of colon    GERD (gastroesophageal reflux disease)    Heartburn    Hiatal hernia    Hypertension    Vitamin D deficiency     Past Surgical History:  Procedure Laterality Date   CATARACT EXTRACTION     Bil   COLONOSCOPY     DILATION AND CURETTAGE OF UTERUS     1 miscarriage   DILATION AND CURETTAGE OF UTERUS  01/2008   vaginal bleeding (Dr. Ronita Hipps)   [place]   right due to cysts   OOPHORECTOMY  1971   ovarian cystectomy left   POLYPECTOMY     VAGINAL DELIVERY     NSVD x 5     Social History:  The patient  reports that she quit smoking about 49 years ago. Her smoking use included cigarettes. She has never used smokeless tobacco. She reports that she does not drink alcohol and does not use drugs.   Family History:  The patient's family history includes Alcohol abuse in her sister and sister; Colon cancer in  her son; Heart attack in her brother; Heart disease in her brother and father; Hypertension in her father and mother; Ulcers in her brother.    ROS:  Please see the history of present illness. All other systems are reviewed and  Negative to the above problem except as noted.    PHYSICAL EXAM: VS:  BP 130/88   Pulse 61   Ht '5\' 3"'$  (1.6 m)   Wt 192 lb 3.2 oz (87.2 kg)   SpO2 98%   BMI 34.05 kg/m   GEN: Obese 82 yo in NAD  HEENT: normal  Neck: no JVD, carotid bruit Cardiac: RRR  Normal S1, S2  No murmur,  No RV heave    Respiratory:  clear to auscultation bilaterally  GI: soft, nontender.  No hepatomegaly  Ext  No LE edema  EKG:  EKG is not ordered today        Lipid Panel    Component Value Date/Time   CHOL 207 (H) 11/16/2020 1017   TRIG 96.0 11/16/2020 1017   HDL 52.30 11/16/2020 1017   CHOLHDL 4 11/16/2020 1017   VLDL 19.2 11/16/2020 1017   LDLCALC 135 (H) 11/16/2020 1017   LDLDIRECT 140.5 06/14/2010 1057      Wt Readings from Last 3 Encounters:  07/18/22 192 lb 3.2 oz (87.2 kg)   06/06/22 193 lb 13.9 oz (87.9 kg)  02/25/22 198 lb (89.8 kg)      ASSESSMENT AND PLAN:  1  Dyspnea/ chest pressure The pt  was referred for pulmonary HTN on outside echo     She noted some dyspnea with exertion    Echo here LVEF and RVEF normal   RV systolic pressure estimated at 50. Talking to the pt she notes an h exertional component to symptoms    I would recomm R and L heart catheterization to evaluate anatomy and filling pressures    Risks / benefits described   Patient understands and agrees to proceed.   WIll get preprocedure labs today     2  HTN   BP is controlled      3.  HL  Lipids in Jan 2024 LDL 77  On Lipitor '20mg'$    Follow for now   4  Atherosclerosis of aorta.   Risk factor modify  6   PreDM   A1C 6.3   Reviewed at last visit   Limit carbs        Current medicines are reviewed at length with the patient today.  The patient does not have concerns regarding medicines.  Signed, Dorris Carnes, MD  07/18/2022 8:10 AM    [address], [place], [place]  [address] Phone: [phone]; Fax: [phone]

## 2022-07-14 NOTE — H&P (View-Only) (Signed)
Cardiology Office Note   Date:  07/18/2022   ID:  Cassandra Cooke 02-21-1941, MRN [id number]  PCP:  Cassandra Alberts, MD  Cardiologist:   Dorris Carnes, MD   Pt presents for follow up, to discuss test results.     History of Present Illness: Cassandra Cooke is a 82 y.o. female with a history of HTN  She is seen at [facility]    Had echo done that showed Normal LVEF  LAE  RV mildly dilated, RVEF normal    Est RVSP was 49 mm Hg  Referred to cardiology I saw her for the first time in Feb 2024   She noted intermitt chest tightness with and without activity    SOme intermitt SOB with activity    No dizziness or syncope     Echo ordered   LVEF and RVEF normal  Est   RV pressure 50   Since seen the pt says shestill gets SOB with some activity    Has some chest tightness with activities     One year ago she did not have   Current Meds  Medication Sig   albuterol (VENTOLIN HFA) 108 (90 Base) MCG/ACT inhaler Inhale 1-2 puffs into the lungs every 6 (six) hours as needed for wheezing.   amLODipine (NORVASC) 10 MG tablet Take 1 tablet (10 mg total) by mouth daily.   atorvastatin (LIPITOR) 20 MG tablet Take 20 mg by mouth daily.   cetirizine (ZYRTEC) 10 MG tablet Take 1 tablet (10 mg total) by mouth daily.   fluticasone (FLONASE) 50 MCG/ACT nasal spray Place 1 spray into both nostrils daily.   metoprolol succinate (TOPROL-XL) 50 MG 24 hr tablet TAKE 2 TABLETS BY MOUTH EVERY DAY   omeprazole (PRILOSEC) 40 MG capsule TAKE 1 CAPSULE BY MOUTH TWICE A DAY AS NEEDED   Vitamin D, Ergocalciferol, (DRISDOL) 1.25 MG (50000 UNIT) CAPS capsule Take 50,000 Units by mouth once a week.   [DISCONTINUED] Cholecalciferol (VITAMIN D-3 PO) Take 1 capsule by mouth once a week.     Allergies:   Aspirin, Hctz [hydrochlorothiazide], and Lisinopril   Past Medical History:  Diagnosis Date   Allergy    Anxiety    during a period of social upheaval   Arthritis    Bloating    Cataract    Colon polyps     Diabetes mellitus type II    Borderline   Diverticulosis of colon    GERD (gastroesophageal reflux disease)    Heartburn    Hiatal hernia    Hypertension    Vitamin D deficiency     Past Surgical History:  Procedure Laterality Date   CATARACT EXTRACTION     Bil   COLONOSCOPY     DILATION AND CURETTAGE OF UTERUS     1 miscarriage   DILATION AND CURETTAGE OF UTERUS  01/2008   vaginal bleeding (Dr. Ronita Hipps)   [place]   right due to cysts   OOPHORECTOMY  1971   ovarian cystectomy left   POLYPECTOMY     VAGINAL DELIVERY     NSVD x 5     Social History:  The patient  reports that she quit smoking about 49 years ago. Her smoking use included cigarettes. She has never used smokeless tobacco. She reports that she does not drink alcohol and does not use drugs.   Family History:  The patient's family history includes Alcohol abuse in her sister and sister; Colon cancer in  her son; Heart attack in her brother; Heart disease in her brother and father; Hypertension in her father and mother; Ulcers in her brother.    ROS:  Please see the history of present illness. All other systems are reviewed and  Negative to the above problem except as noted.    PHYSICAL EXAM: VS:  BP 130/88   Pulse 61   Ht '5\' 3"'$  (1.6 m)   Wt 192 lb 3.2 oz (87.2 kg)   SpO2 98%   BMI 34.05 kg/m   GEN: Obese 82 yo in NAD  HEENT: normal  Neck: no JVD, carotid bruit Cardiac: RRR  Normal S1, S2  No murmur,  No RV heave    Respiratory:  clear to auscultation bilaterally  GI: soft, nontender.  No hepatomegaly  Ext  No LE edema  EKG:  EKG is not ordered today        Lipid Panel    Component Value Date/Time   CHOL 207 (H) 11/16/2020 1017   TRIG 96.0 11/16/2020 1017   HDL 52.30 11/16/2020 1017   CHOLHDL 4 11/16/2020 1017   VLDL 19.2 11/16/2020 1017   LDLCALC 135 (H) 11/16/2020 1017   LDLDIRECT 140.5 06/14/2010 1057      Wt Readings from Last 3 Encounters:  07/18/22 192 lb 3.2 oz (87.2 kg)   06/06/22 193 lb 13.9 oz (87.9 kg)  02/25/22 198 lb (89.8 kg)      ASSESSMENT AND PLAN:  1  Dyspnea/ chest pressure The pt  was referred for pulmonary HTN on outside echo     She noted some dyspnea with exertion    Echo here LVEF and RVEF normal   RV systolic pressure estimated at 50. Talking to the pt she notes an h exertional component to symptoms    I would recomm R and L heart catheterization to evaluate anatomy and filling pressures    Risks / benefits described   Patient understands and agrees to proceed.   WIll get preprocedure labs today     2  HTN   BP is controlled      3.  HL  Lipids in Jan 2024 LDL 77  On Lipitor '20mg'$    Follow for now   4  Atherosclerosis of aorta.   Risk factor modify  6   PreDM   A1C 6.3   Reviewed at last visit   Limit carbs        Current medicines are reviewed at length with the patient today.  The patient does not have concerns regarding medicines.  Signed, Dorris Carnes, MD  07/18/2022 8:10 AM    [address], [place], [place]  [address] Phone: [phone]; Fax: [phone]

## 2022-07-18 ENCOUNTER — Encounter: Payer: Self-pay | Admitting: Internal Medicine

## 2022-07-18 ENCOUNTER — Ambulatory Visit: Payer: Medicare PPO | Attending: Internal Medicine | Admitting: Internal Medicine

## 2022-07-18 VITALS — BP 130/88 | HR 61 | Ht 63.0 in | Wt 192.2 lb

## 2022-07-18 DIAGNOSIS — Z01810 Encounter for preprocedural cardiovascular examination: Secondary | ICD-10-CM | POA: Diagnosis not present

## 2022-07-18 NOTE — Patient Instructions (Addendum)
Medication Instructions:   *If you need a refill on your cardiac medications before your next appointment, please call your pharmacy*   Lab Work: BMET AND CBC  If you have labs (blood work) drawn today and your tests are completely normal, you will receive your results only by: [place] (if you have MyChart) OR A paper copy in the mail If you have any lab test that is abnormal or we need to change your treatment, we will call you to review the results.   Testing/Procedures:  [facility] DEPT OF [address] [place], [place] [id number] [place] [address] Dept: [phone] Loc: [place]  07/18/2022  You are scheduled for a Cardiac Catheterization on Tuesday, July 30, 2022 with Dr. Peter Martinique.  1. Please arrive at the [facility] (Main Entrance A) at [facility]: [address] [place], [place] [address] at 5:30 AM (This time is two hours before your procedure to ensure your preparation). Free valet parking service is available.   Special note: Every effort is made to have your procedure done on time. Please understand that emergencies sometimes delay scheduled procedures.  2. Diet: Do not eat solid foods after midnight.  The patient may have clear liquids until 5am upon the day of the procedure.  3. Labs: You will need to have blood drawn on 07/18/22.  4. Medication instructions in preparation for your procedure:   Contrast Allergy: No   On the morning of your procedure, take your Aspirin 81 mg and any morning medicines NOT listed above.  You may use sips of water.  5. Plan for one night stay--bring personal belongings. 6. Bring a current list of your medications and current insurance cards. 7. You MUST have a responsible person to drive you home. 8. Someone MUST be with you the first 24 hours after you arrive home or your discharge will be  delayed. 9. Please wear clothes that are easy to get on and off and wear slip-on shoes.  Thank you for allowing [place] to care for you!   -- [facility]    Follow-Up: At [facility], you and your health needs are our priority.  As part of our continuing mission to provide you with exceptional heart care, we have created designated Provider Care Teams.  These Care Teams include your primary Cardiologist (physician) and Advanced Practice Providers (APPs -  Physician Assistants and Nurse Practitioners) who all work together to provide you with the care you need, when you need it.  We recommend signing up for the patient portal called "MyChart".  Sign up information is provided on this After Visit Summary.  MyChart is used to connect with patients for Virtual Visits (Telemedicine).  Patients are able to view lab/test results, encounter notes, upcoming appointments, etc.  Non-urgent messages can be sent to your provider as well.   To learn more about what you can do with MyChart, go to [email or web address].    Your next appointment: 2 WEEKS AFTER CATH ON 07/30/22

## 2022-07-19 LAB — CBC
Hematocrit: 40.2 % (ref 34.0–46.6)
Hemoglobin: 12.9 g/dL (ref 11.1–15.9)
MCH: 27.6 pg (ref 26.6–33.0)
MCHC: 32.1 g/dL (ref 31.5–35.7)
MCV: 86 fL (ref 79–97)
Platelets: 326 10*3/uL (ref 150–450)
RBC: 4.67 x10E6/uL (ref 3.77–5.28)
RDW: 12.6 % (ref 11.7–15.4)
WBC: 7.1 10*3/uL (ref 3.4–10.8)

## 2022-07-19 LAB — BASIC METABOLIC PANEL WITH GFR
BUN/Creatinine Ratio: 19 (ref 12–28)
BUN: 16 mg/dL (ref 8–27)
CO2: 22 mmol/L (ref 20–29)
Calcium: 10.1 mg/dL (ref 8.7–10.3)
Chloride: 123 mmol/L (ref 96–106)
Creatinine, Ser: 0.84 mg/dL (ref 0.57–1.00)
Glucose: 101 mg/dL — ABNORMAL HIGH (ref 70–99)
Potassium: 3.9 mmol/L (ref 3.5–5.2)
Sodium: 124 mmol/L — ABNORMAL LOW (ref 134–144)
eGFR: 71 mL/min/1.73 (ref >59)

## 2022-07-22 ENCOUNTER — Telehealth: Payer: Self-pay

## 2022-07-22 ENCOUNTER — Ambulatory Visit: Payer: Medicare PPO | Attending: Cardiovascular Disease

## 2022-07-22 DIAGNOSIS — E871 Hypo-osmolality and hyponatremia: Secondary | ICD-10-CM

## 2022-07-22 DIAGNOSIS — E878 Other disorders of electrolyte and fluid balance, not elsewhere classified: Secondary | ICD-10-CM

## 2022-07-22 LAB — BASIC METABOLIC PANEL
BUN/Creatinine Ratio: 14 (ref 12–28)
BUN: 10 mg/dL (ref 8–27)
CO2: 24 mmol/L (ref 20–29)
Calcium: 10.3 mg/dL (ref 8.7–10.3)
Chloride: 104 mmol/L (ref 96–106)
Creatinine, Ser: 0.73 mg/dL (ref 0.57–1.00)
Glucose: 104 mg/dL — ABNORMAL HIGH (ref 70–99)
Potassium: 4.5 mmol/L (ref 3.5–5.2)
Sodium: 137 mmol/L (ref 134–144)
eGFR: 84 mL/min/{1.73_m2} (ref >59)

## 2022-07-22 NOTE — Telephone Encounter (Signed)
-----   Message from [place], MD sent at 07/19/2022  6:37 AM EDT ----- CBC is normal Electrolytes are abnormal though,   I would repeat to confirm not error  Sodium is very low and chloride high   (send STAT so processed quicker since Friday) Please confirm meds, supplements she may taking

## 2022-07-22 NOTE — Telephone Encounter (Signed)
Pt reports that she is only taking what is on her med list except on occasion and not regularly and it has been about 2 weeks... she will take [employer] and "Bioenzymes" (OTC)... but she has not taken them in a while and will continue to hold off taking them.

## 2022-07-22 NOTE — Telephone Encounter (Signed)
Left a message for the pt to call back.  

## 2022-07-22 NOTE — Telephone Encounter (Signed)
Pt verbalized understanding of her lab results and will have repeat BMET this afternoon.   I have moved up her cath since I do not believe we meant to wait until next week to have it schedule... to this Friday 07/26/22 with Dr Martinique arrival 5:30 am. Pt agrees and I will print new instructions for her to pick up when she comes to the lab.

## 2022-07-23 ENCOUNTER — Telehealth: Payer: Self-pay

## 2022-07-23 NOTE — Telephone Encounter (Signed)
Attempted to call patient to review lab results, no answer, unable to leave voice mail.

## 2022-07-23 NOTE — Telephone Encounter (Signed)
-----   Message from Thayer Headings, MD sent at 07/22/2022  3:04 PM EDT ----- Labs are stable  Cont meds

## 2022-07-24 ENCOUNTER — Telehealth: Payer: Self-pay | Admitting: Internal Medicine

## 2022-07-24 NOTE — Telephone Encounter (Signed)
Pt called per message received from [facility] triage, Pt calling back about Lab results.   Per Dr. Elmarie Shiley review, lab results were shared with the Pt, and look fine for upcoming heart catheterization on Friday.   Pt educated on electrolytes, and how they impact cardiac function when out of balance.  Pt had no questions, and understood all education.  Pt has letter, and knows to report for her heart cath at 530 am, on 07/26/2022.  No additional follow up required at this time.

## 2022-07-24 NOTE — Telephone Encounter (Signed)
Pt is returning call regarding lab results.  

## 2022-07-25 ENCOUNTER — Telehealth: Payer: Self-pay | Admitting: *Deleted

## 2022-07-25 NOTE — Telephone Encounter (Addendum)
Cardiac Catheterization scheduled at [facility] for: Friday July 26, 2022 7:30 AM Arrival time [place] Entrance A at 5:30 AM  Nothing to eat after midnight prior to procedure, clear liquids until 5 AM day of procedure.  Medication instructions: -Usual morning medications can be taken with sips of water including aspirin 81 mg (pt reports she can take and tolerate this dose).  Confirmed patient has responsible adult to drive home post procedure and be with patient first 24 hours after arriving home.  Plan to go home the same day, you will only stay overnight if medically necessary.  Reviewed procedure instructions with patient.

## 2022-07-26 ENCOUNTER — Ambulatory Visit (HOSPITAL_COMMUNITY)
Admission: RE | Admit: 2022-07-26 | Discharge: 2022-07-26 | Disposition: A | Discharge status: Home / Self Care | Payer: Medicare PPO | Attending: Cardiology | Admitting: Cardiology

## 2022-07-26 ENCOUNTER — Other Ambulatory Visit: Payer: Self-pay

## 2022-07-26 ENCOUNTER — Encounter (HOSPITAL_COMMUNITY): Payer: Self-pay | Admitting: Cardiology

## 2022-07-26 ENCOUNTER — Encounter (HOSPITAL_COMMUNITY)
Admission: RE | Disposition: A | Discharge status: Home / Self Care | Payer: Self-pay | Source: Home / Self Care | Attending: Cardiology

## 2022-07-26 DIAGNOSIS — Z79899 Other long term (current) drug therapy: Secondary | ICD-10-CM | POA: Insufficient documentation

## 2022-07-26 DIAGNOSIS — E785 Hyperlipidemia, unspecified: Secondary | ICD-10-CM | POA: Diagnosis not present

## 2022-07-26 DIAGNOSIS — R0609 Other forms of dyspnea: Secondary | ICD-10-CM

## 2022-07-26 DIAGNOSIS — R7303 Prediabetes: Secondary | ICD-10-CM | POA: Insufficient documentation

## 2022-07-26 DIAGNOSIS — I700 Atherosclerosis of aorta: Secondary | ICD-10-CM | POA: Insufficient documentation

## 2022-07-26 DIAGNOSIS — Z87891 Personal history of nicotine dependence: Secondary | ICD-10-CM | POA: Insufficient documentation

## 2022-07-26 DIAGNOSIS — I10 Essential (primary) hypertension: Secondary | ICD-10-CM | POA: Diagnosis not present

## 2022-07-26 DIAGNOSIS — I2510 Atherosclerotic heart disease of native coronary artery without angina pectoris: Secondary | ICD-10-CM

## 2022-07-26 HISTORY — PX: RIGHT/LEFT HEART CATH AND CORONARY ANGIOGRAPHY: CATH118266

## 2022-07-26 LAB — POCT I-STAT EG7
Acid-base deficit: 1 mmol/L (ref 0.0–2.0)
Bicarbonate: 24.5 mmol/L (ref 20.0–28.0)
Calcium, Ion: 1.31 mmol/L (ref 1.15–1.40)
HCT: 39 % (ref 36.0–46.0)
Hemoglobin: 13.3 g/dL (ref 12.0–15.0)
O2 Saturation: 71 %
Potassium: 3.9 mmol/L (ref 3.5–5.1)
Sodium: 141 mmol/L (ref 135–145)
TCO2: 26 mmol/L (ref 22–32)
pCO2, Ven: 42 mmHg — ABNORMAL LOW (ref 44–60)
pH, Ven: 7.374 (ref 7.25–7.43)
pO2, Ven: 38 mmHg (ref 32–45)

## 2022-07-26 LAB — POCT I-STAT 7, (LYTES, BLD GAS, ICA,H+H)
Acid-base deficit: 1 mmol/L (ref 0.0–2.0)
Bicarbonate: 23.4 mmol/L (ref 20.0–28.0)
Calcium, Ion: 1.28 mmol/L (ref 1.15–1.40)
HCT: 38 % (ref 36.0–46.0)
Hemoglobin: 12.9 g/dL (ref 12.0–15.0)
O2 Saturation: 96 %
Potassium: 3.8 mmol/L (ref 3.5–5.1)
Sodium: 141 mmol/L (ref 135–145)
TCO2: 25 mmol/L (ref 22–32)
pCO2 arterial: 37.8 mmHg (ref 32–48)
pH, Arterial: 7.4 (ref 7.35–7.45)
pO2, Arterial: 79 mmHg — ABNORMAL LOW (ref 83–108)

## 2022-07-26 LAB — GLUCOSE, CAPILLARY
Glucose-Capillary: 95 mg/dL (ref 70–99)
Glucose-Capillary: 100 mg/dL — ABNORMAL HIGH (ref 70–99)

## 2022-07-26 SURGERY — RIGHT/LEFT HEART CATH AND CORONARY ANGIOGRAPHY
Anesthesia: LOCAL

## 2022-07-26 MED ORDER — HEPARIN SODIUM (PORCINE) 1000 UNIT/ML IJ SOLN
INTRAMUSCULAR | Status: DC | PRN
  Administered 2022-07-26: 3500 [IU] via INTRAVENOUS

## 2022-07-26 MED ORDER — LIDOCAINE HCL (PF) 1 % IJ SOLN
INTRAMUSCULAR | Status: AC
  Filled 2022-07-26: qty 30

## 2022-07-26 MED ORDER — HEPARIN (PORCINE) IN NACL 1000-0.9 UT/500ML-% IV SOLN
INTRAVENOUS | Status: DC | PRN
  Administered 2022-07-26 (×3): 500 mL

## 2022-07-26 MED ORDER — ASPIRIN 81 MG PO CHEW
CHEWABLE_TABLET | ORAL | Status: AC
  Administered 2022-07-26: 81 mg via ORAL
  Filled 2022-07-26: qty 1

## 2022-07-26 MED ORDER — SODIUM CHLORIDE 0.9 % IV SOLN: 250.0000 mL | INTRAVENOUS | Status: DC | PRN

## 2022-07-26 MED ORDER — LIDOCAINE HCL (PF) 1 % IJ SOLN
INTRAMUSCULAR | Status: DC | PRN
  Administered 2022-07-26 (×2): 2 mL

## 2022-07-26 MED ORDER — VERAPAMIL HCL 2.5 MG/ML IV SOLN
INTRAVENOUS | Status: DC | PRN
  Administered 2022-07-26: 10 mL via INTRA_ARTERIAL

## 2022-07-26 MED ORDER — ASPIRIN 81 MG PO CHEW: 81.0000 mg | CHEWABLE_TABLET | ORAL | Status: AC

## 2022-07-26 MED ORDER — SODIUM CHLORIDE 0.9% FLUSH: 3.0000 mL | Freq: Two times a day (BID) | INTRAVENOUS | Status: DC

## 2022-07-26 MED ORDER — MIDAZOLAM HCL 2 MG/2ML IJ SOLN
INTRAMUSCULAR | Status: DC | PRN
  Administered 2022-07-26: 1 mg via INTRAVENOUS

## 2022-07-26 MED ORDER — MIDAZOLAM HCL 2 MG/2ML IJ SOLN
INTRAMUSCULAR | Status: AC
  Filled 2022-07-26: qty 2

## 2022-07-26 MED ORDER — FENTANYL CITRATE (PF) 100 MCG/2ML IJ SOLN
INTRAMUSCULAR | Status: DC | PRN
  Administered 2022-07-26: 25 ug via INTRAVENOUS

## 2022-07-26 MED ORDER — ACETAMINOPHEN 325 MG PO TABS: 650.0000 mg | ORAL_TABLET | ORAL | Status: DC | PRN

## 2022-07-26 MED ORDER — VERAPAMIL HCL 2.5 MG/ML IV SOLN
INTRAVENOUS | Status: AC
  Filled 2022-07-26: qty 2

## 2022-07-26 MED ORDER — HEPARIN SODIUM (PORCINE) 1000 UNIT/ML IJ SOLN
INTRAMUSCULAR | Status: AC
  Filled 2022-07-26: qty 10

## 2022-07-26 MED ORDER — SODIUM CHLORIDE 0.9% FLUSH: 3.0000 mL | INTRAVENOUS | Status: DC | PRN

## 2022-07-26 MED ORDER — ONDANSETRON HCL 4 MG/2ML IJ SOLN: 4.0000 mg | Freq: Four times a day (QID) | INTRAMUSCULAR | Status: DC | PRN

## 2022-07-26 MED ORDER — FENTANYL CITRATE (PF) 100 MCG/2ML IJ SOLN
INTRAMUSCULAR | Status: AC
  Filled 2022-07-26: qty 2

## 2022-07-26 MED ORDER — SODIUM CHLORIDE 0.9 % IV SOLN: INTRAVENOUS | Status: DC

## 2022-07-26 MED ORDER — SODIUM CHLORIDE 0.9 % WEIGHT BASED INFUSION: 1.0000 mL/kg/h | INTRAVENOUS | Status: DC

## 2022-07-26 MED ORDER — IOHEXOL 350 MG/ML SOLN
INTRAVENOUS | Status: DC | PRN
  Administered 2022-07-26: 60 mL

## 2022-07-26 SURGICAL SUPPLY — 12 items
CATH 5FR JL3.5 JR4 ANG PIG MP (CATHETERS) IMPLANT
CATH BALLN WEDGE 5F 110CM (CATHETERS) IMPLANT
DEVICE RAD COMP TR BAND LRG (VASCULAR PRODUCTS) IMPLANT
GLIDESHEATH SLEND SS 6F .021 (SHEATH) IMPLANT
GUIDEWIRE INQWIRE 1.5J.035X260 (WIRE) IMPLANT
INQWIRE 1.5J .035X260CM (WIRE) ×1
KIT HEART LEFT (KITS) ×2 IMPLANT
PACK CARDIAC CATHETERIZATION (CUSTOM PROCEDURE TRAY) ×2 IMPLANT
SHEATH GLIDE SLENDER 4/5FR (SHEATH) IMPLANT
SHEATH PROBE COVER 6X72 (BAG) IMPLANT
TRANSDUCER W/STOPCOCK (MISCELLANEOUS) ×2 IMPLANT
TUBING CIL FLEX 10 FLL-RA (TUBING) ×2 IMPLANT

## 2022-07-26 NOTE — Interval H&P Note (Signed)
History and Physical Interval Note:  07/26/2022 7:12 AM  Cassandra Cooke  has presented today for surgery, with the diagnosis of chest pain , shortness of breath.  The various methods of treatment have been discussed with the patient and family. After consideration of risks, benefits and other options for treatment, the patient has consented to  Procedure(s): RIGHT/LEFT HEART CATH AND CORONARY ANGIOGRAPHY (N/A) as a surgical intervention.  The patient's history has been reviewed, patient examined, no change in status, stable for surgery.  I have reviewed the patient's chart and labs.  Questions were answered to the patient's satisfaction.   Cath Lab Visit (complete for each Cath Lab visit)  Clinical Evaluation Leading to the Procedure:   ACS: No.  Non-ACS:    Anginal Classification: CCS II  Anti-ischemic medical therapy: Minimal Therapy (1 class of medications)  Non-Invasive Test Results: No non-invasive testing performed  Prior CABG: No previous CABG        Collier [facility] 07/26/2022 7:12 AM

## 2022-07-30 NOTE — Telephone Encounter (Signed)
Returned call to patient.  Patient states her husband mentioned a nurse telling him that patient would have to take an additional medication when she was discharged after her heart cath procedure on 07/26/22.  Reviewed discharge summary from procedure on 07/26/22, no new or changed medications noted. Advised patient to follow discharge medication list. Patient has follow-up on 08/15/22 with Nicholes Rough, PA-C. Patient verbalized understanding and expressed appreciation for call.

## 2022-07-30 NOTE — Telephone Encounter (Signed)
Patient is calling back to instructions one more time from RN. Would also like to discuss medication she was told she would need. Please advise.

## 2022-08-14 NOTE — Progress Notes (Unsigned)
Office Visit    Patient Name: Cassandra Cooke Date of Encounter: 08/15/2022  PCP:  Elayne Guerin, MD   [facility]  Cardiologist:  Dietrich Pates, MD  Advanced Practice Provider:  No care team member to display Electrophysiologist:  None   HPI    Cassandra Cooke is a 82 y.o. female with a past medical history of hypertension, anxiety, GERD, hiatal hernia presents today for follow-up appointment.  She had an echocardiogram done that showed normal LVEF.  LAE.  RV mildly dilated, RVEF normal.  Estimated RVSP 49 mmHg.  Referred to cardiology.  Seen by Dr. Tenny Craw for the first time February 2024.  She had noted intermittent chest tightness with and without activity.  Some intermittent shortness of breath with activity.  No dizziness or syncope.  Echo ordered.  LVEF and RVEF normal.  Estimated RV pressure was 50 mmHg.  She was last seen July 18, 2022 and at that time she was still getting some short of breath with activity.  Had some chest tightness with activity.  1 year ago she did not experience this.  Right and left heart catheterization was recommended.  Cardiac catheterization with mid RCA lesion of 35%, distal RCA lesion of 80%, mid LAD lesion of 40%, LV end-diastolic pressure was normal.  Plan for medical therapy.  If refractory angina occurs, could consider PCI of the distal RCA but would be complex due to tortuosity and because lesion is at bifurcation.  Today, she tells me that she has had occasional tightness in her chest (about two times in the last few weeks since her cardiac cath). She does not have nitro to use so she did not take one. We discussed her cath and why there was not a stent placed in her distal RCA. Otherwise, she is doing well and is open to trying medication for her coronary artery.   Reports no shortness of breath nor dyspnea on exertion. No edema, orthopnea, PND. Reports no palpitations.    Past Medical History    Past Medical History:   Diagnosis Date   Allergy    Anxiety    during a period of social upheaval   Arthritis    Bloating    Cataract    Colon polyps    Diabetes mellitus type II    Borderline   Diverticulosis of colon    GERD (gastroesophageal reflux disease)    Heartburn    Hiatal hernia    Hypertension    Vitamin D deficiency    Past Surgical History:  Procedure Laterality Date   CATARACT EXTRACTION     Bil   COLONOSCOPY     DILATION AND CURETTAGE OF UTERUS     1 miscarriage   DILATION AND CURETTAGE OF UTERUS  01/2008   vaginal bleeding (Dr. Billy Coast)   OOPHORECTOMY  1965   right due to cysts   OOPHORECTOMY  1971   ovarian cystectomy left   POLYPECTOMY     RIGHT/LEFT HEART CATH AND CORONARY ANGIOGRAPHY N/A 07/26/2022   Procedure: RIGHT/LEFT HEART CATH AND CORONARY ANGIOGRAPHY;  Surgeon: [place], Peter M, MD;  Location: [facility] INVASIVE CV LAB;  Service: Cardiovascular;  Laterality: N/A;   VAGINAL DELIVERY     NSVD x 5    Allergies  Allergies  Allergen Reactions   Aspirin     GI upset with high dose   Hctz [Hydrochlorothiazide] Other (See Comments)    cramping   Lisinopril     Causes cough  EKGs/Labs/Other Studies Reviewed:   The following studies were reviewed today: Diagnostic Dominance: Right Left Anterior Descending  The vessel is moderately tortuous.  Mid LAD lesion is 40% stenosed.    Left Circumflex  Vessel was injected. Vessel is normal in caliber. Vessel is angiographically normal.    Right Coronary Artery  The vessel is moderately tortuous.  Mid RCA lesion is 35% stenosed.  Dist RCA lesion is 80% stenosed.    Intervention   No interventions have been documented.   Left Heart  Left Ventricle LV end diastolic pressure is normal.   Coronary Diagrams  Diagnostic Dominance: Right  Intervention   EKG:  EKG is not ordered today.    Recent Labs: 07/18/2022: Platelets 326 07/22/2022: BUN 10; Creatinine, Ser 0.73 07/26/2022: Hemoglobin 13.3; Potassium 3.9;  Sodium 141  Recent Lipid Panel    Component Value Date/Time   CHOL 207 (H) 11/16/2020 1017   TRIG 96.0 11/16/2020 1017   HDL 52.30 11/16/2020 1017   CHOLHDL 4 11/16/2020 1017   VLDL 19.2 11/16/2020 1017   LDLCALC 135 (H) 11/16/2020 1017   LDLDIRECT 140.5 06/14/2010 1057     Home Medications   Current Meds  Medication Sig   acetaminophen (TYLENOL) 500 MG tablet Take 1,000 mg by mouth every 8 (eight) hours as needed for moderate pain.   amLODipine (NORVASC) 10 MG tablet Take 1 tablet (10 mg total) by mouth daily.   atorvastatin (LIPITOR) 20 MG tablet Take 20 mg by mouth daily.   cetirizine (ZYRTEC) 10 MG tablet Take 1 tablet (10 mg total) by mouth daily. (Patient taking differently: Take 10 mg by mouth daily as needed for allergies.)   fluticasone (FLONASE) 50 MCG/ACT nasal spray Place 1 spray into both nostrils daily. (Patient taking differently: Place 1 spray into both nostrils daily as needed for allergies.)   isosorbide mononitrate (IMDUR) 30 MG 24 hr tablet Take 1 tablet (30 mg total) by mouth daily.   melatonin 5 MG TABS Take 5 mg by mouth at bedtime as needed (sleep).   metoprolol succinate (TOPROL-XL) 50 MG 24 hr tablet TAKE 2 TABLETS BY MOUTH EVERY DAY   omeprazole (PRILOSEC) 40 MG capsule TAKE 1 CAPSULE BY MOUTH TWICE A DAY AS NEEDED (Patient taking differently: Take 40 mg by mouth in the morning and at bedtime.)   Vitamin D, Ergocalciferol, (DRISDOL) 1.25 MG (50000 UNIT) CAPS capsule Take 50,000 Units by mouth every Friday.     Review of Systems      All other systems reviewed and are otherwise negative except as noted above.  Physical Exam    VS:  BP (!) 142/80   Pulse (!) 56   Ht 5\' 3"  (1.6 m)   Wt 193 lb 6.4 oz (87.7 kg)   SpO2 95%   BMI 34.26 kg/m  , BMI Body mass index is 34.26 kg/m.  Wt Readings from Last 3 Encounters:  08/15/22 193 lb 6.4 oz (87.7 kg)  07/26/22 153 lb (69.4 kg)  07/18/22 192 lb 3.2 oz (87.2 kg)     GEN: Well nourished, well  developed, in no acute distress. HEENT: normal. Neck: Supple, no JVD, carotid bruits, or masses. Cardiac: RRR, no murmurs, rubs, or gallops. No clubbing, cyanosis, edema.  Radials/PT 2+ and equal bilaterally.  Respiratory:  Respirations regular and unlabored, clear to auscultation bilaterally. GI: Soft, nontender, nondistended. MS: No deformity or atrophy. Skin: Warm and dry, no rash. Neuro:  Strength and sensation are intact. Psych: Normal affect.  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  Signed, Sharlene Doryessa N Mackinsey Pelland, PA-C 08/15/2022, 11:10 AM [facility]

## 2022-08-15 ENCOUNTER — Encounter: Payer: Self-pay | Admitting: Physician Assistant

## 2022-08-15 ENCOUNTER — Ambulatory Visit: Payer: Medicare PPO | Attending: Physician Assistant | Admitting: Physician Assistant

## 2022-08-15 VITALS — BP 142/80 | HR 56 | Ht 63.0 in | Wt 193.4 lb

## 2022-08-15 DIAGNOSIS — I10 Essential (primary) hypertension: Secondary | ICD-10-CM

## 2022-08-15 DIAGNOSIS — R0602 Shortness of breath: Secondary | ICD-10-CM | POA: Diagnosis not present

## 2022-08-15 DIAGNOSIS — K219 Gastro-esophageal reflux disease without esophagitis: Secondary | ICD-10-CM

## 2022-08-15 DIAGNOSIS — I2510 Atherosclerotic heart disease of native coronary artery without angina pectoris: Secondary | ICD-10-CM | POA: Diagnosis not present

## 2022-08-15 MED ORDER — ISOSORBIDE MONONITRATE ER 30 MG PO TB24: 30.0000 mg | ORAL_TABLET | Freq: Every day | ORAL | 3 refills | Status: DC

## 2022-08-15 NOTE — Patient Instructions (Signed)
Medication Instructions:  1.Start isosorbide mononitrate (Imdur) 30 mg daily *If you need a refill on your cardiac medications before your next appointment, please call your pharmacy*  Lab Work: None ordered If you have labs (blood work) drawn today and your tests are completely normal, you will receive your results only by: MyChart Message (if you have MyChart) OR A paper copy in the mail If you have any lab test that is abnormal or we need to change your treatment, we will call you to review the results.  Follow-Up: At [facility], you and your health needs are our priority.  As part of our continuing mission to provide you with exceptional heart care, we have created designated Provider Care Teams.  These Care Teams include your primary Cardiologist (physician) and Advanced Practice Providers (APPs -  Physician Assistants and Nurse Practitioners) who all work together to provide you with the care you need, when you need it.  Your next appointment:   3-4 month(s)  Provider:   Dietrich Pates, MD    Other Instructions Check your blood pressure daily, 1-2 hours after taking your morning medications for 2 weeks, keep a log and send [place] the readings via mychart at the end of the 2 weeks  Low-Sodium Eating Plan Sodium, which is an element that makes up salt, helps you maintain a healthy balance of fluids in your body. Too much sodium can increase your blood pressure and cause fluid and waste to be held in your body. Your health care provider or dietitian may recommend following this plan if you have high blood pressure (hypertension), kidney disease, liver disease, or heart failure. Eating less sodium can help lower your blood pressure, reduce swelling, and protect your heart, liver, and kidneys. What are tips for following this plan? Reading food labels The Nutrition Facts label lists the amount of sodium in one serving of the food. If you eat more than one serving, you must multiply  the listed amount of sodium by the number of servings. Choose foods with less than 140 mg of sodium per serving. Avoid foods with 300 mg of sodium or more per serving. Shopping  Look for lower-sodium products, often labeled as "low-sodium" or "no salt added." Always check the sodium content, even if foods are labeled as "unsalted" or "no salt added." Buy fresh foods. Avoid canned foods and pre-made or frozen meals. Avoid canned, cured, or processed meats. Buy breads that have less than 80 mg of sodium per slice. Cooking  Eat more home-cooked food and less restaurant, buffet, and fast food. Avoid adding salt when cooking. Use salt-free seasonings or herbs instead of table salt or sea salt. Check with your health care provider or pharmacist before using salt substitutes. Cook with plant-based oils, such as canola, sunflower, or olive oil. Meal planning When eating at a restaurant, ask that your food be prepared with less salt or no salt, if possible. Avoid dishes labeled as brined, pickled, cured, smoked, or made with soy sauce, miso, or teriyaki sauce. Avoid foods that contain MSG (monosodium glutamate). MSG is sometimes added to [place] food, bouillon, and some canned foods. Make meals that can be grilled, baked, poached, roasted, or steamed. These are generally made with less sodium. General information Most people on this plan should limit their sodium intake to 1,500-2,000 mg (milligrams) of sodium each day. What foods should I eat? Fruits Fresh, frozen, or canned fruit. Fruit juice. Vegetables Fresh or frozen vegetables. "No salt added" canned vegetables. "No salt  added" tomato sauce and paste. Low-sodium or reduced-sodium tomato and vegetable juice. Grains Low-sodium cereals, including oats, puffed wheat and rice, and shredded wheat. Low-sodium crackers. Unsalted rice. Unsalted pasta. Low-sodium bread. Whole-grain breads and whole-grain pasta. Meats and other proteins Fresh or  frozen (no salt added) meat, poultry, seafood, and fish. Low-sodium canned tuna and salmon. Unsalted nuts. Dried peas, beans, and lentils without added salt. Unsalted canned beans. Eggs. Unsalted nut butters. Dairy Milk. Soy milk. Cheese that is naturally low in sodium, such as ricotta cheese, fresh mozzarella, or Swiss cheese. Low-sodium or reduced-sodium cheese. Cream cheese. Yogurt. Seasonings and condiments Fresh and dried herbs and spices. Salt-free seasonings. Low-sodium mustard and ketchup. Sodium-free salad dressing. Sodium-free light mayonnaise. Fresh or refrigerated horseradish. Lemon juice. Vinegar. Other foods Homemade, reduced-sodium, or low-sodium soups. Unsalted popcorn and pretzels. Low-salt or salt-free chips. The items listed above may not be a complete list of foods and beverages you can eat. Contact a dietitian for more information. What foods should I avoid? Vegetables Sauerkraut, pickled vegetables, and relishes. Olives. [place] fries. Onion rings. Regular canned vegetables (not low-sodium or reduced-sodium). Regular canned tomato sauce and paste (not low-sodium or reduced-sodium). Regular tomato and vegetable juice (not low-sodium or reduced-sodium). Frozen vegetables in sauces. Grains Instant hot cereals. Bread stuffing, pancake, and biscuit mixes. Croutons. Seasoned rice or pasta mixes. Noodle soup cups. Boxed or frozen macaroni and cheese. Regular salted crackers. Self-rising flour. Meats and other proteins Meat or fish that is salted, canned, smoked, spiced, or pickled. Precooked or cured meat, such as sausages or meat loaves. Tomasa Blase. Ham. Pepperoni. Hot dogs. Corned beef. Chipped beef. Salt pork. Jerky. Pickled herring. Anchovies and sardines. Regular canned tuna. Salted nuts. Dairy Processed cheese and cheese spreads. Hard cheeses. Cheese curds. Blue cheese. Feta cheese. String cheese. Regular cottage cheese. Buttermilk. Canned milk. Fats and oils Salted butter. Regular  margarine. Ghee. Bacon fat. Seasonings and condiments Onion salt, garlic salt, seasoned salt, table salt, and sea salt. Canned and packaged gravies. Worcestershire sauce. Tartar sauce. Barbecue sauce. Teriyaki sauce. Soy sauce, including reduced-sodium. Steak sauce. Fish sauce. Oyster sauce. Cocktail sauce. Horseradish that you find on the shelf. Regular ketchup and mustard. Meat flavorings and tenderizers. Bouillon cubes. Hot sauce. Pre-made or packaged marinades. Pre-made or packaged taco seasonings. Relishes. Regular salad dressings. Salsa. Other foods Salted popcorn and pretzels. Corn chips and puffs. Potato and tortilla chips. Canned or dried soups. Pizza. Frozen entrees and pot pies. The items listed above may not be a complete list of foods and beverages you should avoid. Contact a dietitian for more information. Summary Eating less sodium can help lower your blood pressure, reduce swelling, and protect your heart, liver, and kidneys. Most people on this plan should limit their sodium intake to 1,500-2,000 mg (milligrams) of sodium each day. Canned, boxed, and frozen foods are high in sodium. Restaurant foods, fast foods, and pizza are also very high in sodium. You also get sodium by adding salt to food. Try to cook at home, eat more fresh fruits and vegetables, and eat less fast food and canned, processed, or prepared foods. This information is not intended to replace advice given to you by your health care provider. Make sure you discuss any questions you have with your health care provider. Document Revised: 03/29/2019 Document Reviewed: 03/24/2019 Elsevier Patient Education  2023 Elsevier Inc.  Heart-Healthy Eating Plan Many factors influence your heart health, including eating and exercise habits. Heart health is also called coronary health. Coronary risk increases with abnormal  blood fat (lipid) levels. A heart-healthy eating plan includes limiting unhealthy fats, increasing healthy  fats, limiting salt (sodium) intake, and making other diet and lifestyle changes. What is my plan? Your health care provider may recommend that: You limit your fat intake to _________% or less of your total calories each day. You limit your saturated fat intake to _________% or less of your total calories each day. You limit the amount of cholesterol in your diet to less than _________ mg per day. You limit the amount of sodium in your diet to less than _________ mg per day. What are tips for following this plan? Cooking Cook foods using methods other than frying. Baking, boiling, grilling, and broiling are all good options. Other ways to reduce fat include: Removing the skin from poultry. Removing all visible fats from meats. Steaming vegetables in water or broth. Meal planning  At meals, imagine dividing your plate into fourths: Fill one-half of your plate with vegetables and green salads. Fill one-fourth of your plate with whole grains. Fill one-fourth of your plate with lean protein foods. Eat 2-4 cups of vegetables per day. One cup of vegetables equals 1 cup (91 g) broccoli or cauliflower florets, 2 medium carrots, 1 large bell pepper, 1 large sweet potato, 1 large tomato, 1 medium white potato, 2 cups (150 g) raw leafy greens. Eat 1-2 cups of fruit per day. One cup of fruit equals 1 small apple, 1 large banana, 1 cup (237 g) mixed fruit, 1 large orange,  cup (82 g) dried fruit, 1 cup (240 mL) 100% fruit juice. Eat more foods that contain soluble fiber. Examples include apples, broccoli, carrots, beans, peas, and barley. Aim to get 25-30 g of fiber per day. Increase your consumption of legumes, nuts, and seeds to 4-5 servings per week. One serving of dried beans or legumes equals  cup (90 g) cooked, 1 serving of nuts is  oz (12 almonds, 24 pistachios, or 7 walnut halves), and 1 serving of seeds equals  oz (8 g). Fats Choose healthy fats more often. Choose monounsaturated and  polyunsaturated fats, such as olive and canola oils, avocado oil, flaxseeds, walnuts, almonds, and seeds. Eat more omega-3 fats. Choose salmon, mackerel, sardines, tuna, flaxseed oil, and ground flaxseeds. Aim to eat fish at least 2 times each week. Check food labels carefully to identify foods with trans fats or high amounts of saturated fat. Limit saturated fats. These are found in animal products, such as meats, butter, and cream. Plant sources of saturated fats include palm oil, palm kernel oil, and coconut oil. Avoid foods with partially hydrogenated oils in them. These contain trans fats. Examples are stick margarine, some tub margarines, cookies, crackers, and other baked goods. Avoid fried foods. General information Eat more home-cooked food and less restaurant, buffet, and fast food. Limit or avoid alcohol. Limit foods that are high in added sugar and simple starches such as foods made using white refined flour (white breads, pastries, sweets). Lose weight if you are overweight. Losing just 5-10% of your body weight can help your overall health and prevent diseases such as diabetes and heart disease. Monitor your sodium intake, especially if you have high blood pressure. Talk with your health care provider about your sodium intake. Try to incorporate more vegetarian meals weekly. What foods should I eat? Fruits All fresh, canned (in natural juice), or frozen fruits. Vegetables Fresh or frozen vegetables (raw, steamed, roasted, or grilled). Green salads. Grains Most grains. Choose whole wheat and whole grains  most of the time. Rice and pasta, including brown rice and pastas made with whole wheat. Meats and other proteins Lean, well-trimmed beef, veal, pork, and lamb. Chicken and [place] without skin. All fish and shellfish. Wild duck, rabbit, pheasant, and venison. Egg whites or low-cholesterol egg substitutes. Dried beans, peas, lentils, and tofu. Seeds and most nuts. Dairy Low-fat or  nonfat cheeses, including ricotta and mozzarella. Skim or 1% milk (liquid, powdered, or evaporated). Buttermilk made with low-fat milk. Nonfat or low-fat yogurt. Fats and oils Non-hydrogenated (trans-free) margarines. Vegetable oils, including soybean, sesame, sunflower, olive, avocado, peanut, safflower, corn, canola, and cottonseed. Salad dressings or mayonnaise made with a vegetable oil. Beverages Water (mineral or sparkling). Coffee and tea. Unsweetened ice tea. Diet beverages. Sweets and desserts Sherbet, gelatin, and fruit ice. Small amounts of dark chocolate. Limit all sweets and desserts. Seasonings and condiments All seasonings and condiments. The items listed above may not be a complete list of foods and beverages you can eat. Contact a dietitian for more options. What foods should I avoid? Fruits Canned fruit in heavy syrup. Fruit in cream or butter sauce. Fried fruit. Limit coconut. Vegetables Vegetables cooked in cheese, cream, or butter sauce. Fried vegetables. Grains Breads made with saturated or trans fats, oils, or whole milk. Croissants. Sweet rolls. Donuts. High-fat crackers, such as cheese crackers and chips. Meats and other proteins Fatty meats, such as hot dogs, ribs, sausage, bacon, rib-eye roast or steak. High-fat deli meats, such as salami and bologna. Caviar. Domestic duck and goose. Organ meats, such as liver. Dairy Cream, sour cream, cream cheese, and creamed cottage cheese. Whole-milk cheeses. Whole or 2% milk (liquid, evaporated, or condensed). Whole buttermilk. Cream sauce or high-fat cheese sauce. Whole-milk yogurt. Fats and oils Meat fat, or shortening. Cocoa butter, hydrogenated oils, palm oil, coconut oil, palm kernel oil. Solid fats and shortenings, including bacon fat, salt pork, lard, and butter. Nondairy cream substitutes. Salad dressings with cheese or sour cream. Beverages Regular sodas and any drinks with added sugar. Sweets and desserts Frosting.  Pudding. Cookies. Cakes. Pies. Milk chocolate or white chocolate. Buttered syrups. Full-fat ice cream or ice cream drinks. The items listed above may not be a complete list of foods and beverages to avoid. Contact a dietitian for more information. Summary Heart-healthy meal planning includes limiting unhealthy fats, increasing healthy fats, limiting salt (sodium) intake and making other diet and lifestyle changes. Lose weight if you are overweight. Losing just 5-10% of your body weight can help your overall health and prevent diseases such as diabetes and heart disease. Focus on eating a balance of foods, including fruits and vegetables, low-fat or nonfat dairy, lean protein, nuts and legumes, whole grains, and heart-healthy oils and fats. This information is not intended to replace advice given to you by your health care provider. Make sure you discuss any questions you have with your health care provider. Document Revised: 05/28/2021 Document Reviewed: 05/28/2021 Elsevier Patient Education  2023 [employer]

## 2022-11-08 ENCOUNTER — Telehealth: Payer: Self-pay | Admitting: Internal Medicine

## 2022-11-08 NOTE — Telephone Encounter (Signed)
Returned patient's call regarding her concerns about her Imdur. She states she started this medication after her last visit with T. Conte on 08/15/22. She states that while it has improved her chest discomfort, she has a headache about 30 mins after she takes it. This headache lasts for 1-2 hours and improved only slightly by tylenol. Patient is asking if this is a medication she needs to stay on. She also verbalizes that she thinks her headaches may be impacted by chronic left shoulder pain and has a call into her PCP to get work up for this.   Reviewed with patient that Imdur was added to help with her CAD, as she has a lesion that is not accessible to PCI. Patient verbalizes understanding and states she is willing to continue taking the Imdur as she is able to complete most ADL's comfortably since she started it, but is wondering if she is a candidate for CABG if her most troublesome lesion is not amenable to PCI. Assured patient I would forward her question to T. Asa Lente.

## 2022-11-08 NOTE — Telephone Encounter (Signed)
Pt c/o medication issue:  1. Name of Medication: Isosorbide  2. How are you currently taking this medication (dosage and times per day)?   3. Are you having a reaction (difficulty breathing--STAT)?    4. What is your medication issue? Has a real bad headache, every time she takes it

## 2022-11-12 MED ORDER — ISOSORBIDE MONONITRATE ER 30 MG PO TB24: 15.0000 mg | ORAL_TABLET | Freq: Every day | ORAL | 3 refills | Status: DC

## 2022-11-12 NOTE — Telephone Encounter (Signed)
Called patient to discuss Cassandra Cooke's recommendations, patient agrees to try 15 mg dose of Imdur to see if side effects are more tolerable. Patient verbalizes understanding that lesion may not be amenable to graft or to PCI, states she will discuss with Dr. Tenny Craw at her visit in August. Encouraged patient to call if chest pain or headache was unalleviated on lower dose of Imdur, advised there are other drugs that may help such as Ranexa per T. Asa Lente, PA-C.  Patient verbalizes understanding.

## 2022-11-12 NOTE — Addendum Note (Signed)
Addended by: Luellen Pucker on: 11/12/2022 01:56 PM   Modules accepted: Orders

## 2022-12-09 ENCOUNTER — Other Ambulatory Visit: Payer: Self-pay | Admitting: Family Medicine

## 2022-12-15 NOTE — Progress Notes (Deleted)
Cardiology Office Note   Date:  12/15/2022   ID:  Cassandra, Cooke [birth year], MRN [id number]  PCP:  Elayne Guerin, MD  Cardiologist:   Dietrich Pates, MD   Pt presents for follow up, to discuss test results.     History of Present Illness: Cassandra Cooke is a 82 y.o. female with a history of HTN  She is seen at [facility]    Had echo done that showed Normal LVEF  LAE  RV mildly dilated, RVEF normal    Est RVSP was 49 mm Hg  Referred to cardiology I saw her for the first time in Feb 2024   She noted intermitt chest tightness with and without activity    SOme intermitt SOB with activity    No dizziness or syncope     Echo ordered   LVEF and RVEF normal  Est   RV pressure 50   Since seen the pt says shestill gets SOB with some activity    Has some chest tightness with activities     One year ago she did not have   I saw the pt in March 2024   He wasseen by Margaretha Glassing in April 2024  No outpatient medications have been marked as taking for the 12/18/22 encounter (Appointment) with Pricilla Riffle, MD.     Allergies:   Aspirin, Hctz [hydrochlorothiazide], and Lisinopril   Past Medical History:  Diagnosis Date   Allergy    Anxiety    during a period of social upheaval   Arthritis    Bloating    Cataract    Colon polyps    Diabetes mellitus type II    Borderline   Diverticulosis of colon    GERD (gastroesophageal reflux disease)    Heartburn    Hiatal hernia    Hypertension    Vitamin D deficiency     Past Surgical History:  Procedure Laterality Date   CATARACT EXTRACTION     Bil   COLONOSCOPY     DILATION AND CURETTAGE OF UTERUS     1 miscarriage   DILATION AND CURETTAGE OF UTERUS  01/2008   vaginal bleeding (Dr. Billy Coast)   OOPHORECTOMY  1965   right due to cysts   OOPHORECTOMY  1971   ovarian cystectomy left   POLYPECTOMY     RIGHT/LEFT HEART CATH AND CORONARY ANGIOGRAPHY N/A 07/26/2022   Procedure: RIGHT/LEFT HEART CATH AND CORONARY ANGIOGRAPHY;   Surgeon: [place], Peter M, MD;  Location: [facility];  Service: Cardiovascular;  Laterality: N/A;   VAGINAL DELIVERY     NSVD x 5     Social History:  The patient  reports that she quit smoking about 49 years ago. Her smoking use included cigarettes. She has never used smokeless tobacco. She reports that she does not drink alcohol and does not use drugs.   Family History:  The patient's family history includes Alcohol abuse in her sister and sister; Colon cancer in her son; Heart attack in her brother; Heart disease in her brother and father; Hypertension in her father and mother; Ulcers in her brother.    ROS:  Please see the history of present illness. All other systems are reviewed and  Negative to the above problem except as noted.    PHYSICAL EXAM: VS:  There were no vitals taken for this visit.  GEN: Obese 82 yo in NAD  HEENT: normal  Neck: no JVD, carotid bruit Cardiac: RRR  Normal S1, S2  No murmur,  No RV heave    Respiratory:  clear to auscultation bilaterally  GI: soft, nontender.  No hepatomegaly  Ext  No LE edema  EKG:  EKG is not ordered today        Lipid Panel    Component Value Date/Time   CHOL 207 (H) 11/16/2020 1017   TRIG 96.0 11/16/2020 1017   HDL 52.30 11/16/2020 1017   CHOLHDL 4 11/16/2020 1017   VLDL 19.2 11/16/2020 1017   LDLCALC 135 (H) 11/16/2020 1017   LDLDIRECT 140.5 06/14/2010 1057      Wt Readings from Last 3 Encounters:  08/15/22 193 lb 6.4 oz (87.7 kg)  07/26/22 153 lb (69.4 kg)  07/18/22 192 lb 3.2 oz (87.2 kg)      ASSESSMENT AND PLAN:  1  Dyspnea/ chest pressure The pt  was referred for pulmonary HTN on outside echo     She noted some dyspnea with exertion    Echo here LVEF and RVEF normal   RV systolic pressure estimated at 50. Talking to the pt she notes an h exertional component to symptoms    I would recomm R and L heart catheterization to evaluate anatomy and filling pressures    Risks / benefits described   Patient  understands and agrees to proceed.   WIll get preprocedure labs today     2  HTN   BP is controlled      3.  HL  Lipids in Jan 2024 LDL 77  On Lipitor 20mg    Follow for now   4  Atherosclerosis of aorta.   Risk factor modify  6   PreDM   A1C 6.3   Reviewed at last visit   Limit carbs        Current medicines are reviewed at length with the patient today.  The patient does not have concerns regarding medicines.  Signed, Dietrich Pates, MD  12/15/2022 9:15 PM    [facility] [address] [place], [place], [place]  [address] Phone: [phone]; Fax: [phone]

## 2022-12-17 DIAGNOSIS — M19012 Primary osteoarthritis, left shoulder: Secondary | ICD-10-CM | POA: Insufficient documentation

## 2022-12-18 ENCOUNTER — Ambulatory Visit: Payer: Medicare PPO | Admitting: Internal Medicine

## 2022-12-18 ENCOUNTER — Other Ambulatory Visit: Payer: Self-pay

## 2022-12-18 MED ORDER — ISOSORBIDE MONONITRATE ER 30 MG PO TB24: 15.0000 mg | ORAL_TABLET | Freq: Every day | ORAL | 3 refills | Status: DC

## 2022-12-18 NOTE — Telephone Encounter (Signed)
Pt's medication was sent to pt's pharmacy as requested. Confirmation received.  °

## 2022-12-30 NOTE — Progress Notes (Unsigned)
Office Visit    Patient Name: Cassandra Cooke Date of Encounter: 12/30/2022  PCP:  Elayne Guerin, MD   [facility]  Cardiologist:  Dietrich Pates, MD  Advanced Practice Provider:  No care team member to display Electrophysiologist:  None   HPI    Cassandra Cooke is a 82 y.o. female with a past medical history of hypertension, anxiety, GERD, hiatal hernia presents today for follow-up appointment.  She had an echocardiogram done that showed normal LVEF.  LAE.  RV mildly dilated, RVEF normal.  Estimated RVSP 49 mmHg.  Referred to cardiology.  Seen by Dr. Tenny Craw for the first time February 2024.  She had noted intermittent chest tightness with and without activity.  Some intermittent shortness of breath with activity.  No dizziness or syncope.  Echo ordered.  LVEF and RVEF normal.  Estimated RV pressure was 50 mmHg.  She was last seen July 18, 2022 and at that time she was still getting some short of breath with activity.  Had some chest tightness with activity.  1 year ago she did not experience this.  Right and left heart catheterization was recommended.  Cardiac catheterization with mid RCA lesion of 35%, distal RCA lesion of 80%, mid LAD lesion of 40%, LV end-diastolic pressure was normal.  Plan for medical therapy.  If refractory angina occurs, could consider PCI of the distal RCA but would be complex due to tortuosity and because lesion is at bifurcation.  She was seen by me 08/15/2022, she tells me that she has had occasional tightness in her chest (about two times in the last few weeks since her cardiac cath). She does not have nitro to use so she did not take one. We discussed her cath and why there was not a stent placed in her distal RCA. Otherwise, she is doing well and is open to trying medication for her coronary artery.    Today, she***   Past Medical History    Past Medical History:  Diagnosis Date   Allergy    Anxiety    during a period of  social upheaval   Arthritis    Bloating    Cataract    Colon polyps    Diabetes mellitus type II    Borderline   Diverticulosis of colon    GERD (gastroesophageal reflux disease)    Heartburn    Hiatal hernia    Hypertension    Vitamin D deficiency    Past Surgical History:  Procedure Laterality Date   CATARACT EXTRACTION     Bil   COLONOSCOPY     DILATION AND CURETTAGE OF UTERUS     1 miscarriage   DILATION AND CURETTAGE OF UTERUS  01/2008   vaginal bleeding (Dr. Billy Coast)   OOPHORECTOMY  1965   right due to cysts   OOPHORECTOMY  1971   ovarian cystectomy left   POLYPECTOMY     RIGHT/LEFT HEART CATH AND CORONARY ANGIOGRAPHY N/A 07/26/2022   Procedure: RIGHT/LEFT HEART CATH AND CORONARY ANGIOGRAPHY;  Surgeon: [place], Peter M, MD;  Location: [facility];  Service: Cardiovascular;  Laterality: N/A;   VAGINAL DELIVERY     NSVD x 5    Allergies  Allergies  Allergen Reactions   Aspirin     GI upset with high dose   Hctz [Hydrochlorothiazide] Other (See Comments)    cramping   Lisinopril     Causes cough   EKGs/Labs/Other Studies Reviewed:   The following  studies were reviewed today: Diagnostic Dominance: Right Left Anterior Descending  The vessel is moderately tortuous.  Mid LAD lesion is 40% stenosed.    Left Circumflex  Vessel was injected. Vessel is normal in caliber. Vessel is angiographically normal.    Right Coronary Artery  The vessel is moderately tortuous.  Mid RCA lesion is 35% stenosed.  Dist RCA lesion is 80% stenosed.    Intervention   No interventions have been documented.   Left Heart  Left Ventricle LV end diastolic pressure is normal.   Coronary Diagrams  Diagnostic Dominance: Right  Intervention   EKG:  EKG is not ordered today.    Recent Labs: 07/18/2022: Platelets 326 07/22/2022: BUN 10; Creatinine, Ser 0.73 07/26/2022: Hemoglobin 13.3; Potassium 3.9; Sodium 141  Recent Lipid Panel    Component Value Date/Time    CHOL 207 (H) 11/16/2020 1017   TRIG 96.0 11/16/2020 1017   HDL 52.30 11/16/2020 1017   CHOLHDL 4 11/16/2020 1017   VLDL 19.2 11/16/2020 1017   LDLCALC 135 (H) 11/16/2020 1017   LDLDIRECT 140.5 06/14/2010 1057     Home Medications   No outpatient medications have been marked as taking for the 12/31/22 encounter (Appointment) with Sharlene Dory, PA-C.     Review of Systems      All other systems reviewed and are otherwise negative except as noted above.  Physical Exam    VS:  There were no vitals taken for this visit. , BMI There is no height or weight on file to calculate BMI.  Wt Readings from Last 3 Encounters:  08/15/22 193 lb 6.4 oz (87.7 kg)  07/26/22 153 lb (69.4 kg)  07/18/22 192 lb 3.2 oz (87.2 kg)     GEN: Well nourished, well developed, in no acute distress. HEENT: normal. Neck: Supple, no JVD, carotid bruits, or masses. Cardiac: RRR, no murmurs, rubs, or gallops. No clubbing, cyanosis, edema.  Radials/PT 2+ and equal bilaterally.  Respiratory:  Respirations regular and unlabored, clear to auscultation bilaterally. GI: Soft, nontender, nondistended. MS: No deformity or atrophy. Skin: Warm and dry, no rash. Neuro:  Strength and sensation are intact. Psych: Normal affect.  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  Signed, Sharlene Dory, PA-C 12/30/2022, 9:15 PM  [facility]

## 2022-12-31 ENCOUNTER — Telehealth: Payer: Self-pay | Admitting: Licensed Clinical Social Worker

## 2022-12-31 ENCOUNTER — Encounter: Payer: Self-pay | Admitting: Physician Assistant

## 2022-12-31 ENCOUNTER — Ambulatory Visit: Payer: Medicare HMO | Attending: Internal Medicine | Admitting: Physician Assistant

## 2022-12-31 VITALS — BP 152/82 | HR 79 | Ht 63.0 in | Wt 187.6 lb

## 2022-12-31 DIAGNOSIS — I2510 Atherosclerotic heart disease of native coronary artery without angina pectoris: Secondary | ICD-10-CM

## 2022-12-31 DIAGNOSIS — I10 Essential (primary) hypertension: Secondary | ICD-10-CM | POA: Diagnosis not present

## 2022-12-31 DIAGNOSIS — K219 Gastro-esophageal reflux disease without esophagitis: Secondary | ICD-10-CM

## 2022-12-31 MED ORDER — ISOSORBIDE MONONITRATE ER 30 MG PO TB24: 45.0000 mg | ORAL_TABLET | Freq: Every day | ORAL | 3 refills | Status: DC

## 2022-12-31 NOTE — Patient Instructions (Signed)
Medication Instructions:  Increase Imdur to 45 mg daily, this will be one and one half of the 30 mg tablets daily *If you need a refill on your cardiac medications before your next appointment, please call your pharmacy*  Lab Work: Labs from primary care provider If you have labs (blood work) drawn today and your tests are completely normal, you will receive your results only by: MyChart Message (if you have MyChart) OR A paper copy in the mail If you have any lab test that is abnormal or we need to change your treatment, we will call you to review the results.  Follow-Up: At [facility], you and your health needs are our priority.  As part of our continuing mission to provide you with exceptional heart care, we have created designated Provider Care Teams.  These Care Teams include your primary Cardiologist (physician) and Advanced Practice Providers (APPs -  Physician Assistants and Nurse Practitioners) who all work together to provide you with the care you need, when you need it.  Your next appointment:   6 month(s)  Provider:   Dietrich Pates, MD  or Jari Favre, PA-C        Heart-Healthy Eating Plan Many factors influence your heart health, including eating and exercise habits. Heart health is also called coronary health. Coronary risk increases with abnormal blood fat (lipid) levels. A heart-healthy eating plan includes limiting unhealthy fats, increasing healthy fats, limiting salt (sodium) intake, and making other diet and lifestyle changes. What is my plan? Your health care provider may recommend that: You limit your fat intake to _________% or less of your total calories each day. You limit your saturated fat intake to _________% or less of your total calories each day. You limit the amount of cholesterol in your diet to less than _________ mg per day. You limit the amount of sodium in your diet to less than _________ mg per day. What are tips for following this  plan? Cooking Cook foods using methods other than frying. Baking, boiling, grilling, and broiling are all good options. Other ways to reduce fat include: Removing the skin from poultry. Removing all visible fats from meats. Steaming vegetables in water or broth. Meal planning  At meals, imagine dividing your plate into fourths: Fill one-half of your plate with vegetables and green salads. Fill one-fourth of your plate with whole grains. Fill one-fourth of your plate with lean protein foods. Eat 2-4 cups of vegetables per day. One cup of vegetables equals 1 cup (91 g) broccoli or cauliflower florets, 2 medium carrots, 1 large bell pepper, 1 large sweet potato, 1 large tomato, 1 medium white potato, 2 cups (150 g) raw leafy greens. Eat 1-2 cups of fruit per day. One cup of fruit equals 1 small apple, 1 large banana, 1 cup (237 g) mixed fruit, 1 large orange,  cup (82 g) dried fruit, 1 cup (240 mL) 100% fruit juice. Eat more foods that contain soluble fiber. Examples include apples, broccoli, carrots, beans, peas, and barley. Aim to get 25-30 g of fiber per day. Increase your consumption of legumes, nuts, and seeds to 4-5 servings per week. One serving of dried beans or legumes equals  cup (90 g) cooked, 1 serving of nuts is  oz (12 almonds, 24 pistachios, or 7 walnut halves), and 1 serving of seeds equals  oz (8 g). Fats Choose healthy fats more often. Choose monounsaturated and polyunsaturated fats, such as olive and canola oils, avocado oil, flaxseeds, walnuts, almonds, and  seeds. Eat more omega-3 fats. Choose salmon, mackerel, sardines, tuna, flaxseed oil, and ground flaxseeds. Aim to eat fish at least 2 times each week. Check food labels carefully to identify foods with trans fats or high amounts of saturated fat. Limit saturated fats. These are found in animal products, such as meats, butter, and cream. Plant sources of saturated fats include palm oil, palm kernel oil, and coconut  oil. Avoid foods with partially hydrogenated oils in them. These contain trans fats. Examples are stick margarine, some tub margarines, cookies, crackers, and other baked goods. Avoid fried foods. General information Eat more home-cooked food and less restaurant, buffet, and fast food. Limit or avoid alcohol. Limit foods that are high in added sugar and simple starches such as foods made using white refined flour (white breads, pastries, sweets). Lose weight if you are overweight. Losing just 5-10% of your body weight can help your overall health and prevent diseases such as diabetes and heart disease. Monitor your sodium intake, especially if you have high blood pressure. Talk with your health care provider about your sodium intake. Try to incorporate more vegetarian meals weekly. What foods should I eat? Fruits All fresh, canned (in natural juice), or frozen fruits. Vegetables Fresh or frozen vegetables (raw, steamed, roasted, or grilled). Green salads. Grains Most grains. Choose whole wheat and whole grains most of the time. Rice and pasta, including brown rice and pastas made with whole wheat. Meats and other proteins Lean, well-trimmed beef, veal, pork, and lamb. Chicken and [place] without skin. All fish and shellfish. Wild duck, rabbit, pheasant, and venison. Egg whites or low-cholesterol egg substitutes. Dried beans, peas, lentils, and tofu. Seeds and most nuts. Dairy Low-fat or nonfat cheeses, including ricotta and mozzarella. Skim or 1% milk (liquid, powdered, or evaporated). Buttermilk made with low-fat milk. Nonfat or low-fat yogurt. Fats and oils Non-hydrogenated (trans-free) margarines. Vegetable oils, including soybean, sesame, sunflower, olive, avocado, peanut, safflower, corn, canola, and cottonseed. Salad dressings or mayonnaise made with a vegetable oil. Beverages Water (mineral or sparkling). Coffee and tea. Unsweetened ice tea. Diet beverages. Sweets and  desserts Sherbet, gelatin, and fruit ice. Small amounts of dark chocolate. Limit all sweets and desserts. Seasonings and condiments All seasonings and condiments. The items listed above may not be a complete list of foods and beverages you can eat. Contact a dietitian for more options. What foods should I avoid? Fruits Canned fruit in heavy syrup. Fruit in cream or butter sauce. Fried fruit. Limit coconut. Vegetables Vegetables cooked in cheese, cream, or butter sauce. Fried vegetables. Grains Breads made with saturated or trans fats, oils, or whole milk. Croissants. Sweet rolls. Donuts. High-fat crackers, such as cheese crackers and chips. Meats and other proteins Fatty meats, such as hot dogs, ribs, sausage, bacon, rib-eye roast or steak. High-fat deli meats, such as salami and bologna. Caviar. Domestic duck and goose. Organ meats, such as liver. Dairy Cream, sour cream, cream cheese, and creamed cottage cheese. Whole-milk cheeses. Whole or 2% milk (liquid, evaporated, or condensed). Whole buttermilk. Cream sauce or high-fat cheese sauce. Whole-milk yogurt. Fats and oils Meat fat, or shortening. Cocoa butter, hydrogenated oils, palm oil, coconut oil, palm kernel oil. Solid fats and shortenings, including bacon fat, salt pork, lard, and butter. Nondairy cream substitutes. Salad dressings with cheese or sour cream. Beverages Regular sodas and any drinks with added sugar. Sweets and desserts Frosting. Pudding. Cookies. Cakes. Pies. Milk chocolate or white chocolate. Buttered syrups. Full-fat ice cream or ice cream drinks. The items listed  above may not be a complete list of foods and beverages to avoid. Contact a dietitian for more information. Summary Heart-healthy meal planning includes limiting unhealthy fats, increasing healthy fats, limiting salt (sodium) intake and making other diet and lifestyle changes. Lose weight if you are overweight. Losing just 5-10% of your body weight can help  your overall health and prevent diseases such as diabetes and heart disease. Focus on eating a balance of foods, including fruits and vegetables, low-fat or nonfat dairy, lean protein, nuts and legumes, whole grains, and heart-healthy oils and fats. This information is not intended to replace advice given to you by your health care provider. Make sure you discuss any questions you have with your health care provider. Document Revised: 05/28/2021 Document Reviewed: 05/28/2021 Elsevier Patient Education  2024 Elsevier Inc. Low-Sodium Eating Plan Salt (sodium) helps you keep a healthy balance of fluids in your body. Too much sodium can raise your blood pressure. It can also cause fluid and waste to be held in your body. Your health care provider or dietitian may recommend a low-sodium eating plan if you have high blood pressure (hypertension), kidney disease, liver disease, or heart failure. Eating less sodium can help lower your blood pressure and reduce swelling. It can also protect your heart, liver, and kidneys. What are tips for following this plan? Reading food labels  Check food labels for the amount of sodium per serving. If you eat more than one serving, you must multiply the listed amount by the number of servings. Choose foods with less than 140 milligrams (mg) of sodium per serving. Avoid foods with 300 mg of sodium or more per serving. Always check how much sodium is in a product, even if the label says "unsalted" or "no salt added." Shopping  Buy products labeled as "low-sodium" or "no salt added." Buy fresh foods. Avoid canned foods and pre-made or frozen meals. Avoid canned, cured, or processed meats. Buy breads that have less than 80 mg of sodium per slice. Cooking  Eat more home-cooked food. Try to eat less restaurant, buffet, and fast food. Try not to add salt when you cook. Use salt-free seasonings or herbs instead of table salt or sea salt. Check with your provider or  pharmacist before using salt substitutes. Cook with plant-based oils, such as canola, sunflower, or olive oil. Meal planning When eating at a restaurant, ask if your food can be made with less salt or no salt. Avoid dishes labeled as brined, pickled, cured, or smoked. Avoid dishes made with soy sauce, miso, or teriyaki sauce. Avoid foods that have monosodium glutamate (MSG) in them. MSG may be added to some restaurant food, sauces, soups, bouillon, and canned foods. Make meals that can be grilled, baked, poached, roasted, or steamed. These are often made with less sodium. General information Try to limit your sodium intake to 1,500-2,300 mg each day, or the amount told by your provider. What foods should I eat? Fruits Fresh, frozen, or canned fruit. Fruit juice. Vegetables Fresh or frozen vegetables. "No salt added" canned vegetables. "No salt added" tomato sauce and paste. Low-sodium or reduced-sodium tomato and vegetable juice. Grains Low-sodium cereals, such as oats, puffed wheat and rice, and shredded wheat. Low-sodium crackers. Unsalted rice. Unsalted pasta. Low-sodium bread. Whole grain breads and whole grain pasta. Meats and other proteins Fresh or frozen meat, poultry, seafood, and fish. These should have no added salt. Low-sodium canned tuna and salmon. Unsalted nuts. Dried peas, beans, and lentils without added salt. Unsalted canned  beans. Eggs. Unsalted nut butters. Dairy Milk. Soy milk. Cheese that is naturally low in sodium, such as ricotta cheese, fresh mozzarella, or Swiss cheese. Low-sodium or reduced-sodium cheese. Cream cheese. Yogurt. Seasonings and condiments Fresh and dried herbs and spices. Salt-free seasonings. Low-sodium mustard and ketchup. Sodium-free salad dressing. Sodium-free light mayonnaise. Fresh or refrigerated horseradish. Lemon juice. Vinegar. Other foods Homemade, reduced-sodium, or low-sodium soups. Unsalted popcorn and pretzels. Low-salt or salt-free  chips. The items listed above may not be all the foods and drinks you can have. Talk to a dietitian to learn more. What foods should I avoid? Vegetables Sauerkraut, pickled vegetables, and relishes. Olives. [place] fries. Onion rings. Regular canned vegetables, except low-sodium or reduced-sodium items. Regular canned tomato sauce and paste. Regular tomato and vegetable juice. Frozen vegetables in sauces. Grains Instant hot cereals. Bread stuffing, pancake, and biscuit mixes. Croutons. Seasoned rice or pasta mixes. Noodle soup cups. Boxed or frozen macaroni and cheese. Regular salted crackers. Self-rising flour. Meats and other proteins Meat or fish that is salted, canned, smoked, spiced, or pickled. Precooked or cured meat, such as sausages or meat loaves. Tomasa Blase. Ham. Pepperoni. Hot dogs. Corned beef. Chipped beef. Salt pork. Jerky. Pickled herring, anchovies, and sardines. Regular canned tuna. Salted nuts. Dairy Processed cheese and cheese spreads. Hard cheeses. Cheese curds. Blue cheese. Feta cheese. String cheese. Regular cottage cheese. Buttermilk. Canned milk. Fats and oils Salted butter. Regular margarine. Ghee. Bacon fat. Seasonings and condiments Onion salt, garlic salt, seasoned salt, table salt, and sea salt. Canned and packaged gravies. Worcestershire sauce. Tartar sauce. Barbecue sauce. Teriyaki sauce. Soy sauce, including reduced-sodium soy sauce. Steak sauce. Fish sauce. Oyster sauce. Cocktail sauce. Horseradish that you find on the shelf. Regular ketchup and mustard. Meat flavorings and tenderizers. Bouillon cubes. Hot sauce. Pre-made or packaged marinades. Pre-made or packaged taco seasonings. Relishes. Regular salad dressings. Salsa. Other foods Salted popcorn and pretzels. Corn chips and puffs. Potato and tortilla chips. Canned or dried soups. Pizza. Frozen entrees and pot pies. The items listed above may not be all the foods and drinks you should avoid. Talk to a dietitian to  learn more. This information is not intended to replace advice given to you by your health care provider. Make sure you discuss any questions you have with your health care provider. Document Revised: 05/09/2022 Document Reviewed: 05/09/2022 Elsevier Patient Education  2024 [employer].

## 2022-12-31 NOTE — Progress Notes (Signed)
Heart and Vascular Care Navigation  12/31/2022  Cassandra Cooke 01-10-1941 [id number]  Reason for Referral: personal care services   Engaged with patient by telephone for initial visit for Heart and Vascular Care Coordination.                                                                                                   Assessment:                                     LCSW received call Cooke from pt. Introduced self, role, reason for call. Pt confirmed home address, updated PCP to Cassandra Back, NP, at [facility]. She is widowed and lives alone. She doesn't use any DME to get around but occasionally will use her cane. Pt denies any issues with obtaining or affording food, housing, utilities, or medications. She has transportation to all her appointments. Receives [employer] income only. She doesn't think she has ever applied for Medicaid. Isn't sure if she will meet criteria. She is interested in home assistance with things such as cleaning and house tasks. We discussed difference between home health and personal care services as well as potential funding for these services.  We discussed reviewing current guidelines and reaching out to [facility] program/DSS if eligible for any Medicare or Medicaid programs respectively. I also encouraged her to reach out to me if any additional questions as well about these programs. Also suggested contacting her Medicare coverage to see if there is any benefits (usually there is not/these are Medicaid dependent).  If she is ineligible for either coverage above then we discussed it may be private pay for her to get in home services. I will send personal care service packet and encouraged her to make note of what kind of services she is looking for and how often and use the list to reach out to a few companies to see who may be able to meet her needs.   No additional questions or concerns voiced when asked at this time.    HRT/VAS Care Coordination      Patients Home Cardiology Office [facility]   Outpatient Care Team Social Worker   Social Worker Name: Octavio Graves, [place], [phone]   Living arrangements for the past 2 months Single Family Home   Patient Current Insurance Coverage Managed Medicare   Patient Has Concern With Paying Medical Bills No   Does Patient Have Prescription Coverage? Yes       Social History:                                                                             SDOH Screenings   Food Insecurity: No Food Insecurity (12/31/2022)  Housing:  Low Risk  (12/31/2022)  Transportation Needs: No Transportation Needs (12/31/2022)  Utilities: Not At Risk (12/31/2022)  Alcohol Screen: Low Risk  (11/30/2020)  Depression (PHQ2-9): Low Risk  (02/25/2022)  Financial Resource Strain: Low Risk  (12/31/2022)  Physical Activity: Inactive (02/25/2022)  Social Connections: Unknown (03/13/2022)   [facility]  Stress: No Stress Concern Present (02/25/2022)  Tobacco Use: Medium Risk (12/31/2022)  Health Literacy: Adequate Health Literacy (12/31/2022)    SDOH Interventions: Financial Resources:  Financial Strain Interventions: Intervention Not Indicated  Food Insecurity:  Food Insecurity Interventions: Intervention Not Indicated  Housing Insecurity:  Housing Interventions: Intervention Not Indicated  Transportation:   Transportation Interventions: Intervention Not Indicated     Other Care Navigation Interventions:     Provided Pharmacy assistance resources  [place] [facility], [place], Personal Care Services    Follow-up plan:   LCSW has mailed pt the following: my card, [employer], Medicaid and Extra Help information for 2024, and Personal Care Services list. I will f/u in the next few weeks to provide a check in and answer any additional questions.

## 2022-12-31 NOTE — Telephone Encounter (Signed)
H&V Care Navigation CSW Progress Note  Clinical Social Worker contacted patient by phone to f/u after appt to check in on community resources specifically Personal Care Services. No answer initially and I left a voicemail requesting a call back. Pt returned my call but was at a restaurant getting breakfast and requested we speak later. I will give her a call about 1pm to check in.  Patient is participating in a Managed Medicaid Plan:  No, [employer] Medicare only  SDOH Screenings   Food Insecurity: No Food Insecurity (02/25/2022)  Housing: Low Risk  (11/30/2020)  Transportation Needs: No Transportation Needs (02/25/2022)  Alcohol Screen: Low Risk  (11/30/2020)  Depression (PHQ2-9): Low Risk  (02/25/2022)  Financial Resource Strain: Low Risk  (02/25/2022)  Physical Activity: Inactive (02/25/2022)  Social Connections: Unknown (03/13/2022)   Received from [facility], [facility]  Stress: No Stress Concern Present (02/25/2022)  Tobacco Use: Medium Risk (12/31/2022)   Octavio Graves, MSW, LCSW Clinical Social Worker II [facility] Health Heart/Vascular Care Navigation  [phone]- work cell phone (preferred) [phone]- desk phone

## 2022-12-31 NOTE — Progress Notes (Signed)
H&V Care Navigation CSW Progress Note  Clinical Social Worker contacted patient by phone to f/u as discussed at 1pm (pt was still out at store when I called at 11:49am). No answer at [phone], left voicemail again. Will re-attempt again as able should pt not return my call today.  Patient is participating in a Managed Medicaid Plan:  No, [employer] Medicare only  SDOH Screenings   Food Insecurity: No Food Insecurity (02/25/2022)  Housing: Low Risk  (11/30/2020)  Transportation Needs: No Transportation Needs (02/25/2022)  Alcohol Screen: Low Risk  (11/30/2020)  Depression (PHQ2-9): Low Risk  (02/25/2022)  Financial Resource Strain: Low Risk  (02/25/2022)  Physical Activity: Inactive (02/25/2022)  Social Connections: Unknown (03/13/2022)   Received from [facility], [facility]  Stress: No Stress Concern Present (02/25/2022)  Tobacco Use: Medium Risk (12/31/2022)    Cassandra Cooke, MSW, LCSW Clinical Social Worker II [facility] Health Heart/Vascular Care Navigation  [phone]- work cell phone (preferred) [phone]- desk phone

## 2023-01-02 ENCOUNTER — Other Ambulatory Visit: Payer: Self-pay | Admitting: Family Medicine

## 2023-01-09 ENCOUNTER — Telehealth (HOSPITAL_BASED_OUTPATIENT_CLINIC_OR_DEPARTMENT_OTHER): Payer: Self-pay | Admitting: Licensed Clinical Social Worker

## 2023-01-09 NOTE — Telephone Encounter (Signed)
H&V Care Navigation CSW Progress Note  Clinical Social Worker contacted patient by phone to f/u after appt to check in on community resources specifically Personal Care Services. No answer  and I left a voicemail requesting a call back. Remain available should pt have any additional questions. Sent her resources and my card. Will re-attempt again as able to reach pt.    Patient is participating in a Managed Medicaid Plan:  No, [employer] Medicare only  SDOH Screenings   Food Insecurity: No Food Insecurity (12/31/2022)  Housing: Low Risk  (12/31/2022)  Transportation Needs: No Transportation Needs (12/31/2022)  Utilities: Not At Risk (12/31/2022)  Alcohol Screen: Low Risk  (11/30/2020)  Depression (PHQ2-9): Low Risk  (02/25/2022)  Financial Resource Strain: Low Risk  (12/31/2022)  Physical Activity: Inactive (02/25/2022)  Social Connections: Unknown (03/13/2022)   Received from [facility], [facility]  Stress: No Stress Concern Present (02/25/2022)  Tobacco Use: Medium Risk (12/31/2022)  Health Literacy: Adequate Health Literacy (12/31/2022)   Octavio Graves, MSW, LCSW Clinical Social Worker II [facility] Health Heart/Vascular Care Navigation  [phone]- work cell phone (preferred) [phone]- desk phone

## 2023-01-13 ENCOUNTER — Telehealth: Payer: Self-pay | Admitting: Licensed Clinical Social Worker

## 2023-01-13 NOTE — Telephone Encounter (Signed)
H&V Care Navigation CSW Progress Note  Clinical Social Worker contacted patient by phone to f/u after appt to check in on community resources specifically Personal Care Services. No answer again this afternoon and I left another voicemail requesting a call back if any additional questions. Remain available should pt have any additional questions. Sent her resources and my card.   Patient is participating in a Managed Medicaid Plan:  No, [employer] Medicare only  SDOH Screenings   Food Insecurity: No Food Insecurity (12/31/2022)  Housing: Low Risk  (12/31/2022)  Transportation Needs: No Transportation Needs (12/31/2022)  Utilities: Not At Risk (12/31/2022)  Alcohol Screen: Low Risk  (11/30/2020)  Depression (PHQ2-9): Low Risk  (02/25/2022)  Financial Resource Strain: Low Risk  (12/31/2022)  Physical Activity: Inactive (02/25/2022)  Social Connections: Unknown (03/13/2022)   Received from [facility], [facility]  Stress: No Stress Concern Present (02/25/2022)  Tobacco Use: Medium Risk (12/31/2022)  Health Literacy: Adequate Health Literacy (12/31/2022)    Octavio Graves, MSW, LCSW Clinical Social Worker II [facility] Health Heart/Vascular Care Navigation  [phone]- work cell phone (preferred) [phone]- desk phone

## 2023-01-21 ENCOUNTER — Encounter: Payer: Self-pay | Admitting: Gastroenterology

## 2023-02-21 ENCOUNTER — Telehealth: Payer: Self-pay | Admitting: Internal Medicine

## 2023-02-21 ENCOUNTER — Other Ambulatory Visit (HOSPITAL_COMMUNITY): Payer: Self-pay

## 2023-02-21 MED ORDER — ISOSORBIDE MONONITRATE ER 30 MG PO TB24
45.0000 mg | ORAL_TABLET | Freq: Every day | ORAL | 3 refills | Status: DC
  Filled 2023-02-21: qty 135, 90d supply, fill #0

## 2023-02-21 NOTE — Telephone Encounter (Signed)
*  STAT* If patient is at the pharmacy, call can be transferred to refill team.   1. Which medications need to be refilled? (please list name of each medication and dose if known)   isosorbide mononitrate (IMDUR) 30 MG 24 hr tablet   2. Would you like to learn more about the convenience, safety, & potential cost savings by using the [facility]?   3. Are you open to using the [facility] (Type [facility]. ).  4. Which pharmacy/location (including street and city if local pharmacy) is medication to be sent to?  [id number] - [place], [place] - [address]   5. Do they need a 30 day or 90 day supply?   90 day  Patient stated she is completely out of this medication.  Patient has appointment scheduled on 07/03/23.

## 2023-03-05 ENCOUNTER — Other Ambulatory Visit (HOSPITAL_COMMUNITY): Payer: Self-pay

## 2023-03-08 ENCOUNTER — Other Ambulatory Visit: Payer: Self-pay | Admitting: Family Medicine

## 2023-07-01 NOTE — Progress Notes (Deleted)
 Office Visit    Patient Name: Cassandra Cooke Date of Encounter: 07/01/2023  PCP:  [name], NP   [facility]  Cardiologist:  Dietrich Pates, MD  Advanced Practice Provider:  No care team member to display Electrophysiologist:  None   HPI    Cassandra Cooke is a 83 y.o. female with a past medical history of hypertension, anxiety, GERD, hiatal hernia presents today for follow-up appointment.  She had an echocardiogram done that showed normal LVEF.  LAE.  RV mildly dilated, RVEF normal.  Estimated RVSP 49 mmHg.  Referred to cardiology.  Seen by Dr. Tenny Craw for the first time February 2024.  She had noted intermittent chest tightness with and without activity.  Some intermittent shortness of breath with activity.  No dizziness or syncope.  Echo ordered.  LVEF and RVEF normal.  Estimated RV pressure was 50 mmHg.  She was last seen July 18, 2022 and at that time she was still getting some short of breath with activity.  Had some chest tightness with activity.  1 year ago she did not experience this.  Right and left heart catheterization was recommended.  Cardiac catheterization with mid RCA lesion of 35%, distal RCA lesion of 80%, mid LAD lesion of 40%, LV end-diastolic pressure was normal.  Plan for medical therapy.  If refractory angina occurs, could consider PCI of the distal RCA but would be complex due to tortuosity and because lesion is at bifurcation.  She was seen by me 08/15/2022, she tells me that she has had occasional tightness in her chest (about two times in the last few weeks since her cardiac cath). She does not have nitro to use so she did not take one. We discussed her cath and why there was not a stent placed in her distal RCA. Otherwise, she is doing well and is open to trying medication for her coronary artery.   She was seen by me 12/31/2022, she tells me she was doing very well and then the last 3 weeks she has been slowing down quite a bit.  Her  primary care put a [employer] monitor on her.  She feels like she has less energy to do the things she wants to do.  Having more chest tightness with activity.  We discussed several options and we reviewed her cardiac catheterization again.  Unfortunately, the blockage that she had was too torturous to put a stent in.  She also is in need of a home health aide to assist her with cleaning, cooking, daily tasks.  Unsure if this would be something insurance would cover or if she would need to pay out-of-pocket but will consult Amy Nedra Hai to go over resources with her.  Reports no shortness of breath nor dyspnea on exertion. No edema, orthopnea, PND. Reports no palpitations.    Today, she***   Past Medical History    Past Medical History:  Diagnosis Date   Allergy    Anxiety    during a period of social upheaval   Arthritis    Bloating    Cataract    Colon polyps    Diabetes mellitus type II    Borderline   Diverticulosis of colon    GERD (gastroesophageal reflux disease)    Heartburn    Hiatal hernia    Hypertension    Vitamin D deficiency    Past Surgical History:  Procedure Laterality Date   CATARACT EXTRACTION     Bil  COLONOSCOPY     DILATION AND CURETTAGE OF UTERUS     1 miscarriage   DILATION AND CURETTAGE OF UTERUS  01/2008   vaginal bleeding (Dr. Billy Coast)   OOPHORECTOMY  1965   right due to cysts   OOPHORECTOMY  1971   ovarian cystectomy left   POLYPECTOMY     RIGHT/LEFT HEART CATH AND CORONARY ANGIOGRAPHY N/A 07/26/2022   Procedure: RIGHT/LEFT HEART CATH AND CORONARY ANGIOGRAPHY;  Surgeon: [place], Peter M, MD;  Location: [facility] INVASIVE CV LAB;  Service: Cardiovascular;  Laterality: N/A;   VAGINAL DELIVERY     NSVD x 5    Allergies  Allergies  Allergen Reactions   Aspirin     GI upset with high dose   Hctz [Hydrochlorothiazide] Other (See Comments)    cramping   Lisinopril     Causes cough   EKGs/Labs/Other Studies Reviewed:   The following studies were  reviewed today: Diagnostic Dominance: Right Left Anterior Descending  The vessel is moderately tortuous.  Mid LAD lesion is 40% stenosed.    Left Circumflex  Vessel was injected. Vessel is normal in caliber. Vessel is angiographically normal.    Right Coronary Artery  The vessel is moderately tortuous.  Mid RCA lesion is 35% stenosed.  Dist RCA lesion is 80% stenosed.    Intervention   No interventions have been documented.   Left Heart  Left Ventricle LV end diastolic pressure is normal.   Coronary Diagrams  Diagnostic Dominance: Right  Intervention   EKG:  EKG is not ordered today.    Recent Labs: 07/18/2022: Platelets 326 07/22/2022: BUN 10; Creatinine, Ser 0.73 07/26/2022: Hemoglobin 13.3; Potassium 3.9; Sodium 141  Recent Lipid Panel    Component Value Date/Time   CHOL 207 (H) 11/16/2020 1017   TRIG 96.0 11/16/2020 1017   HDL 52.30 11/16/2020 1017   CHOLHDL 4 11/16/2020 1017   VLDL 19.2 11/16/2020 1017   LDLCALC 135 (H) 11/16/2020 1017   LDLDIRECT 140.5 06/14/2010 1057     Home Medications   No outpatient medications have been marked as taking for the 07/03/23 encounter (Appointment) with Sharlene Dory, PA-C.     Review of Systems      All other systems reviewed and are otherwise negative except as noted above.  Physical Exam    VS:  There were no vitals taken for this visit. , BMI There is no height or weight on file to calculate BMI.  Wt Readings from Last 3 Encounters:  12/31/22 187 lb 9.6 oz (85.1 kg)  08/15/22 193 lb 6.4 oz (87.7 kg)  07/26/22 153 lb (69.4 kg)     GEN: Well nourished, well developed, in no acute distress. HEENT: normal. Neck: Supple, no JVD, carotid bruits, or masses. Cardiac: RRR, no murmurs, rubs, or gallops. No clubbing, cyanosis, edema.  Radials/PT 2+ and equal bilaterally.  Respiratory:  Respirations regular and unlabored, clear to auscultation bilaterally. GI: Soft, nontender, nondistended. MS: No deformity or  atrophy. Skin: Warm and dry, no rash. Neuro:  Strength and sensation are intact. Psych: Normal affect.  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  Signed, Sharlene Dory, PA-C 07/01/2023, 8:34 PM  [facility]

## 2023-07-03 ENCOUNTER — Ambulatory Visit: Payer: Medicare HMO | Attending: Physician Assistant | Admitting: Physician Assistant

## 2023-07-03 DIAGNOSIS — K219 Gastro-esophageal reflux disease without esophagitis: Secondary | ICD-10-CM

## 2023-07-03 DIAGNOSIS — I10 Essential (primary) hypertension: Secondary | ICD-10-CM

## 2023-07-03 DIAGNOSIS — I2510 Atherosclerotic heart disease of native coronary artery without angina pectoris: Secondary | ICD-10-CM

## 2023-07-10 ENCOUNTER — Encounter (HOSPITAL_COMMUNITY): Payer: Self-pay | Admitting: *Deleted

## 2023-07-10 ENCOUNTER — Other Ambulatory Visit: Payer: Self-pay

## 2023-07-10 ENCOUNTER — Ambulatory Visit (HOSPITAL_COMMUNITY): Admission: EM | Admit: 2023-07-10 | Discharge: 2023-07-10 | Disposition: A | Discharge status: Home / Self Care

## 2023-07-10 DIAGNOSIS — J069 Acute upper respiratory infection, unspecified: Secondary | ICD-10-CM | POA: Diagnosis not present

## 2023-07-10 LAB — POC COVID19/FLU A&B COMBO
Covid Antigen, POC: NEGATIVE
Influenza A Antigen, POC: NEGATIVE
Influenza B Antigen, POC: NEGATIVE

## 2023-07-10 MED ORDER — AZELASTINE HCL 0.1 % NA SOLN: 1.0000 | Freq: Two times a day (BID) | NASAL | 1 refills | Status: AC

## 2023-07-10 MED ORDER — BENZONATATE 200 MG PO CAPS: 200.0000 mg | ORAL_CAPSULE | Freq: Three times a day (TID) | ORAL | 0 refills | Status: DC | PRN

## 2023-07-10 NOTE — ED Triage Notes (Signed)
 PT reports she is fatigued ,vomiting and has a cough. Pt fell the other night and was to weak to get up. SX's started on Sunday night.

## 2023-07-10 NOTE — ED Provider Notes (Signed)
 [facility]  Note:  This document was prepared using Dragon voice recognition software and may include unintentional dictation errors.  MRN: [id number] DOB: [birth year]  Subjective:   Cassandra Cooke is a 83 y.o. female presenting for persistent cough, fatigue, vomiting, mild weakness x 4 days.  Patient reports that she has been using Delsym cough syrup and Tylenol with mild improvement of the symptoms.  Patient states her husband is also sick with similar symptoms his symptoms began before hers started.  Patient states that she was too weak to get up at night to get up when she got down which is never happened to her before.  No current facility-administered medications for this encounter.  Current Outpatient Medications:    acetaminophen (TYLENOL) 500 MG tablet, Take 1,000 mg by mouth every 8 (eight) hours as needed for moderate pain., Disp: , Rfl:    amLODipine (NORVASC) 10 MG tablet, Take 1 tablet (10 mg total) by mouth daily., Disp: 90 tablet, Rfl: 3   atorvastatin (LIPITOR) 20 MG tablet, Take 20 mg by mouth daily., Disp: , Rfl:    azelastine (ASTELIN) 0.1 % nasal spray, Place 1 spray into both nostrils 2 (two) times daily. Use in each nostril as directed, Disp: 30 mL, Rfl: 1   benzonatate (TESSALON) 200 MG capsule, Take 1 capsule (200 mg total) by mouth 3 (three) times daily as needed for cough., Disp: 20 capsule, Rfl: 0   isosorbide mononitrate (IMDUR) 30 MG 24 hr tablet, Take 1.5 tablets (45 mg total) by mouth daily., Disp: 135 tablet, Rfl: 3   melatonin 5 MG TABS, Take 5 mg by mouth at bedtime as needed (sleep)., Disp: , Rfl:    metoprolol succinate (TOPROL-XL) 50 MG 24 hr tablet, TAKE 2 TABLETS BY MOUTH EVERY DAY, Disp: 180 tablet, Rfl: 1   Vitamin D, Ergocalciferol, (DRISDOL) 1.25 MG (50000 UNIT) CAPS capsule, Take 50,000 Units by mouth every Friday., Disp: , Rfl:    Blood Glucose Monitoring Suppl (ACCU-CHEK GUIDE) w/Device KIT, as directed., Disp: , Rfl:     cetirizine (ZYRTEC) 10 MG tablet, Take 1 tablet (10 mg total) by mouth daily. (Patient taking differently: Take 10 mg by mouth daily as needed for allergies.), Disp: 30 tablet, Rfl: 0   Fluocinolone Acetonide 0.01 % OIL, Place 1 drop into the left ear as needed., Disp: , Rfl:    fluticasone (FLONASE) 50 MCG/ACT nasal spray, Place 1 spray into both nostrils daily. (Patient taking differently: Place 1 spray into both nostrils daily as needed for allergies.), Disp: 16 g, Rfl: 1   hydrOXYzine (ATARAX) 10 MG tablet, Take 10 mg by mouth 2 (two) times daily as needed., Disp: , Rfl:    omeprazole (PRILOSEC) 40 MG capsule, TAKE 1 CAPSULE BY MOUTH TWICE A DAY AS NEEDED (Patient taking differently: Take 40 mg by mouth in the morning and at bedtime.), Disp: 180 capsule, Rfl: 0   Allergies  Allergen Reactions   Aspirin     GI upset with high dose   Hctz [Hydrochlorothiazide] Other (See Comments)    cramping   Lisinopril     Causes cough    Past Medical History:  Diagnosis Date   Allergy    Anxiety    during a period of social upheaval   Arthritis    Bloating    Cataract    Colon polyps    Diabetes mellitus type II    Borderline   Diverticulosis of colon    GERD (gastroesophageal reflux disease)  Heartburn    Hiatal hernia    Hypertension    Vitamin D deficiency      Past Surgical History:  Procedure Laterality Date   CATARACT EXTRACTION     Bil   COLONOSCOPY     DILATION AND CURETTAGE OF UTERUS     1 miscarriage   DILATION AND CURETTAGE OF UTERUS  01/2008   vaginal bleeding (Dr. Billy Coast)   OOPHORECTOMY  1965   right due to cysts   OOPHORECTOMY  1971   ovarian cystectomy left   POLYPECTOMY     RIGHT/LEFT HEART CATH AND CORONARY ANGIOGRAPHY N/A 07/26/2022   Procedure: RIGHT/LEFT HEART CATH AND CORONARY ANGIOGRAPHY;  Surgeon: [place], Peter M, MD;  Location: [facility] INVASIVE CV LAB;  Service: Cardiovascular;  Laterality: N/A;   VAGINAL DELIVERY     NSVD x 5    Family History   Problem Relation Age of Onset   Hypertension Mother    Heart disease Father        MI   Hypertension Father    Ulcers Brother        PUD   Alcohol abuse Sister    Alcohol abuse Sister    Colon cancer Son    Heart disease Brother    Heart attack Brother    Breast cancer Neg Hx    Esophageal cancer Neg Hx    Rectal cancer Neg Hx    Stomach cancer Neg Hx     Social History   Tobacco Use   Smoking status: Former    Current packs/day: 0.00    Types: Cigarettes    Quit date: 07/16/1973    Years since quitting: 50.0   Smokeless tobacco: Never   Tobacco comments:    quit over 40 years ago  Vaping Use   Vaping status: Never Used  Substance Use Topics   Alcohol use: No   Drug use: No    ROS Refer to HPI for ROS details.  Objective:   Vitals: BP (!) 160/98   Pulse 78   Temp 99.2 F (37.3 C)   Resp 20   SpO2 98%   Physical Exam Vitals and nursing note reviewed.  Constitutional:      General: She is not in acute distress.    Appearance: Normal appearance. She is well-developed. She is not ill-appearing or toxic-appearing.  HENT:     Head: Normocephalic.     Right Ear: Tympanic membrane, ear canal and external ear normal.     Left Ear: Tympanic membrane, ear canal and external ear normal.     Nose: Congestion and rhinorrhea present.     Mouth/Throat:     Mouth: Mucous membranes are moist.     Pharynx: Oropharynx is clear. Posterior oropharyngeal erythema present. No oropharyngeal exudate.  Eyes:     General:        Right eye: No discharge.        Left eye: No discharge.     Extraocular Movements: Extraocular movements intact.     Conjunctiva/sclera: Conjunctivae normal.     Pupils: Pupils are equal, round, and reactive to light.  Cardiovascular:     Rate and Rhythm: Normal rate and regular rhythm.     Heart sounds: No murmur heard. Pulmonary:     Effort: Pulmonary effort is normal. No respiratory distress.     Breath sounds: Normal breath sounds. No  stridor. No wheezing, rhonchi or rales.  Skin:    General: Skin is warm and dry.  Capillary Refill: Capillary refill takes less than 2 seconds.  Neurological:     General: No focal deficit present.     Mental Status: She is alert and oriented to person, place, and time.  Psychiatric:        Mood and Affect: Mood normal.     Procedures  Results for orders placed or performed during the hospital encounter of 07/10/23 (from the past 24 hours)  POC Covid19/Flu A&B Antigen     Status: None   Collection Time: 07/10/23  5:14 PM  Result Value Ref Range   Influenza A Antigen, POC Negative Negative   Influenza B Antigen, POC Negative Negative   Covid Antigen, POC Negative Negative    Assessment and Plan :   PDMP not reviewed this encounter.  1. Viral URI with cough    Viral URI with cough -Rapid testing for COVID and influenza are both negative. -Cause of symptoms is most likely viral related to other viral illnesses in the community. -Prescribed azelastine nasal spray 0.1% twice daily as needed for cough and postnasal drip -Benzonatate 200 mg capsule 3 times daily as needed for cough suppression -Continue using Delsym cough syrup as directed for cough suppression with benzonatate. -Take ibuprofen or Tylenol as needed for inflammation and pain secondary to viral illness. -If symptoms worsen or new symptoms arise follow-up for further evaluation and management  Mirna Mires B, NP 07/10/23 1732

## 2023-07-10 NOTE — Discharge Instructions (Addendum)
 Viral URI with cough -Rapid testing for COVID and influenza are both negative. -Cause of symptoms is most likely viral related to other viral illnesses in the community. -Prescribed azelastine nasal spray 0.1% twice daily as needed for cough and postnasal drip -Benzonatate 200 mg capsule 3 times daily as needed for cough suppression -Continue using Delsym cough syrup as directed for cough suppression with benzonatate. -Take ibuprofen or Tylenol as needed for inflammation and pain secondary to viral illness. -If symptoms worsen or new symptoms arise follow-up for further evaluation and management

## 2023-07-25 ENCOUNTER — Other Ambulatory Visit: Payer: Self-pay | Admitting: Registered Nurse

## 2023-07-25 DIAGNOSIS — Z1231 Encounter for screening mammogram for malignant neoplasm of breast: Secondary | ICD-10-CM

## 2023-08-15 ENCOUNTER — Ambulatory Visit

## 2023-08-15 ENCOUNTER — Ambulatory Visit
Admission: RE | Admit: 2023-08-15 | Discharge: 2023-08-15 | Disposition: A | Discharge status: Home / Self Care | Source: Ambulatory Visit | Attending: Registered Nurse | Admitting: Registered Nurse

## 2023-08-15 DIAGNOSIS — Z1231 Encounter for screening mammogram for malignant neoplasm of breast: Secondary | ICD-10-CM

## 2023-09-01 NOTE — Progress Notes (Unsigned)
 Cardiology Office Note    Patient Name: Cassandra Cooke Date of Encounter: 09/02/2023  Primary Care Provider:  Hershell Lose, NP Primary Cardiologist:  Ola Berger, MD Primary Electrophysiologist: None   Past Medical History    Past Medical History:  Diagnosis Date   Allergy    Anxiety    during a period of social upheaval   Arthritis    Bloating    Cataract    Colon polyps    Diabetes mellitus type II    Borderline   Diverticulosis of colon    GERD (gastroesophageal reflux disease)    Heartburn    Hiatal hernia    Hypertension    Vitamin D  deficiency     History of Present Illness  Cassandra Cooke is a 83 y.o. female with a PMH of CAD, HTN, GERD, DM type II, anxiety, HLD who presents today for follow-up.  Cassandra Cooke was seen initially by Dr. Avanell Bob by referral of her PCP for abnormal echocardiogram and pulmonary HTN.  During visit patient reported intermittent chest tightness as well as shortness of breath with activity.  She underwent repeat 2D echo that showed EF of 60-65% with no RWMA and mild-moderate LVH with grade 1 DD and elevated PASP of 49.6 mild dilated RA/LA and mild MR.  She was seen in follow-up on 07/18/2022 and continued to have complaint of exertional shortness of breath and chest pain.  She underwent R/LHC on 07/26/2022 that showed single-vessel obstructive disease in the distal RCA 80% stenosis with 40% mid LAD stenosis and 35% mid RCA stenosis with plan for medical therapy and possible PCI however difficult due to tortuosity and bifurcation location.  She was seen in follow-up by Lovette Rud, PA on 08/15/22 and reported intermittent chest pain and Imdur  30 mg was added.  She was seen at 18-month follow-up and reported doing well but had a decrease in energy and primary care doctor had her wear a [employer] monitor.  During her visit she was advised to monitor chest tightness and Imdur  was increased to 45 mg daily.  She also inquired regarding assistance with home  care and was referred to social work.  She was seen on 07/10/2023 and treated for URI in the ED.  Cassandra Cooke presents today with her husband for follow-up.  At today's visit her blood pressure was elevated at 182/102 and 188/102 on recheck.  She reports prior to today's visit not taking her medication and reports some inconsistency with times and dosages of medicine through the week. She has a history of hypertension with blood pressure readings sometimes reaching 180/80 mmHg at home. She experiences headaches and jitteriness when her blood pressure is elevated, often associating these symptoms with stress. Her current antihypertensive regimen includes metoprolol , hydralazine, and amlodipine .  She reports dietary indiscretions with salt and pork.  Her husband also reports inconsistencies with junk food and lack of exercise.  She experiences dizziness, especially when standing up quickly, which she attributes to her medications. She also experiences occasional swelling in her ankles. She has a history of high cholesterol, with her LDL at 77 mg/dL as of 0981. She is concerned about her cholesterol and A1C levels and follows up with Dr. Nguyen for these issue. Patient denies chest pain, palpitations, dyspnea, PND, orthopnea, nausea, vomiting, dizziness, syncope, edema, weight gain, or early satiety.  Discussed the use of AI scribe software for clinical note transcription with the patient, who gave verbal consent to proceed.  History of Present Illness  Review of Systems  Please see the history of present illness.    All other systems reviewed and are otherwise negative except as noted above.  Physical Exam    Wt Readings from Last 3 Encounters:  09/02/23 189 lb 9.6 oz (86 kg)  12/31/22 187 lb 9.6 oz (85.1 kg)  08/15/22 193 lb 6.4 oz (87.7 kg)   VS: Vitals:   09/02/23 0932 09/02/23 1028  BP: (!) 182/102 (!) 188/102  Pulse: 64   SpO2: 100%   ,Body mass index is 33.59 kg/m. GEN: Well nourished,  well developed in no acute distress Neck: No JVD; No carotid bruits Pulmonary: Clear to auscultation without rales, wheezing or rhonchi  Cardiovascular: Normal rate. Regular rhythm. Normal S1. Normal S2.   Murmurs: There is no murmur.  ABDOMEN: Soft, non-tender, non-distended EXTREMITIES:  No edema; No deformity   EKG/LABS/ Recent Cardiac Studies   ECG personally reviewed by me today -sinus rhythm with rate of 62 bpm and no acute changes consistent with previous EKG.  Risk Assessment/Calculations:          Lab Results  Component Value Date   WBC 7.1 07/18/2022   HGB 13.3 07/26/2022   HCT 39.0 07/26/2022   MCV 86 07/18/2022   PLT 326 07/18/2022   Lab Results  Component Value Date   CREATININE 0.73 07/22/2022   BUN 10 07/22/2022   NA 141 07/26/2022   K 3.9 07/26/2022   CL 104 07/22/2022   CO2 24 07/22/2022   Lab Results  Component Value Date   CHOL 207 (H) 11/16/2020   HDL 52.30 11/16/2020   LDLCALC 135 (H) 11/16/2020   LDLDIRECT 140.5 06/14/2010   TRIG 96.0 11/16/2020   CHOLHDL 4 11/16/2020    Lab Results  Component Value Date   HGBA1C 6.3 11/16/2020   Assessment & Plan    1.  History of CAD: -s/p [facility] 07/26/2022 with distal RCA 80% stenosis with tortuosity and at bifurcation difficult for PCI. - . Intermittent chest pain controlled with medication. - Continue Imdur  45 mg daily. - Continue metoprolol  50 mg twice daily as prescribed. - Encourage cardiovascular exercise. - Monitor for new or worsening chest pain symptoms.  2.  Essential hypertension: - HYPERTENSION CONTROL Vitals:   09/02/23 0932 09/02/23 1028  BP: (!) 182/102 (!) 188/102    The patient's blood pressure is elevated above target today.  In order to address the patient's elevated BP: Blood pressure will be monitored at home to determine if medication changes need to be made.; A current anti-hypertensive medication was adjusted today.; Follow up with general cardiology has been recommended.      - Increase hydralazine to 100 mg twice daily. - Monitor blood pressure at home and log readings for two weeks. - Bring blood pressure cuff to next appointment for calibration. - Follow DASH diet to reduce sodium intake. - Refer to hypertension clinic if blood pressure goals are not met  3.  Hyperlipidemia: - Patient's last LDL cholesterol was 77 -Emphasis on diet and exercise to further reduce LDL levels. Discussed dietary modifications including reducing pork and salt intake. - Continue Lipitor 20 mg daily. - Encourage dietary modifications to reduce LDL, including reducing pork and salt intake. - Encourage regular exercise to improve lipid profile.  4.  Dizziness -Reports dizziness, particularly when standing up quickly. Likely related to medication effects, including metoprolol  and Imdur , which can cause vasodilation and reduced heart rate. Advised to stand up slowly to mitigate dizziness. - Advise standing up  slowly to mitigate dizziness. - Monitor for increase in frequency or severity of dizziness.  5.  Shortness of breath: - Patient reports control of shortness of breath with current medication management.  Disposition: Follow-up with Ola Berger, MD or APP in 3 months    Signed, Francene Ing, Retha Cast, NP 09/02/2023, 1:11 PM [facility]

## 2023-09-02 ENCOUNTER — Encounter: Payer: Self-pay | Admitting: Nurse Practitioner

## 2023-09-02 ENCOUNTER — Ambulatory Visit: Attending: Nurse Practitioner | Admitting: Nurse Practitioner

## 2023-09-02 VITALS — BP 188/102 | HR 64 | Ht 63.0 in | Wt 189.6 lb

## 2023-09-02 DIAGNOSIS — I10 Essential (primary) hypertension: Secondary | ICD-10-CM | POA: Diagnosis not present

## 2023-09-02 DIAGNOSIS — E785 Hyperlipidemia, unspecified: Secondary | ICD-10-CM | POA: Diagnosis not present

## 2023-09-02 DIAGNOSIS — I2510 Atherosclerotic heart disease of native coronary artery without angina pectoris: Secondary | ICD-10-CM

## 2023-09-02 DIAGNOSIS — R0602 Shortness of breath: Secondary | ICD-10-CM

## 2023-09-02 MED ORDER — ATORVASTATIN CALCIUM 20 MG PO TABS: 20.0000 mg | ORAL_TABLET | Freq: Every day | ORAL | 3 refills | Status: AC

## 2023-09-02 MED ORDER — HYDRALAZINE HCL 100 MG PO TABS: 100.0000 mg | ORAL_TABLET | Freq: Two times a day (BID) | ORAL | 3 refills | Status: DC

## 2023-09-02 MED ORDER — ISOSORBIDE MONONITRATE ER 30 MG PO TB24: 45.0000 mg | ORAL_TABLET | Freq: Every day | ORAL | 3 refills | Status: AC

## 2023-09-02 NOTE — Patient Instructions (Addendum)
 Medication Instructions:  START Hydralazine 100mg  Take 1 tablet twice a day  *If you need a refill on your cardiac medications before your next appointment, please call your pharmacy*  Lab Work: None Ordered If you have labs (blood work) drawn today and your tests are completely normal, you will receive your results only by: MyChart Message (if you have MyChart) OR A paper copy in the mail If you have any lab test that is abnormal or we need to change your treatment, we will call you to review the results.  Testing/Procedures: None ordered  Follow-Up: At [facility], you and your health needs are our priority.  As part of our continuing mission to provide you with exceptional heart care, our providers are all part of one team.  This team includes your primary Cardiologist (physician) and Advanced Practice Providers or APPs (Physician Assistants and Nurse Practitioners) who all work together to provide you with the care you need, when you need it.  Your next appointment:   3 month(s)  Provider:   Charles Connor, NP     We recommend signing up for the patient portal called "MyChart".  Sign up information is provided on this After Visit Summary.  MyChart is used to connect with patients for Virtual Visits (Telemedicine).  Patients are able to view lab/test results, encounter notes, upcoming appointments, etc.  Non-urgent messages can be sent to your provider as well.   To learn more about what you can do with MyChart, go to [email or web address].   Other Instructions Blood pressure goal is 130/80 Check your blood pressure daily for 2 weeks, then contact the office with your readings.  Contact the office either by phone or MyChart with your readings.  Make sure to check your blood pressure 2 hours after taking your medications.   AVOID these things for 30 minutes before checking your blood pressure: No Drinking caffeine. No Drinking alcohol. No Eating. No Smoking. No  Exercising.  Five minutes before checking your blood pressure: Pee. Sit in a dining chair. Avoid sitting in a soft couch or armchair. Be quiet. Do not talk.

## 2023-09-16 ENCOUNTER — Ambulatory Visit: Admitting: Physician Assistant

## 2023-09-16 NOTE — Progress Notes (Deleted)
 [place]    09/16/2023 RITTIE MARUT [id number] [birth year]  Referring provider: Hershell Lose, NP Primary GI doctor: (Dr. Howard Macho)  ASSESSMENT AND PLAN:  Personal history of colon polyps and family history of colon cancer, recall 5 years 01/2018 colonoscopy Dr. Howard Macho 2 sessile polyps 2 to 3 mm cecum multiple diverticula left colon internal hemorrhoids  GERD  Hepatic steatosis Seen on abdominal ultrasound 2013 Last LFT check 2022 no elevated LFTs normal platelets  CAD with angina on Imdur  07/26/2018 for right and left heart catheterization single-vessel obstructive disease distal RCA 80% 40% mid LAD 35% mid RCA medical therapy, started on Imdur  Recent visit with Dr. Avanell Bob  Type 2 diabetes  Patient Care Team: Hershell Lose, NP as PCP - General (Nurse Practitioner) Elmyra Haggard, MD as PCP - Cardiology (Cardiology)  HISTORY OF PRESENT ILLNESS: 83 y.o. female with a past medical history listed below presents for evaluation of ***.   *** Discussed the use of AI scribe software for clinical note transcription with the patient, who gave verbal consent to proceed.  History of Present Illness            She  reports that she quit smoking about 50 years ago. Her smoking use included cigarettes. She has never used smokeless tobacco. She reports that she does not drink alcohol and does not use drugs.  RELEVANT GI HISTORY, IMAGING AND LABS: Results          CBC    Component Value Date/Time   WBC 7.1 07/18/2022 0900   WBC 7.0 10/02/2018 1248   RBC 4.67 07/18/2022 0900   RBC 4.62 10/02/2018 1248   HGB 13.3 07/26/2022 0803   HGB 12.9 07/18/2022 0900   HCT 39.0 07/26/2022 0803   HCT 40.2 07/18/2022 0900   PLT 326 07/18/2022 0900   MCV 86 07/18/2022 0900   MCH 27.6 07/18/2022 0900   MCH 27.5 10/02/2018 1248   MCHC 32.1 07/18/2022 0900   MCHC 31.8 10/02/2018 1248   RDW 12.6 07/18/2022 0900   LYMPHSABS 3.7 10/02/2018 1248   MONOABS 0.6 10/02/2018 1248   EOSABS 0.1 10/02/2018  1248   BASOSABS 0.0 10/02/2018 1248   No results for input(s): "HGB" in the last 8760 hours.  CMP     Component Value Date/Time   NA 141 07/26/2022 0803   NA 137 07/22/2022 1119   K 3.9 07/26/2022 0803   CL 104 07/22/2022 1119   CO2 24 07/22/2022 1119   GLUCOSE 104 (H) 07/22/2022 1119   GLUCOSE 92 11/16/2020 1017   BUN 10 07/22/2022 1119   CREATININE 0.73 07/22/2022 1119   CALCIUM  10.3 07/22/2022 1119   PROT 7.4 11/16/2020 1017   ALBUMIN 4.3 11/16/2020 1017   AST 21 11/16/2020 1017   ALT 20 11/16/2020 1017   ALKPHOS 86 11/16/2020 1017   BILITOT 0.4 11/16/2020 1017   GFRNONAA >60 10/02/2018 1248   GFRAA >60 10/02/2018 1248      Latest Ref Rng & Units 11/16/2020   10:17 AM 06/29/2019    9:04 AM 08/25/2017    8:55 AM  Hepatic Function  Total Protein 6.0 - 8.3 g/dL 7.4  7.7  7.8   Albumin 3.5 - 5.2 g/dL 4.3  4.3  4.3   AST 0 - 37 U/L 21  21  22    ALT 0 - 35 U/L 20  22  20    Alk Phosphatase 39 - 117 U/L 86  83  86   Total Bilirubin 0.2 - 1.2 mg/dL  0.4  0.3  0.3       Current Medications:    Current Outpatient Medications (Cardiovascular):    amLODipine  (NORVASC ) 10 MG tablet, Take 1 tablet (10 mg total) by mouth daily.   atorvastatin  (LIPITOR) 20 MG tablet, Take 1 tablet (20 mg total) by mouth daily.   hydrALAZINE  (APRESOLINE ) 100 MG tablet, Take 1 tablet (100 mg total) by mouth 2 (two) times daily.   isosorbide  mononitrate (IMDUR ) 30 MG 24 hr tablet, Take 1.5 tablets (45 mg total) by mouth daily.   metoprolol  succinate (TOPROL -XL) 50 MG 24 hr tablet, TAKE 2 TABLETS BY MOUTH EVERY DAY  Current Outpatient Medications (Respiratory):    azelastine  (ASTELIN ) 0.1 % nasal spray, Place 1 spray into both nostrils 2 (two) times daily. Use in each nostril as directed   cetirizine  (ZYRTEC ) 10 MG tablet, Take 1 tablet (10 mg total) by mouth daily. (Patient taking differently: Take 10 mg by mouth daily as needed for allergies.)   fluticasone  (FLONASE ) 50 MCG/ACT nasal spray, Place 1  spray into both nostrils daily. (Patient taking differently: Place 1 spray into both nostrils daily as needed for allergies.)  Current Outpatient Medications (Analgesics):    acetaminophen  (TYLENOL ) 500 MG tablet, Take 1,000 mg by mouth every 8 (eight) hours as needed for moderate pain.   Current Outpatient Medications (Other):    Blood Glucose Monitoring Suppl (ACCU-CHEK GUIDE) w/Device KIT, as directed.   Fluocinolone Acetonide 0.01 % OIL, Place 1 drop into the left ear as needed.   hydrOXYzine (ATARAX) 10 MG tablet, Take 10 mg by mouth 2 (two) times daily as needed.   melatonin 5 MG TABS, Take 5 mg by mouth at bedtime as needed (sleep).   omeprazole  (PRILOSEC) 40 MG capsule, TAKE 1 CAPSULE BY MOUTH TWICE A DAY AS NEEDED (Patient taking differently: Take 40 mg by mouth in the morning and at bedtime.)   Vitamin D , Ergocalciferol , (DRISDOL ) 1.25 MG (50000 UNIT) CAPS capsule, Take 50,000 Units by mouth every Friday.  Medical History:  Past Medical History:  Diagnosis Date   Allergy    Anxiety    during a period of social upheaval   Arthritis    Bloating    Cataract    Colon polyps    Diabetes mellitus type II    Borderline   Diverticulosis of colon    GERD (gastroesophageal reflux disease)    Heartburn    Hiatal hernia    Hypertension    Vitamin D  deficiency    Allergies:  Allergies  Allergen Reactions   Aspirin      GI upset with high dose   Hctz [Hydrochlorothiazide ] Other (See Comments)    cramping   Lisinopril     Causes cough     Surgical History:  She  has a past surgical history that includes Vaginal delivery; Dilation and curettage of uterus; Oophorectomy (1965); Oophorectomy (1971); Dilation and curettage of uterus (01/2008); Colonoscopy; Polypectomy; Cataract extraction; and RIGHT/LEFT HEART CATH AND CORONARY ANGIOGRAPHY (N/A, 07/26/2022). Family History:  Her family history includes Alcohol abuse in her sister and sister; Colon cancer in her son; Heart attack in  her brother; Heart disease in her brother and father; Hypertension in her father and mother; Ulcers in her brother.  REVIEW OF SYSTEMS  : All other systems reviewed and negative except where noted in the History of Present Illness.  PHYSICAL EXAM: There were no vitals taken for this visit. Physical Exam          Mylinda Asa  Idalia Mais, PA-C 11:54 AM

## 2023-11-05 ENCOUNTER — Ambulatory Visit: Admitting: Physician Assistant

## 2023-11-20 ENCOUNTER — Telehealth: Payer: Self-pay | Admitting: Nurse Practitioner

## 2023-11-20 NOTE — Telephone Encounter (Signed)
 Patient states she feels anxious, like something is about to happen. She checked her BP/HR this morning: 177/71, HR 85. She was concerned about these numbers. Explained to patient her HR is WNL, and while her SBP is elevated this could be a response to her feeling anxious/stressed.  Patient asked if we could prescribe her something for anxiety. Informed her we cannot and advised her to call her PCP to see if they would be willing to prescribe her medication for her anxiety. Patient verbalized understanding.  Patient is scheduled for overdue 3 month F/U on 8/19 with APP.

## 2023-11-20 NOTE — Telephone Encounter (Signed)
 Pt c/o Shortness Of Breath: STAT if SOB developed within the last 24 hours or pt is noticeably SOB on the phone  1. Are you currently SOB (can you hear that pt is SOB on the phone)? No   2. How long have you been experiencing SOB? Started yesterday   3. Are you SOB when sitting or when up moving around? Both   4. Are you currently experiencing any other symptoms?  She states her bp was 177/71 hr 83 yesterday. She states it feels like something is going to happen like stress. Please advise.   Scheduled for 3 mon f/u 12/23/23

## 2023-12-01 ENCOUNTER — Ambulatory Visit: Admitting: Internal Medicine

## 2023-12-22 NOTE — Progress Notes (Deleted)
 Cardiology Office Note    Patient Name: Cassandra Cooke Date of Encounter: 12/22/2023  Primary Care Provider:  Leontine Cramp, NP Primary Cardiologist:  Vina Gull, MD Primary Electrophysiologist: None   Past Medical History    Past Medical History:  Diagnosis Date   Allergy    Anxiety    during a period of social upheaval   Arthritis    Bloating    Cataract    Colon polyps    Diabetes mellitus type II    Borderline   Diverticulosis of colon    GERD (gastroesophageal reflux disease)    Heartburn    Hiatal hernia    Hypertension    Vitamin D  deficiency     History of Present Illness  Cassandra Cooke is a 83 y.o. female with a PMH of CAD, HTN, GERD, DM type II, anxiety, HLD who presents today for follow-up.   Cassandra Cooke was last seen on 09/02/2023 for follow-up and was noted to have elevated blood pressures.  She had hydralazine  increased to 100 mg twice daily was referred to the hypertension clinic.  She also noted intermittent chest pain that was controlled with Imdur  at 45 mg daily.   Patient denies chest pain, palpitations, dyspnea, PND, orthopnea, nausea, vomiting, dizziness, syncope, edema, weight gain, or early satiety.   Discussed the use of AI scribe software for clinical note transcription with the patient, who gave verbal consent to proceed.  History of Present Illness    ***Notes:   Review of Systems  Please see the history of present illness.    All other systems reviewed and are otherwise negative except as noted above.  Physical Exam    Wt Readings from Last 3 Encounters:  09/02/23 189 lb 9.6 oz (86 kg)  12/31/22 187 lb 9.6 oz (85.1 kg)  08/15/22 193 lb 6.4 oz (87.7 kg)   CD:Uyzmz were no vitals filed for this visit.,There is no height or weight on file to calculate BMI. GEN: Well nourished, well developed in no acute distress Neck: No JVD; No carotid bruits Pulmonary: Clear to auscultation without rales, wheezing or rhonchi   Cardiovascular: Normal rate. Regular rhythm. Normal S1. Normal S2.   Murmurs: There is no murmur.  ABDOMEN: Soft, non-tender, non-distended EXTREMITIES:  No edema; No deformity   EKG/LABS/ Recent Cardiac Studies   ECG personally reviewed by me today - ***  Risk Assessment/Calculations:   {Does this patient have ATRIAL FIBRILLATION?:[id number]}      Lab Results  Component Value Date   WBC 7.1 07/18/2022   HGB 13.3 07/26/2022   HCT 39.0 07/26/2022   MCV 86 07/18/2022   PLT 326 07/18/2022   Lab Results  Component Value Date   CREATININE 0.73 07/22/2022   BUN 10 07/22/2022   NA 141 07/26/2022   K 3.9 07/26/2022   CL 104 07/22/2022   CO2 24 07/22/2022   Lab Results  Component Value Date   CHOL 207 (H) 11/16/2020   HDL 52.30 11/16/2020   LDLCALC 135 (H) 11/16/2020   LDLDIRECT 140.5 06/14/2010   TRIG 96.0 11/16/2020   CHOLHDL 4 11/16/2020    Lab Results  Component Value Date   HGBA1C 6.3 11/16/2020   Assessment & Plan    Assessment and Plan Assessment & Plan     1.  History of CAD: -s/p [facility] 07/26/2022 with distal RCA 80% stenosis with tortuosity and at bifurcation difficult for PCI.  2.  Essential hypertension  3. Shortness of breath: - Patient reports  control of shortness of breath with current medication management.  4.Hyperlipidemia: - Patient's last LDL cholesterol was 77      Disposition: Follow-up with Vina Gull, MD or APP in *** months {Are you ordering a CV Procedure (e.g. stress test, cath, DCCV, TEE, etc)?   Press F2        :[id number]}   Signed, Wyn Raddle, Jackee Shove, NP 12/22/2023, 1:21 PM [facility]

## 2023-12-23 ENCOUNTER — Ambulatory Visit: Admitting: Nurse Practitioner

## 2023-12-23 DIAGNOSIS — E785 Hyperlipidemia, unspecified: Secondary | ICD-10-CM

## 2023-12-23 DIAGNOSIS — I2510 Atherosclerotic heart disease of native coronary artery without angina pectoris: Secondary | ICD-10-CM

## 2023-12-23 DIAGNOSIS — R0602 Shortness of breath: Secondary | ICD-10-CM

## 2023-12-23 DIAGNOSIS — I10 Essential (primary) hypertension: Secondary | ICD-10-CM

## 2023-12-23 NOTE — Progress Notes (Unsigned)
 Cardiology Office Note    Patient Name: Cassandra Cooke Date of Encounter: 12/23/2023  Primary Care Provider:  Leontine Cramp, NP Primary Cardiologist:  Vina Gull, MD Primary Electrophysiologist: None   Past Medical History    Past Medical History:  Diagnosis Date   Allergy    Anxiety    during a period of social upheaval   Arthritis    Bloating    Cataract    Colon polyps    Diabetes mellitus type II    Borderline   Diverticulosis of colon    GERD (gastroesophageal reflux disease)    Heartburn    Hiatal hernia    Hypertension    Vitamin D  deficiency     History of Present Illness  Cassandra Cooke is a 83 y.o. female with a PMH of CAD, HTN, GERD, DM type II, anxiety, HLD who presents today for follow-up.   Ms. Vaquera was last seen on 09/02/2023 for follow-up and was noted to have elevated blood pressures.  She had hydralazine  increased to 100 mg twice daily was referred to the hypertension clinic.  She also noted intermittent chest pain that was controlled with Imdur  at 45 mg daily.   Patient denies chest pain, palpitations, dyspnea, PND, orthopnea, nausea, vomiting, dizziness, syncope, edema, weight gain, or early satiety.   Discussed the use of AI scribe software for clinical note transcription with the patient, who gave verbal consent to proceed.  History of Present Illness    ***Notes:   Review of Systems  Please see the history of present illness.    All other systems reviewed and are otherwise negative except as noted above.  Physical Exam    Wt Readings from Last 3 Encounters:  09/02/23 189 lb 9.6 oz (86 kg)  12/31/22 187 lb 9.6 oz (85.1 kg)  08/15/22 193 lb 6.4 oz (87.7 kg)   CD:Uyzmz were no vitals filed for this visit.,There is no height or weight on file to calculate BMI. GEN: Well nourished, well developed in no acute distress Neck: No JVD; No carotid bruits Pulmonary: Clear to auscultation without rales, wheezing or rhonchi   Cardiovascular: Normal rate. Regular rhythm. Normal S1. Normal S2.   Murmurs: There is no murmur.  ABDOMEN: Soft, non-tender, non-distended EXTREMITIES:  No edema; No deformity   EKG/LABS/ Recent Cardiac Studies   ECG personally reviewed by me today - ***  Risk Assessment/Calculations:   {Does this patient have ATRIAL FIBRILLATION?[phone]}      Lab Results  Component Value Date   WBC 7.1 07/18/2022   HGB 13.3 07/26/2022   HCT 39.0 07/26/2022   MCV 86 07/18/2022   PLT 326 07/18/2022   Lab Results  Component Value Date   CREATININE 0.73 07/22/2022   BUN 10 07/22/2022   NA 141 07/26/2022   K 3.9 07/26/2022   CL 104 07/22/2022   CO2 24 07/22/2022   Lab Results  Component Value Date   CHOL 207 (H) 11/16/2020   HDL 52.30 11/16/2020   LDLCALC 135 (H) 11/16/2020   LDLDIRECT 140.5 06/14/2010   TRIG 96.0 11/16/2020   CHOLHDL 4 11/16/2020    Lab Results  Component Value Date   HGBA1C 6.3 11/16/2020   Assessment & Plan    Assessment and Plan Assessment & Plan    1.  History of CAD: -s/p [facility] 07/26/2022 with distal RCA 80% stenosis with tortuosity and at bifurcation difficult for PCI.  2.  Essential hypertension  3. Shortness of breath: - Patient reports control  of shortness of breath with current medication management.  4.Hyperlipidemia: - Patient's last LDL cholesterol was 77      Disposition: Follow-up with Vina Gull, MD or APP in *** months {Are you ordering a CV Procedure (e.g. stress test, cath, DCCV, TEE, etc)?   Press F2        :[id number]}   Signed, Wyn Raddle, Jackee Shove, NP 12/23/2023, 6:05 PM [facility]

## 2023-12-24 ENCOUNTER — Encounter: Payer: Self-pay | Admitting: Nurse Practitioner

## 2023-12-24 ENCOUNTER — Ambulatory Visit: Attending: Cardiology | Admitting: Nurse Practitioner

## 2023-12-24 ENCOUNTER — Other Ambulatory Visit: Payer: Self-pay

## 2023-12-24 VITALS — BP 130/70 | HR 69 | Ht 63.0 in | Wt 185.0 lb

## 2023-12-24 DIAGNOSIS — E785 Hyperlipidemia, unspecified: Secondary | ICD-10-CM

## 2023-12-24 DIAGNOSIS — I2510 Atherosclerotic heart disease of native coronary artery without angina pectoris: Secondary | ICD-10-CM

## 2023-12-24 DIAGNOSIS — I10 Essential (primary) hypertension: Secondary | ICD-10-CM

## 2023-12-24 DIAGNOSIS — R0602 Shortness of breath: Secondary | ICD-10-CM

## 2023-12-24 NOTE — Patient Instructions (Addendum)
 Medication Instructions:  Your physician recommends that you continue on your current medications as directed. Please refer to the Current Medication list given to you today.  *If you need a refill on your cardiac medications before your next appointment, please call your pharmacy*  Lab Work: THIS WEEK FASTING LIPIDS AND LFTS If you have labs (blood work) drawn today and your tests are completely normal, you will receive your results only by: MyChart Message (if you have MyChart) OR A paper copy in the mail If you have any lab test that is abnormal or we need to change your treatment, we will call you to review the results.  Testing/Procedures: NONE ORDERED  Follow-Up: At [facility], you and your health needs are our priority.  As part of our continuing mission to provide you with exceptional heart care, our providers are all part of one team.  This team includes your primary Cardiologist (physician) and Advanced Practice Providers or APPs (Physician Assistants and Nurse Practitioners) who all work together to provide you with the care you need, when you need it.  Your next appointment:   6 month(s)  Provider:   Vina Gull, MD OR APP   We recommend signing up for the patient portal called MyChart.  Sign up information is provided on this After Visit Summary.  MyChart is used to connect with patients for Virtual Visits (Telemedicine).  Patients are able to view lab/test results, encounter notes, upcoming appointments, etc.  Non-urgent messages can be sent to your provider as well.   To learn more about what you can do with MyChart, go to [email or web address].   Other Instructions [facility]

## 2024-01-23 ENCOUNTER — Other Ambulatory Visit: Payer: Self-pay

## 2024-01-23 ENCOUNTER — Ambulatory Visit
Admission: RE | Admit: 2024-01-23 | Discharge: 2024-01-23 | Disposition: A | Discharge status: Home / Self Care | Source: Ambulatory Visit | Attending: Physician Assistant | Admitting: Physician Assistant

## 2024-01-23 ENCOUNTER — Ambulatory Visit (INDEPENDENT_AMBULATORY_CARE_PROVIDER_SITE_OTHER)

## 2024-01-23 VITALS — BP 138/78 | HR 60 | Temp 97.5°F | Resp 16

## 2024-01-23 DIAGNOSIS — J329 Chronic sinusitis, unspecified: Secondary | ICD-10-CM

## 2024-01-23 DIAGNOSIS — J40 Bronchitis, not specified as acute or chronic: Secondary | ICD-10-CM | POA: Diagnosis not present

## 2024-01-23 DIAGNOSIS — R051 Acute cough: Secondary | ICD-10-CM | POA: Diagnosis not present

## 2024-01-23 LAB — POC SOFIA SARS ANTIGEN FIA: SARS Coronavirus 2 Ag: NEGATIVE

## 2024-01-23 MED ORDER — DOXYCYCLINE HYCLATE 100 MG PO CAPS: 100.0000 mg | ORAL_CAPSULE | Freq: Two times a day (BID) | ORAL | 0 refills | Status: DC

## 2024-01-23 MED ORDER — BENZONATATE 100 MG PO CAPS: 100.0000 mg | ORAL_CAPSULE | Freq: Three times a day (TID) | ORAL | 0 refills | Status: DC

## 2024-01-23 NOTE — ED Provider Notes (Signed)
 [facility]    CSN: [id number] Arrival date & time: 01/23/24  1200      History   Chief Complaint No chief complaint on file.   HPI Cassandra Cooke is a 83 y.o. female.   Patient presents today with a several week history of intermittent congestion and cough symptoms.  Of the past several days this has worsened and she is not experiencing cough and congestion more regularly.  Denies any fever, chest pain, shortness of breath, nausea, vomiting.  She reports that she took an at home COVID test that was negative but then her husband took 1 and was positive so she is requesting that we do 1 in our clinic today.  She has had COVID several times with her last episode in 2023.  She has had COVID-19 vaccines and 1 booster but has not had most recent booster.  She does have a history of diabetes as well as coronary artery disease.  Denies any chronic lung conditions including asthma or COPD.  She does not smoke.  Denies any recent corticosteroids.  She denies any recent antibiotics in the past 90 days.    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   DOE (dyspnea on exertion) 07/26/2022   Joint pain 07/04/2019   Shoulder pain 12/30/2018   Vertigo 10/11/2018   Hyperglycemia 06/03/2018   Vaginal dryness 06/03/2018   Foot pain 09/01/2017   Healthcare maintenance 08/09/2016   Pain in knee joint 01/13/2015   Osteopenia 01/16/2014   Medicare annual wellness visit, subsequent 01/10/2014   Advance care planning 01/10/2014   Vitamin D  deficiency 06/14/2010   GERD 03/15/2009    UNSPECIFIED MENOPAUSAL&POSTMENOPAUSAL DISORDER 06/01/2008   ANKLE EDEMA, CHRONIC 03/15/2008   LEG CRAMPS 11/18/2006   HYPERCHOLESTEROLEMIA 10/07/2006   OBESITY 10/07/2006   Essential hypertension 10/07/2006   DIVERTICULOSIS, COLON 10/07/2006   CONSTIPATION 10/07/2006    Past Surgical History:  Procedure Laterality Date   CATARACT EXTRACTION     Bil   COLONOSCOPY     DILATION AND CURETTAGE OF UTERUS     1 miscarriage   DILATION AND CURETTAGE OF UTERUS  01/2008   vaginal bleeding (Dr. Gorge)   OOPHORECTOMY  1965   right due to cysts   OOPHORECTOMY  1971   ovarian cystectomy left   POLYPECTOMY     RIGHT/LEFT HEART CATH AND CORONARY ANGIOGRAPHY N/A 07/26/2022   Procedure: RIGHT/LEFT HEART CATH AND CORONARY ANGIOGRAPHY;  Surgeon: Swaziland, Peter M, MD;  Location: [facility] INVASIVE CV LAB;  Service: Cardiovascular;  Laterality: N/A;   VAGINAL DELIVERY     NSVD x 5    OB History   No obstetric history on file.      Home Medications    Prior to Admission medications   Medication Sig Start Date End Date Taking? Authorizing Provider  benzonatate  (TESSALON ) 100 MG capsule Take 1 capsule (100 mg total) by mouth every 8 (eight) hours. 01/23/24  Yes Orey Moure,  Verlin Uher K, PA-C  doxycycline  (VIBRAMYCIN ) 100 MG capsule Take 1 capsule (100 mg total) by mouth 2 (two) times daily. 01/23/24  Yes Elinora Weigand K, PA-C  acetaminophen  (TYLENOL ) 500 MG tablet Take 1,000 mg by mouth every 8 (eight) hours as needed for moderate pain.    [provider]  amLODipine  (NORVASC ) 10 MG tablet Take 1 tablet (10 mg total) by mouth daily. 05/18/21   Cleatus Arlyss RAMAN, MD  atorvastatin  (LIPITOR) 20 MG tablet Take 1 tablet (20 mg total) by mouth daily. 09/02/23   Wyn Jackee VEAR Mickey., NP  azelastine  (ASTELIN ) 0.1 % nasal spray Place 1 spray into both nostrils 2 (two) times daily. Use in each nostril as directed 07/10/23   Reddick, Johnathan B, NP  Blood Glucose Monitoring Suppl (ACCU-CHEK GUIDE) w/Device KIT as directed.  12/19/22   [provider]  cetirizine  (ZYRTEC ) 10 MG tablet Take 1 tablet (10 mg total) by mouth daily. Patient taking differently: Take 10 mg by mouth daily as needed for allergies. 10/26/18   Adah Corning A, FNP  Fluocinolone Acetonide 0.01 % OIL Place 1 drop into the left ear as needed. 08/19/22   [provider]  fluticasone  (FLONASE ) 50 MCG/ACT nasal spray Place 1 spray into both nostrils daily. Patient taking differently: Place 1 spray into both nostrils daily as needed for allergies. 12/14/18   Cleatus Arlyss RAMAN, MD  isosorbide  mononitrate (IMDUR ) 30 MG 24 hr tablet Take 1.5 tablets (45 mg total) by mouth daily. 09/02/23   Wyn Jackee VEAR Mickey., NP  melatonin 5 MG TABS Take 5 mg by mouth at bedtime as needed (sleep).    [provider]  metoprolol  succinate (TOPROL -XL) 50 MG 24 hr tablet TAKE 2 TABLETS BY MOUTH EVERY DAY 11/16/21   Cleatus Arlyss RAMAN, MD  omeprazole  (PRILOSEC) 40 MG capsule TAKE 1 CAPSULE BY MOUTH TWICE A DAY AS NEEDED 05/15/22   Cleatus Arlyss RAMAN, MD  Vitamin D , Ergocalciferol , (DRISDOL ) 1.25 MG (50000 UNIT) CAPS capsule Take 50,000 Units by mouth every Friday. 06/03/22   [provider]    Family History Family History  Problem Relation Age of Onset   Hypertension Mother    Heart disease Father        MI   Hypertension Father    Ulcers Brother        PUD   Alcohol abuse Sister    Alcohol abuse Sister    Colon cancer Son    Heart disease Brother    Heart attack Brother    Breast cancer Neg Hx    Esophageal cancer Neg Hx    Rectal cancer Neg Hx    Stomach cancer Neg Hx     Social History Social History   Tobacco Use   Smoking status: Former    Current packs/day: 0.00    Types: Cigarettes    Quit date: 07/16/1973    Years since quitting: 50.5   Smokeless tobacco: Never   Tobacco comments:    quit over 40 years ago  Vaping Use   Vaping status: Never Used  Substance Use Topics   Alcohol use: No   Drug use: No     Allergies    Aspirin , Hctz [hydrochlorothiazide ], and Lisinopril   Review of Systems Review of Systems  Constitutional:  Positive for activity change. Negative for appetite change, fatigue and fever.  HENT:  Positive for congestion and sinus pressure. Negative for sneezing and sore throat.   Respiratory:  Positive for cough. Negative for  shortness of breath.   Cardiovascular:  Negative for chest pain.  Gastrointestinal:  Negative for abdominal pain, diarrhea, nausea and vomiting.  Neurological:  Negative for dizziness, light-headedness and headaches.     Physical Exam Triage Vital Signs ED Triage Vitals  Encounter Vitals Group     BP 01/23/24 1218 138/78     Girls Systolic BP Percentile --      Girls Diastolic BP Percentile --      Boys Systolic BP Percentile --      Boys Diastolic BP Percentile --      Pulse Rate 01/23/24 1218 60     Resp 01/23/24 1218 16     Temp 01/23/24 1218 (!) 97.5 F (36.4 C)     Temp Source 01/23/24 1218 Oral     SpO2 01/23/24 1218 96 %     Weight --      Height --      Head Circumference --      Peak Flow --      Pain Score 01/23/24 1216 0     Pain Loc --      Pain Education --      Exclude from Growth Chart --    No data found.  Updated Vital Signs BP 138/78   Pulse 60   Temp (!) 97.5 F (36.4 C) (Oral)   Resp 16   SpO2 96%   Visual Acuity Right Eye Distance:   Left Eye Distance:   Bilateral Distance:    Right Eye Near:   Left Eye Near:    Bilateral Near:     Physical Exam Vitals reviewed.  Constitutional:      General: She is awake. She is not in acute distress.    Appearance: Normal appearance. She is well-developed. She is not ill-appearing.     Comments: Very pleasant female appears stated age in no acute distress sitting comfortably in exam room  HENT:     Head: Normocephalic and atraumatic.     Right Ear: External ear normal. There is impacted cerumen.     Left Ear: Tympanic membrane, ear canal and external ear normal. Tympanic  membrane is not erythematous or bulging.     Nose:     Right Sinus: Maxillary sinus tenderness present. No frontal sinus tenderness.     Left Sinus: Maxillary sinus tenderness and frontal sinus tenderness present.     Mouth/Throat:     Pharynx: Uvula midline. No oropharyngeal exudate or posterior oropharyngeal erythema.  Cardiovascular:     Rate and Rhythm: Normal rate and regular rhythm.     Heart sounds: Normal heart sounds, S1 normal and S2 normal. No murmur heard. Pulmonary:     Effort: Pulmonary effort is normal.     Breath sounds: Examination of the right-lower field reveals decreased breath sounds. Examination of the left-lower field reveals decreased breath sounds. Decreased breath sounds present. No wheezing, rhonchi or rales.  Psychiatric:        Behavior: Behavior is cooperative.      UC Treatments / Results  Labs (all labs ordered are listed, but only abnormal results are displayed) Labs Reviewed  POC SOFIA SARS ANTIGEN FIA    EKG   Radiology DG Chest 2 View Result Date: 01/23/2024 EXAM: 2 VIEW(S) XRAY OF THE CHEST 01/23/2024 12:34:44 PM COMPARISON: 10/02/2018 CLINICAL HISTORY: Acute cough. Cough; covid exposure. FINDINGS: LUNGS AND PLEURA: No focal pulmonary opacity. No pulmonary edema. No pleural effusion. No pneumothorax. HEART AND MEDIASTINUM: No acute abnormality of the  cardiac and mediastinal silhouettes. Aortic calcification. BONES AND SOFT TISSUES: No acute osseous abnormality. Multilevel degenerative changes of spine. IMPRESSION: 1. No acute process. 2. Aortic atherosclerosis. 3. Multilevel degenerative changes of the spine. Electronically signed by: Waddell Calk MD 01/23/2024 01:06 PM EDT RP Workstation: [id number]    Procedures Procedures (including critical care time)  Medications Ordered in UC Medications - No data to display  Initial Impression / Assessment and Plan / UC Course  I have reviewed the triage vital signs and the nursing  notes.  Pertinent labs & imaging results that were available during my care of the patient were reviewed by me and considered in my medical decision making (see chart for details).     Patient is well-appearing, afebrile, nontoxic, nontachycardic.  We discussed that given she has had symptoms for several weeks I did not think COVID testing was necessary but she was insistent on having this done for peace of mind.  It was negative but given she has had symptoms for several weeks we will cover for sinobronchitis.  Chest x-ray was obtained without acute cardiopulmonary disease.  She was started on doxycycline  100 mg twice daily for 7 days.  Discussed that she is to avoid prolonged sun exposure while on this medication.  She was given Tessalon  for cough.  We discussed that she can use over-the-counter medication including fluticasone  nasal spray, Mucinex, nasal saline/sinus rinses for additional symptom relief.  She is to rest and drink plenty of fluid.  If her symptoms not improving within a week she is to return for reevaluation.  If anything worsens she needs to be seen immediately.  Should return precautions given.  All questions answered to patient satisfaction.  Final Clinical Impressions(s) / UC Diagnoses   Final diagnoses:  Acute cough  Sinobronchitis     Discharge Instructions      You were negative for COVID and your chest x-ray did not show any evidence of pneumonia.  We are going to start an antibiotic to cover for sinus/bronchitis.  Start doxycycline  100 mg twice daily for 7 days.  Stay out of the sun while on this medication.  Use Tessalon  up to 3 times a day for cough.  I also recommend over-the-counter medication including Mucinex, nasal saline, sinus rinses, fluticasone  nasal spray to help manage her symptoms.  If you are not feeling better within a week please return for reevaluation.  If anything worsens and you have high fever, worsening cough, shortness of breath, chest pain,  nausea/vomiting interfering with oral intake you need to be seen immediately.     ED Prescriptions     Medication Sig Dispense Auth. Provider   doxycycline  (VIBRAMYCIN ) 100 MG capsule Take 1 capsule (100 mg total) by mouth 2 (two) times daily. 14 capsule Siddiq Kaluzny K, PA-C   benzonatate  (TESSALON ) 100 MG capsule Take 1 capsule (100 mg total) by mouth every 8 (eight) hours. 21 capsule Quanisha Drewry K, PA-C      PDMP not reviewed this encounter.   Sherrell Rocky POUR, PA-C 01/23/24 1314

## 2024-01-23 NOTE — Discharge Instructions (Addendum)
 You were negative for COVID and your chest x-ray did not show any evidence of pneumonia.  We are going to start an antibiotic to cover for sinus/bronchitis.  Start doxycycline  100 mg twice daily for 7 days.  Stay out of the sun while on this medication.  Use Tessalon  up to 3 times a day for cough.  I also recommend over-the-counter medication including Mucinex, nasal saline, sinus rinses, fluticasone  nasal spray to help manage her symptoms.  If you are not feeling better within a week please return for reevaluation.  If anything worsens and you have high fever, worsening cough, shortness of breath, chest pain, nausea/vomiting interfering with oral intake you need to be seen immediately.

## 2024-01-23 NOTE — ED Triage Notes (Signed)
 Pt states her husband tested positive for COVID 3 days ago. Pt c/o productive cough w/white mucous, generalized weaknessx1wk

## 2024-01-27 ENCOUNTER — Ambulatory Visit: Admitting: Internal Medicine

## 2024-01-27 ENCOUNTER — Encounter: Payer: Self-pay | Admitting: Internal Medicine

## 2024-01-27 VITALS — BP 132/80 | HR 65 | Ht 63.0 in | Wt 183.0 lb

## 2024-01-27 DIAGNOSIS — Z860101 Personal history of adenomatous and serrated colon polyps: Secondary | ICD-10-CM

## 2024-01-27 DIAGNOSIS — Z800 Family history of malignant neoplasm of digestive organs: Secondary | ICD-10-CM | POA: Diagnosis not present

## 2024-01-27 DIAGNOSIS — Z860100 Personal history of colon polyps, unspecified: Secondary | ICD-10-CM

## 2024-01-27 NOTE — Patient Instructions (Addendum)
 Please follow up as needed.  _______________________________________________________  If your blood pressure at your visit was 140/90 or greater, please contact your primary care physician to follow up on this.  _______________________________________________________  If you are age 81 or older, your body mass index should be between 23-30. Your Body mass index is 32.42 kg/m. If this is out of the aforementioned range listed, please consider follow up with your Primary Care Provider.  If you are age 105 or younger, your body mass index should be between 19-25. Your Body mass index is 32.42 kg/m. If this is out of the aformentioned range listed, please consider follow up with your Primary Care Provider.   ________________________________________________________  The [facility] providers would like to encourage you to use MYCHART to communicate with providers for non-urgent requests or questions.  Due to long hold times on the telephone, sending your provider a message by [facility] may be a faster and more efficient way to get a response.  Please allow 48 business hours for a response.  Please remember that this is for non-urgent requests.  _______________________________________________________  [facility] is using a team-based approach to care.  Your team is made up of your doctor and two to three APPS. Our APPS (Nurse Practitioners and Physician Assistants) work with your physician to ensure care continuity for you. They are fully qualified to address your health concerns and develop a treatment plan. They communicate directly with your gastroenterologist to care for you. Seeing the Advanced Practice Practitioners on your physician's team can help you by facilitating care more promptly, often allowing for earlier appointments, access to diagnostic testing, procedures, and other specialty referrals.

## 2024-01-28 ENCOUNTER — Encounter: Payer: Self-pay | Admitting: Internal Medicine

## 2024-01-28 NOTE — Progress Notes (Signed)
 HISTORY OF PRESENT ILLNESS:  Cassandra Cooke is a 83 y.o. female with past medical history as listed below.  Previous patient of Dr. Toribio Cedar prior to his retirement.  She presents today wanting to discuss whether she needs surveillance colonoscopy.  The patient's son apparently had colon cancer in his 30s.  Patient herself underwent colonoscopy in March 2014 and was found to have a diminutive adenoma.  Subsequent colonoscopy September 2019 revealed diminutive adenomas, diverticulosis and hemorrhoids.  Follow-up negative.  Patient states that she is doing well.  Her GI review of systems is negative except for GERD which is controlled with PPI and chronic stable constipation.  No bleeding or other issues.  Stable blood work.  She is active.  REVIEW OF SYSTEMS:  All non-GI ROS negative. Past Medical History:  Diagnosis Date   Allergy    Anxiety    during a period of social upheaval   Arthritis    Bloating    Cataract    Colon polyps    Diabetes mellitus type II    Borderline   Diverticulosis of colon    GERD (gastroesophageal reflux disease)    Heartburn    Hiatal hernia    Hypertension    Vitamin D  deficiency     Past Surgical History:  Procedure Laterality Date   CATARACT EXTRACTION     Bil   COLONOSCOPY     DILATION AND CURETTAGE OF UTERUS     1 miscarriage   DILATION AND CURETTAGE OF UTERUS  01/2008   vaginal bleeding (Dr. Gorge)   OOPHORECTOMY  1965   right due to cysts   OOPHORECTOMY  1971   ovarian cystectomy left   POLYPECTOMY     RIGHT/LEFT HEART CATH AND CORONARY ANGIOGRAPHY N/A 07/26/2022   Procedure: RIGHT/LEFT HEART CATH AND CORONARY ANGIOGRAPHY;  Surgeon: Swaziland, Peter M, MD;  Location: [facility] INVASIVE CV LAB;  Service: Cardiovascular;  Laterality: N/A;   VAGINAL DELIVERY     NSVD x 5    Social History JIYAH TORPEY  reports that she quit smoking about 50 years ago. Her smoking use included cigarettes. She has never used smokeless tobacco. She  reports that she does not drink alcohol and does not use drugs.  family history includes Alcohol abuse in her sister and sister; Colon cancer in her son; Heart attack in her brother; Heart disease in her brother and father; Hypertension in her father and mother; Ulcers in her brother.  Allergies  Allergen Reactions   Aspirin      GI upset with high dose   Hctz [Hydrochlorothiazide ] Other (See Comments)    cramping   Lisinopril     Causes cough       PHYSICAL EXAMINATION: Vital signs: BP 132/80   Pulse 65   Ht 5' 3 (1.6 m)   Wt 183 lb (83 kg)   BMI 32.42 kg/m   Constitutional: generally well-appearing, no acute distress Psychiatric: alert and oriented x3, cooperative Eyes: extraocular movements intact, anicteric, conjunctiva pink Mouth: oral pharynx moist, no lesions Neck: supple no lymphadenopathy Cardiovascular: heart regular rate and rhythm, no murmur Lungs: clear to auscultation bilaterally Abdomen: soft, nontender, nondistended, no obvious ascites, no peritoneal signs, normal bowel sounds, no organomegaly Rectal: Omitted Extremities: no clubbing, cyanosis, or lower extremity edema bilaterally Skin: no lesions on visible extremities Neuro: No focal deficits.  Cranial nerves intact  ASSESSMENT:  24.  83 year old with history of nonadvanced adenomas and reported history of colon cancer in her son at  age 26.  Previous colonoscopies as described.  Most recently 2019.   PLAN:  1.  I discussed with patient in great detail the role of surveillance colonoscopy.  We applied this specifically regarding her personal history of polyps, her family history of cancer, her age, and current functional status.  It was my opinion that the patient could undergo surveillance colonoscopy if she felt strongly.  It was also my opinion, that the potential benefit, at this point, was likely low.  As well consideration for procedural risk.  As such, I was also comfortable with foregoing  colonoscopy.  She has no signs or symptoms to consider otherwise.  To this end, she has decided not to proceed with colonoscopy.  Again, I support this decision.  She will return to the care of her primary care provider.  GI follow-up as needed. Total time of 35 minutes was spent preparing to see the patient, obtaining comprehensive history, performing medically appropriate physical examination, counseling and educating the patient regarding the above listed issues, and documenting clinical information in the health record

## 2024-04-06 ENCOUNTER — Ambulatory Visit (HOSPITAL_COMMUNITY)
Admission: EM | Admit: 2024-04-06 | Discharge: 2024-04-06 | Disposition: A | Discharge status: Home / Self Care | Attending: Family Medicine | Admitting: Family Medicine

## 2024-04-06 ENCOUNTER — Encounter (HOSPITAL_COMMUNITY): Payer: Self-pay | Admitting: Emergency Medicine

## 2024-04-06 DIAGNOSIS — R531 Weakness: Secondary | ICD-10-CM

## 2024-04-06 DIAGNOSIS — R42 Dizziness and giddiness: Secondary | ICD-10-CM

## 2024-04-06 NOTE — ED Triage Notes (Signed)
 Pt reports left ear pain and dizziness for couple weeks. Tried putting drops in ear.

## 2024-04-06 NOTE — ED Notes (Addendum)
 Patient is being discharged from the Urgent Care and sent to the Emergency Department via POV . Per Sharlet Linden, MD, patient is in need of higher level of care due to vertigo, weakness. Patient is aware and verbalizes understanding of plan of care.  Vitals:   04/06/24 0904  BP: (!) 151/75  Pulse: 65  Resp: 18  Temp: 99.1 F (37.3 C)  SpO2: 97%

## 2024-04-06 NOTE — ED Provider Notes (Signed)
 [facility]    CSN: [id number] Arrival date & time: 04/06/24  9170      History   Chief Complaint Chief Complaint  Patient presents with   Otalgia    HPI Cassandra Cooke is a 83 y.o. female.    Otalgia  Here for dizziness and vertigo and weakness and left ear pain.  Symptoms began about 2 weeks ago.  She has had lightheadedness and a spinning sensation.  She also states that her legs feel weak and she has nearly fallen due to the leg weakness. Her left ear has been bothering her persistently for the last 2 weeks.  She has been nauseated some, but is not vomiting.  She states she was able to eat well for Thanksgiving.  No constipation or diarrhea.  No persistent cough or congestion and no dysuria.  She has some chronic ongoing right flank or side pain.  She is allergic to lisinopril and hydrochlorothiazide  and aspirin .    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   DOE (dyspnea on exertion) 07/26/2022   Joint pain 07/04/2019   Shoulder pain 12/30/2018   Vertigo 10/11/2018   Hyperglycemia 06/03/2018   Vaginal dryness 06/03/2018   Foot pain 09/01/2017   Healthcare maintenance 08/09/2016   Pain in knee joint 01/13/2015   Osteopenia 01/16/2014   Medicare annual wellness visit, subsequent 01/10/2014   Advance care planning 01/10/2014   Vitamin D  deficiency 06/14/2010   GERD 03/15/2009   UNSPECIFIED MENOPAUSAL&POSTMENOPAUSAL DISORDER 06/01/2008   ANKLE EDEMA, CHRONIC 03/15/2008   LEG CRAMPS 11/18/2006   HYPERCHOLESTEROLEMIA 10/07/2006   OBESITY  10/07/2006   Essential hypertension 10/07/2006   DIVERTICULOSIS, COLON 10/07/2006   CONSTIPATION 10/07/2006    Past Surgical History:  Procedure Laterality Date   CATARACT EXTRACTION     Bil   COLONOSCOPY     DILATION AND CURETTAGE OF UTERUS     1 miscarriage   DILATION AND CURETTAGE OF UTERUS  01/2008   vaginal bleeding (Dr. Gorge)   OOPHORECTOMY  1965   right due to cysts   OOPHORECTOMY  1971   ovarian cystectomy left   POLYPECTOMY     RIGHT/LEFT HEART CATH AND CORONARY ANGIOGRAPHY N/A 07/26/2022   Procedure: RIGHT/LEFT HEART CATH AND CORONARY ANGIOGRAPHY;  Surgeon: Jordan, Peter M, MD;  Location: [facility] INVASIVE CV LAB;  Service: Cardiovascular;  Laterality: N/A;   VAGINAL DELIVERY     NSVD x 5    OB History   No obstetric history on file.      Home Medications    Prior to Admission medications   Medication Sig Start Date End Date Taking? Authorizing Provider  acetaminophen  (TYLENOL ) 500 MG tablet Take 1,000 mg by mouth every 8 (eight) hours as needed for moderate pain.    [provider]  amLODipine  (NORVASC ) 10 MG tablet Take 1 tablet (10 mg total) by mouth daily. 05/18/21   Cleatus Arlyss RAMAN, MD  atorvastatin  (LIPITOR) 20  MG tablet Take 1 tablet (20 mg total) by mouth daily. 09/02/23   Wyn Jackee VEAR Mickey., NP  azelastine  (ASTELIN ) 0.1 % nasal spray Place 1 spray into both nostrils 2 (two) times daily. Use in each nostril as directed 07/10/23   Reddick, Johnathan B, NP  Blood Glucose Monitoring Suppl (ACCU-CHEK GUIDE) w/Device KIT as directed. 12/19/22   [provider]  cetirizine  (ZYRTEC ) 10 MG tablet Take 1 tablet (10 mg total) by mouth daily. Patient taking differently: Take 10 mg by mouth daily as needed for allergies. 10/26/18   Adah Corning A, FNP  Fluocinolone Acetonide 0.01 % OIL Place 1 drop into the left ear as needed. 08/19/22   [provider]  fluticasone  (FLONASE ) 50 MCG/ACT nasal spray Place 1 spray into both nostrils daily. Patient  taking differently: Place 1 spray into both nostrils daily as needed for allergies. 12/14/18   Cleatus Arlyss RAMAN, MD  isosorbide  mononitrate (IMDUR ) 30 MG 24 hr tablet Take 1.5 tablets (45 mg total) by mouth daily. 09/02/23   Wyn Jackee VEAR Mickey., NP  melatonin 5 MG TABS Take 5 mg by mouth at bedtime as needed (sleep).    [provider]  metoprolol  succinate (TOPROL -XL) 50 MG 24 hr tablet TAKE 2 TABLETS BY MOUTH EVERY DAY 11/16/21   Cleatus Arlyss RAMAN, MD  omeprazole  (PRILOSEC) 40 MG capsule TAKE 1 CAPSULE BY MOUTH TWICE A DAY AS NEEDED 05/15/22   Cleatus Arlyss RAMAN, MD  Vitamin D , Ergocalciferol , (DRISDOL ) 1.25 MG (50000 UNIT) CAPS capsule Take 50,000 Units by mouth every Friday. 06/03/22   [provider]    Family History Family History  Problem Relation Age of Onset   Hypertension Mother    Heart disease Father        MI   Hypertension Father    Ulcers Brother        PUD   Alcohol abuse Sister    Alcohol abuse Sister    Colon cancer Son    Heart disease Brother    Heart attack Brother    Breast cancer Neg Hx    Esophageal cancer Neg Hx    Rectal cancer Neg Hx    Stomach cancer Neg Hx     Social History Social History   Tobacco Use   Smoking status: Former    Current packs/day: 0.00    Types: Cigarettes    Quit date: 07/16/1973    Years since quitting: 50.7   Smokeless tobacco: Never   Tobacco comments:    quit over 40 years ago  Vaping Use   Vaping status: Never Used  Substance Use Topics   Alcohol use: No   Drug use: No     Allergies   Aspirin , Hctz [hydrochlorothiazide ], and Lisinopril   Review of Systems Review of Systems  HENT:  Positive for ear pain.      Physical Exam Triage Vital Signs ED Triage Vitals  Encounter Vitals Group     BP 04/06/24 0904 (!) 151/75     Girls Systolic BP Percentile --      Girls Diastolic BP Percentile --      Boys Systolic BP Percentile --      Boys Diastolic BP Percentile --      Pulse Rate 04/06/24 0904 65      Resp 04/06/24 0904 18     Temp 04/06/24 0904 99.1 F (37.3 C)     Temp Source 04/06/24 0904 Oral     SpO2 04/06/24 0904 97 %  Weight --      Height --      Head Circumference --      Peak Flow --      Pain Score 04/06/24 0903 8     Pain Loc --      Pain Education --      Exclude from Growth Chart --    No data found.  Updated Vital Signs BP (!) 151/75 (BP Location: Left Arm)   Pulse 65   Temp 99.1 F (37.3 C) (Oral)   Resp 18   SpO2 97%   Visual Acuity Right Eye Distance:   Left Eye Distance:   Bilateral Distance:    Right Eye Near:   Left Eye Near:    Bilateral Near:     Physical Exam Vitals reviewed.  Constitutional:      General: She is not in acute distress.    Appearance: She is not ill-appearing, toxic-appearing or diaphoretic.  HENT:     Ears:     Comments: Right tympanic membrane is obscured by some cerumen.  Left tympanic membrane is shiny but with some opaque white discoloration of the upper and posterior portion of the tympanic membrane.  No erythema.    Nose: Nose normal.     Mouth/Throat:     Mouth: Mucous membranes are moist.     Pharynx: No oropharyngeal exudate or posterior oropharyngeal erythema.  Eyes:     Conjunctiva/sclera: Conjunctivae normal.     Pupils: Pupils are equal, round, and reactive to light.     Comments: There is some nystagmus on lateral gaze, especially to the left.  Cardiovascular:     Rate and Rhythm: Normal rate and regular rhythm.     Heart sounds: No murmur heard. Pulmonary:     Effort: No respiratory distress.     Breath sounds: No stridor. No wheezing, rhonchi or rales.  Musculoskeletal:     Cervical back: Neck supple.  Lymphadenopathy:     Cervical: No cervical adenopathy.  Skin:    Capillary Refill: Capillary refill takes less than 2 seconds.     Coloration: Skin is not jaundiced or pale.  Neurological:     General: No focal deficit present.     Mental Status: She is alert and oriented to person,  place, and time.  Psychiatric:        Behavior: Behavior normal.      UC Treatments / Results  Labs (all labs ordered are listed, but only abnormal results are displayed) Labs Reviewed - No data to display  EKG   Radiology No results found.  Procedures Procedures (including critical care time)  Medications Ordered in UC Medications - No data to display  Initial Impression / Assessment and Plan / UC Course  I have reviewed the triage vital signs and the nursing notes.  Pertinent labs & imaging results that were available during my care of the patient were reviewed by me and considered in my medical decision making (see chart for details).     I am concerned that she needs some urgent lab evaluation to assess her symptoms.  She will go to the emergency room with her husband driving her by private vehicle. Final Clinical Impressions(s) / UC Diagnoses   Final diagnoses:  Dizziness  Vertigo  Weakness     Discharge Instructions      She will go to the emergency room for further evaluation and treatment     ED Prescriptions   None    PDMP  not reviewed this encounter.   Vonna Sharlet POUR, MD 04/06/24 [phone]

## 2024-04-06 NOTE — Discharge Instructions (Signed)
 She will go to the emergency room for further evaluation and treatment

## 2024-04-08 ENCOUNTER — Other Ambulatory Visit: Payer: Self-pay | Admitting: Registered Nurse

## 2024-04-08 DIAGNOSIS — R42 Dizziness and giddiness: Secondary | ICD-10-CM

## 2024-04-12 ENCOUNTER — Inpatient Hospital Stay: Admission: RE | Admit: 2024-04-12

## 2024-04-13 ENCOUNTER — Inpatient Hospital Stay: Admission: RE | Admit: 2024-04-13 | Discharge: 2024-04-13 | Attending: Registered Nurse

## 2024-04-13 DIAGNOSIS — R42 Dizziness and giddiness: Secondary | ICD-10-CM

## 2024-06-29 ENCOUNTER — Ambulatory Visit: Admitting: Physician Assistant
